# Patient Record
Sex: Female | Born: 1965 | Race: White | Hispanic: No | Smoking: Current every day smoker
Health system: Southern US, Community
[De-identification: ages and names within clinical notes are randomized; demographics above are authoritative.]

## PROBLEM LIST (undated history)

## (undated) DIAGNOSIS — F419 Anxiety disorder, unspecified: Secondary | ICD-10-CM

## (undated) DIAGNOSIS — M1990 Unspecified osteoarthritis, unspecified site: Secondary | ICD-10-CM

## (undated) DIAGNOSIS — R32 Unspecified urinary incontinence: Secondary | ICD-10-CM

## (undated) DIAGNOSIS — Z8679 Personal history of other diseases of the circulatory system: Secondary | ICD-10-CM

## (undated) DIAGNOSIS — E079 Disorder of thyroid, unspecified: Secondary | ICD-10-CM

## (undated) DIAGNOSIS — I10 Essential (primary) hypertension: Secondary | ICD-10-CM

## (undated) DIAGNOSIS — M797 Fibromyalgia: Secondary | ICD-10-CM

## (undated) DIAGNOSIS — E7800 Pure hypercholesterolemia, unspecified: Secondary | ICD-10-CM

## (undated) HISTORY — PX: ANTERIOR FUSION CERVICAL SPINE: SUR626

## (undated) HISTORY — PX: TUBAL LIGATION: SHX77

## (undated) HISTORY — PX: TONSILLECTOMY: SUR1361

## (undated) HISTORY — PX: BACK SURGERY: SHX140

## (undated) HISTORY — PX: CHOLECYSTECTOMY: SHX55

---

## 2012-12-18 MED ORDER — GUAIFENESIN-CODEINE 100-10 MG/5ML PO SYRP
100-10 MG/5ML | Freq: Four times a day (QID) | ORAL | Status: AC | PRN
Start: 2012-12-18 — End: 2012-12-25

## 2012-12-18 MED ORDER — BENZONATATE 100 MG PO CAPS
100 MG | ORAL_CAPSULE | Freq: Three times a day (TID) | ORAL | Status: AC | PRN
Start: 2012-12-18 — End: 2012-12-25

## 2012-12-18 NOTE — Progress Notes (Signed)
[facility] [place]  [address]  [place] [address]  Dept: [phone]  Dept Fax: [phone]    Tracy Ortega is a 47 y.o. female who presents to the urgent care today for her medical conditions/complaints as noted below.  [name] is c/o of Cough      HPI:     Cough  This is a new problem. The current episode started more than 1 month ago. The problem has been gradually worsening. The problem occurs constantly. The cough is productive of sputum. Associated symptoms include wheezing (tightness in chest). Associated symptoms comments: Fatigue, ear & sinus pressure, body aches. Risk factors for lung disease include smoking/tobacco exposure. She has tried OTC cough suppressant (albuterol breathing treatment) for the symptoms. The treatment provided mild relief. Her past medical history is significant for bronchitis, environmental allergies and pneumonia.       No past medical history on file.     Current Outpatient Prescriptions   Medication Sig Dispense Refill   ??? guaiFENesin-codeine (TUSSI-ORGANIDIN NR) 100-10 MG/5ML syrup Take 5 mLs by mouth 4 times daily as needed for Cough for up to 7 days.  280 mL  0   ??? benzonatate (TESSALON PERLES) 100 MG capsule Take 1 capsule by mouth 3 times daily as needed for Cough for up to 7 days.  30 capsule  0   ??? citalopram (CELEXA) 20 MG tablet Take 20 mg by mouth daily.       ??? Levothyroxine Sodium 75 MCG CAPS Take 1 capsule by mouth Daily.       ??? traMADol (ULTRAM) 50 MG tablet Take 50 mg by mouth every 8 hours as needed for Pain.       ??? fluticasone (FLONASE) 50 MCG/ACT nasal spray 1 spray by Nasal route daily.         No current facility-administered medications for this visit.     Allergies   Allergen Reactions   ??? Ancef [Cefazolin] Hives   ??? Demerol Hcl [Meperidine] Other (See Comments)     psycosis   ??? Seasonal Other (See Comments)     Sinus infection, sneezing   ??? Keflex [Cephalexin] Nausea And Vomiting              Subjective:      Review of Systems   Respiratory: Positive for cough and wheezing (tightness in chest).    Allergic/Immunologic: Positive for environmental allergies.   Cardiac: negative  HEENT: positive for congestion and runny nose.  Constitutional: Positive for myalgias      Objective:     Physical Exam   Constitutional: She is oriented to person, place, and time. She appears well-developed and well-nourished. No distress.   HENT:   Head: Normocephalic and atraumatic.   Nose: Mucosal edema and rhinorrhea present. No sinus tenderness or nasal deformity. No epistaxis. Right sinus exhibits no maxillary sinus tenderness and no frontal sinus tenderness. Left sinus exhibits no maxillary sinus tenderness and no frontal sinus tenderness.   Eyes: Conjunctivae and EOM are normal. Pupils are equal, round, and reactive to [place]. Right eye exhibits no discharge. Left eye exhibits no discharge. No scleral icterus.   Neck: Normal range of motion. Neck supple. No JVD present. No tracheal deviation present. No thyromegaly present.   Cardiovascular: Normal rate, regular rhythm and intact distal pulses.  Exam reveals no gallop and no friction rub.    No murmur heard.  Pulmonary/Chest: Effort normal and breath sounds normal. No respiratory  distress. She exhibits no tenderness.   Abdominal: Soft. Bowel sounds are normal. She exhibits no distension and no mass. There is no tenderness. There is no rebound.   Genitourinary: Vagina normal and uterus normal. No vaginal discharge found.   Musculoskeletal: Normal range of motion. She exhibits no edema or tenderness.   Lymphadenopathy:     She has no cervical adenopathy.   Neurological: She is alert and oriented to person, place, and time. She has normal reflexes. No cranial nerve deficit. She exhibits normal muscle tone.   Skin: Skin is warm and dry. No rash noted. No erythema.   Psychiatric: She has a normal mood and affect. Her behavior is normal. Thought content normal.   Nursing  note and vitals reviewed.    BP 100/62   Pulse 90   Temp(Src) 98.7 ??F (37.1 ??C) (Oral)   Ht 5' 2.5" (1.588 m)   Wt 140 lb (63.504 kg)   BMI 25.18 kg/m2   LMP 12/09/2012   Breastfeeding? No       Assessment:      1. Cough  XR Chest Standard TWO VW       Plan:      No Follow-up on file.    Orders Placed This Encounter   Medications   ??? guaiFENesin-codeine (TUSSI-ORGANIDIN NR) 100-10 MG/5ML syrup     Sig: Take 5 mLs by mouth 4 times daily as needed for Cough for up to 7 days.     Dispense:  280 mL     Refill:  0   ??? benzonatate (TESSALON PERLES) 100 MG capsule     Sig: Take 1 capsule by mouth 3 times daily as needed for Cough for up to 7 days.     Dispense:  30 capsule     Refill:  0       X ray negative, will send on to radiology for review.  Patient given educational materials - see patient instructions.  Discussed use, benefit, and side effects of prescribed medications.  All patient questions answered.  Pt voiced understanding.    Electronically signed by Hetty Ely, NP on 12/18/2012 at 8:08 PM

## 2013-11-17 MED ORDER — BENZONATATE 200 MG PO CAPS
200 MG | ORAL_CAPSULE | Freq: Three times a day (TID) | ORAL | Status: AC | PRN
Start: 2013-11-17 — End: 2013-11-24

## 2013-11-17 MED ORDER — AZITHROMYCIN 250 MG PO TABS
250 MG | PACK | ORAL | Status: AC
Start: 2013-11-17 — End: 2013-11-27

## 2013-11-17 MED ORDER — ALBUTEROL SULFATE (2.5 MG/3ML) 0.083% IN NEBU
Freq: Four times a day (QID) | RESPIRATORY_TRACT | Status: AC | PRN
Start: 2013-11-17 — End: ?

## 2013-11-17 MED ORDER — PREDNISONE 20 MG PO TABS
20 MG | ORAL_TABLET | ORAL | Status: AC
Start: 2013-11-17 — End: 2013-11-27

## 2013-11-17 NOTE — Progress Notes (Signed)
[facility] [place]  [address]  [place] [address]  Dept: [phone]  Dept Fax: [phone]    Tracy Ortega is a 48 y.o. female who presents to the urgent care today for her medical conditions/complaints as noted below.  Tracy Ortega is c/o of Sinusitis; and Cough      HPI:     Sinusitis  This is a new problem. The current episode started in the past 7 days. The problem has been gradually worsening since onset. The pain is mild. Associated symptoms include congestion, coughing, headaches, a hoarse voice, sinus pressure and a sore throat. Pertinent negatives include no chills, diaphoresis, ear pain, neck pain, shortness of breath, sneezing or swollen glands. Past treatments include oral decongestants. The treatment provided mild relief.   Cough  This is a new problem. The current episode started in the past 7 days. The problem has been gradually worsening. The problem occurs every few minutes. Associated symptoms include headaches, nasal congestion, postnasal drip and a sore throat. Pertinent negatives include no chest pain, chills, ear pain, fever, rash, rhinorrhea or shortness of breath. She has tried OTC cough suppressant for the symptoms. The treatment provided mild relief.       No past medical history on file.     Current Outpatient Prescriptions   Medication Sig Dispense Refill   ??? metoprolol (TOPROL-XL) 25 MG XL tablet Take 25 mg by mouth 2 times daily       ??? simvastatin (ZOCOR) 40 MG tablet Take 40 mg by mouth nightly       ??? aspirin 81 MG tablet Take 81 mg by mouth daily       ??? azithromycin (ZITHROMAX) 250 MG tablet 2 tablets now then 1 daily until gone.  1 packet  0   ??? benzonatate (TESSALON) 200 MG capsule Take 1 capsule by mouth 3 times daily as needed for Cough  20 capsule  0   ??? predniSONE (DELTASONE) 20 MG tablet 3 tabs x 3 days, then 2 tabs x 3 days, then 1 tab x 3 days  18 tablet  0   ??? albuterol (PROVENTIL) (2.5 MG/3ML) 0.083%  nebulizer solution Take 3 mLs by nebulization every 6 hours as needed for Wheezing or Shortness of Breath  120 vial  0   ??? citalopram (CELEXA) 20 MG tablet Take 20 mg by mouth daily.       ??? Levothyroxine Sodium 75 MCG CAPS Take 1 capsule by mouth Daily.       ??? traMADol (ULTRAM) 50 MG tablet Take 50 mg by mouth every 8 hours as needed for Pain.       ??? fluticasone (FLONASE) 50 MCG/ACT nasal spray 1 spray by Nasal route daily.         No current facility-administered medications for this visit.     Allergies   Allergen Reactions   ??? Ancef [Cefazolin] Hives   ??? Demerol Hcl [Meperidine] Other (See Comments)     psycosis   ??? Seasonal Other (See Comments)     Sinus infection, sneezing   ??? Keflex [Cephalexin] Nausea And Vomiting       Subjective:      Review of Systems   Constitutional: Negative for fever, chills, diaphoresis and fatigue.   HENT: Positive for congestion, hoarse voice, postnasal drip, sinus pressure and sore throat. Negative for ear discharge, ear pain, rhinorrhea and sneezing.    Eyes: Negative for discharge and itching.   Respiratory:  Positive for cough. Negative for chest tightness and shortness of breath.    Cardiovascular: Negative for chest pain, palpitations and leg swelling.   Gastrointestinal: Negative for nausea, vomiting, abdominal pain and diarrhea.   Genitourinary: Negative for dysuria and frequency.   Musculoskeletal: Negative for neck pain and neck stiffness.   Skin: Negative for rash.   Neurological: Positive for headaches. Negative for dizziness, weakness, Mehra-headedness and numbness.   All other systems reviewed and are negative.      Objective:     Physical Exam   Constitutional: She is oriented to person, place, and time. She appears well-developed and well-nourished. No distress.   BP 139/76 mmHg   Pulse 72   Temp(Src) 99 ??F (37.2 ??C) (Tympanic)   Resp 18   Ht  (1.575 m)   Wt 140 lb (63.504 kg)   BMI 25.60 kg/m2   LMP 12/09/2012 (Approximate)     HENT:   Head: Normocephalic and  atraumatic.   Right Ear: External ear normal.   Left Ear: External ear normal.   Nose: Nose normal.   Mouth/Throat: Oropharynx is clear and moist.   Eyes: Conjunctivae and EOM are normal. Pupils are equal, round, and reactive to [place]. Right eye exhibits no discharge. Left eye exhibits no discharge. No scleral icterus.   Neck: Normal range of motion. Neck supple. No tracheal deviation present. No thyromegaly present.   Cardiovascular: Normal rate, regular rhythm and normal heart sounds.  Exam reveals no gallop and no friction rub.    No murmur heard.  Pulmonary/Chest: Effort normal. No stridor. No respiratory distress. She has no wheezes. She has no rales. She exhibits no tenderness.   Decreased BS post   Abdominal: Soft. Bowel sounds are normal. She exhibits no distension. There is no tenderness. There is no rebound and no guarding.   Musculoskeletal: She exhibits no edema.   Neurological: She is alert and oriented to person, place, and time. Gait normal.   Skin: Skin is warm and dry. No rash noted. She is not diaphoretic.   Psychiatric: She has a normal mood and affect. Her affect is not inappropriate.   Nursing note and vitals reviewed.    BP 139/76 mmHg   Pulse 72   Temp(Src) 99 ??F (37.2 ??C) (Tympanic)   Resp 18   Ht  (1.575 m)   Wt 140 lb (63.504 kg)   BMI 25.60 kg/m2   LMP 12/09/2012 (Approximate)    Assessment:      1. COPD exacerbation (HCC)        Plan:      No Follow-up on file.    Orders Placed This Encounter   Medications   ??? azithromycin (ZITHROMAX) 250 MG tablet     Sig: 2 tablets now then 1 daily until gone.     Dispense:  1 packet     Refill:  0   ??? benzonatate (TESSALON) 200 MG capsule     Sig: Take 1 capsule by mouth 3 times daily as needed for Cough     Dispense:  20 capsule     Refill:  0   ??? predniSONE (DELTASONE) 20 MG tablet     Sig: 3 tabs x 3 days, then 2 tabs x 3 days, then 1 tab x 3 days     Dispense:  18 tablet     Refill:  0   ??? albuterol (PROVENTIL) (2.5 MG/3ML) 0.083% nebulizer  solution     Sig: Take 3 mLs by  nebulization every 6 hours as needed for Wheezing or Shortness of Breath     Dispense:  120 vial     Refill:  0          STOP SMOKING    Patient given educational materials - see patient instructions.  Discussed use, benefit, and side effects of prescribed medications.  All patient questions answered.  Pt voiced understanding.    Electronically signed by Lonny Prude, PA, PA-C on 11/17/2013 at 2:32 PM

## 2014-02-16 LAB — COMPREHENSIVE METABOLIC PANEL
ALT: 16 U/L (ref 5–33)
AST: 14 U/L (ref <32)
Albumin/Globulin Ratio: 1.6 (ref 1.0–2.5)
Albumin: 3.9 g/dL (ref 3.5–5.2)
Alkaline Phosphatase: 69 U/L (ref 35–104)
Anion Gap: 16 mmol/L (ref 9–17)
BUN: 8 mg/dL (ref 6–20)
CO2: 27 mmol/L (ref 20–31)
Calcium: 8.9 mg/dL (ref 8.6–10.4)
Chloride: 95 mmol/L — ABNORMAL LOW (ref 98–107)
Creatinine: 0.81 mg/dL (ref 0.50–0.90)
GFR African American: >60 mL/min (ref >60)
GFR Non-African American: >60 mL/min (ref >60)
Glucose: 81 mg/dL (ref 70–99)
Potassium: 4.5 mmol/L (ref 3.7–5.3)
Sodium: 138 mmol/L (ref 135–144)
Total Bilirubin: 0.35 mg/dL (ref 0.3–1.2)
Total Protein: 6.4 g/dL (ref 6.4–8.3)

## 2014-02-16 LAB — CBC
Hematocrit: 43.4 % (ref 36–46)
Hemoglobin: 14.4 g/dL (ref 12.0–16.0)
MCH: 34.7 pg — ABNORMAL HIGH (ref 26–34)
MCHC: 33.2 g/dL (ref 31–37)
MCV: 104.7 fL — ABNORMAL HIGH (ref 80–100)
MPV: 8.8 fL (ref 6.0–12.0)
Platelets: 321 10*3/uL (ref 140–450)
RBC: 4.15 m/uL (ref 4.0–5.2)
RDW: 14 % (ref 12.5–15.4)
WBC: 8.8 10*3/uL (ref 3.5–11.0)

## 2014-02-16 LAB — TSH WITH REFLEX: TSH: 2.06 mIU/L (ref 0.30–5.00)

## 2014-02-16 LAB — VITAMIN D 25 HYDROXY: Vit D, 25-Hydroxy: 12.5 ng/mL — ABNORMAL LOW (ref 30.0–100.0)

## 2014-03-12 LAB — LIPID PANEL
Chol/HDL Ratio: 3.2 (ref <5)
Cholesterol: 136 mg/dL (ref <200)
HDL: 42 mg/dL (ref >40)
LDL Cholesterol: 59 mg/dL (ref 0–130)
Triglycerides: 173 mg/dL — ABNORMAL HIGH (ref <150)

## 2014-03-12 LAB — CBC WITH AUTO DIFFERENTIAL
Absolute Eos #: 0.2 10*3/uL (ref 0.0–0.4)
Absolute Lymph #: 2.6 10*3/uL (ref 1.0–4.8)
Absolute Mono #: 0.5 10*3/uL (ref 0.1–1.2)
Basophils Absolute: 0 10*3/uL (ref 0.0–0.2)
Basophils: 0 % (ref 0–2)
Eosinophils %: 2 % (ref 1–4)
Hematocrit: 40.4 % (ref 36–46)
Hemoglobin: 13.4 g/dL (ref 12.0–16.0)
Lymphocytes: 27 % (ref 24–44)
MCH: 34 pg (ref 26–34)
MCHC: 33.3 g/dL (ref 31–37)
MCV: 101.9 fL — ABNORMAL HIGH (ref 80–100)
MPV: 9.2 fL (ref 6.0–12.0)
Monocytes: 5 % (ref 2–11)
Platelets: 332 10*3/uL (ref 140–450)
RBC: 3.96 m/uL — ABNORMAL LOW (ref 4.0–5.2)
RDW: 13.6 % (ref 12.5–15.4)
Seg Neutrophils: 66 % (ref 36–66)
Segs Absolute: 6.2 10*3/uL (ref 1.8–7.7)
WBC: 9.6 10*3/uL (ref 3.5–11.0)

## 2014-03-12 LAB — SURGICAL PATHOLOGY

## 2014-03-12 LAB — SEDIMENTATION RATE: Sed Rate: 17 mm (ref 0–20)

## 2014-03-12 LAB — RHEUMATOID FACTOR: Rheumatoid Factor: <10 IU/mL (ref <14)

## 2014-03-12 LAB — RETICULOCYTES
Absolute Retic #: 0.078 M/uL (ref 0.0245–0.098)
Retic %: 2 % (ref 0.5–2.0)

## 2014-03-12 LAB — ANA

## 2014-03-12 LAB — VITAMIN B12: Vitamin B-12: 282 pg/mL (ref 211–946)

## 2014-03-12 LAB — TRANSFERRIN: Transferrin: 284 mg/dL (ref 200–360)

## 2014-03-12 LAB — PERIPHERAL BLOOD SMEAR, PATH REVIEW

## 2014-03-12 LAB — FOLATE: Folate: 5.9 ng/mL (ref >4.7)

## 2014-04-09 LAB — ANA

## 2014-04-09 LAB — T4, FREE: Thyroxine, Free: 1.23 ng/dL (ref 0.93–1.70)

## 2014-04-09 LAB — TSH WITH REFLEX: TSH: 6.9 mIU/L — ABNORMAL HIGH (ref 0.30–5.00)

## 2014-07-16 DIAGNOSIS — R059 Cough, unspecified: Secondary | ICD-10-CM | POA: Insufficient documentation

## 2014-07-16 DIAGNOSIS — R5382 Chronic fatigue, unspecified: Secondary | ICD-10-CM | POA: Insufficient documentation

## 2014-07-16 DIAGNOSIS — R768 Other specified abnormal immunological findings in serum: Secondary | ICD-10-CM | POA: Insufficient documentation

## 2014-07-16 DIAGNOSIS — M2550 Pain in unspecified joint: Secondary | ICD-10-CM | POA: Insufficient documentation

## 2014-09-15 LAB — LYME AB: Lyme Ab: 0.44 (ref <0.91)

## 2016-01-19 ENCOUNTER — Emergency Department
Admission: EM | Admit: 2016-01-19 | Discharge: 2016-01-19 | Disposition: A | Discharge status: Other Acute Inpatient Hospital | Payer: 59 | Attending: Emergency Medicine | Admitting: Emergency Medicine

## 2016-01-19 ENCOUNTER — Inpatient Hospital Stay
Admission: EM | Admit: 2016-01-19 | Discharge: 2016-02-07 | DRG: 885 | Disposition: A | Discharge status: Home / Self Care | Payer: No Typology Code available for payment source | Source: Intra-hospital | Attending: Psychiatry | Admitting: Psychiatry

## 2016-01-19 ENCOUNTER — Encounter: Payer: Self-pay | Admitting: *Deleted

## 2016-01-19 DIAGNOSIS — Z5181 Encounter for therapeutic drug level monitoring: Secondary | ICD-10-CM | POA: Diagnosis not present

## 2016-01-19 DIAGNOSIS — I10 Essential (primary) hypertension: Secondary | ICD-10-CM | POA: Insufficient documentation

## 2016-01-19 DIAGNOSIS — E785 Hyperlipidemia, unspecified: Secondary | ICD-10-CM | POA: Diagnosis present

## 2016-01-19 DIAGNOSIS — F3163 Bipolar disorder, current episode mixed, severe, without psychotic features: Secondary | ICD-10-CM | POA: Diagnosis not present

## 2016-01-19 DIAGNOSIS — I2510 Atherosclerotic heart disease of native coronary artery without angina pectoris: Secondary | ICD-10-CM | POA: Diagnosis present

## 2016-01-19 DIAGNOSIS — F1020 Alcohol dependence, uncomplicated: Secondary | ICD-10-CM

## 2016-01-19 DIAGNOSIS — F17200 Nicotine dependence, unspecified, uncomplicated: Secondary | ICD-10-CM

## 2016-01-19 DIAGNOSIS — E538 Deficiency of other specified B group vitamins: Secondary | ICD-10-CM | POA: Diagnosis present

## 2016-01-19 DIAGNOSIS — E039 Hypothyroidism, unspecified: Secondary | ICD-10-CM | POA: Diagnosis present

## 2016-01-19 DIAGNOSIS — G4700 Insomnia, unspecified: Secondary | ICD-10-CM | POA: Diagnosis present

## 2016-01-19 DIAGNOSIS — F419 Anxiety disorder, unspecified: Secondary | ICD-10-CM | POA: Diagnosis present

## 2016-01-19 DIAGNOSIS — N390 Urinary tract infection, site not specified: Secondary | ICD-10-CM | POA: Diagnosis present

## 2016-01-19 DIAGNOSIS — M549 Dorsalgia, unspecified: Secondary | ICD-10-CM | POA: Diagnosis present

## 2016-01-19 DIAGNOSIS — F29 Unspecified psychosis not due to a substance or known physiological condition: Secondary | ICD-10-CM | POA: Diagnosis not present

## 2016-01-19 DIAGNOSIS — Z79899 Other long term (current) drug therapy: Secondary | ICD-10-CM

## 2016-01-19 DIAGNOSIS — F10239 Alcohol dependence with withdrawal, unspecified: Secondary | ICD-10-CM | POA: Diagnosis present

## 2016-01-19 DIAGNOSIS — F17210 Nicotine dependence, cigarettes, uncomplicated: Secondary | ICD-10-CM | POA: Diagnosis present

## 2016-01-19 DIAGNOSIS — F1120 Opioid dependence, uncomplicated: Secondary | ICD-10-CM | POA: Diagnosis present

## 2016-01-19 DIAGNOSIS — Z7982 Long term (current) use of aspirin: Secondary | ICD-10-CM

## 2016-01-19 DIAGNOSIS — F1210 Cannabis abuse, uncomplicated: Secondary | ICD-10-CM | POA: Diagnosis present

## 2016-01-19 DIAGNOSIS — F250 Schizoaffective disorder, bipolar type: Secondary | ICD-10-CM | POA: Diagnosis present

## 2016-01-19 DIAGNOSIS — G8929 Other chronic pain: Secondary | ICD-10-CM | POA: Diagnosis present

## 2016-01-19 DIAGNOSIS — F10939 Alcohol use, unspecified with withdrawal, unspecified: Secondary | ICD-10-CM

## 2016-01-19 DIAGNOSIS — E781 Pure hyperglyceridemia: Secondary | ICD-10-CM | POA: Diagnosis present

## 2016-01-19 DIAGNOSIS — R451 Restlessness and agitation: Secondary | ICD-10-CM | POA: Diagnosis present

## 2016-01-19 DIAGNOSIS — Z01818 Encounter for other preprocedural examination: Secondary | ICD-10-CM | POA: Diagnosis present

## 2016-01-19 HISTORY — DX: Anxiety disorder, unspecified: F41.9

## 2016-01-19 HISTORY — DX: Personal history of other diseases of the circulatory system: Z86.79

## 2016-01-19 HISTORY — DX: Disorder of thyroid, unspecified: E07.9

## 2016-01-19 HISTORY — DX: Unspecified urinary incontinence: R32

## 2016-01-19 HISTORY — DX: Essential (primary) hypertension: I10

## 2016-01-19 LAB — COMPREHENSIVE METABOLIC PANEL
ALT: 25 U/L (ref 14–54)
ANION GAP: 11 (ref 5–15)
AST: 37 U/L (ref 15–41)
Albumin: 4.2 g/dL (ref 3.5–5.0)
Alkaline Phosphatase: 77 U/L (ref 38–126)
BUN: <5 mg/dL — ABNORMAL LOW (ref 6–20)
CHLORIDE: 100 mmol/L — AB (ref 101–111)
CO2: 27 mmol/L (ref 22–32)
CREATININE: 1.12 mg/dL — AB (ref 0.44–1.00)
Calcium: 9.3 mg/dL (ref 8.9–10.3)
GFR, EST NON AFRICAN AMERICAN: 56 mL/min — AB (ref >60)
Glucose, Bld: 137 mg/dL — ABNORMAL HIGH (ref 65–99)
POTASSIUM: 4.7 mmol/L (ref 3.5–5.1)
SODIUM: 138 mmol/L (ref 135–145)
Total Bilirubin: 0.7 mg/dL (ref 0.3–1.2)
Total Protein: 7.4 g/dL (ref 6.5–8.1)

## 2016-01-19 LAB — URINALYSIS COMPLETE WITH MICROSCOPIC (ARMC ONLY)
Bilirubin Urine: NEGATIVE
Glucose, UA: NEGATIVE mg/dL
KETONES UR: NEGATIVE mg/dL
LEUKOCYTES UA: NEGATIVE
NITRITE: POSITIVE — AB
PROTEIN: NEGATIVE mg/dL
SPECIFIC GRAVITY, URINE: 1.009 (ref 1.005–1.030)
pH: 6 (ref 5.0–8.0)

## 2016-01-19 LAB — CBC
HCT: 45.5 % (ref 35.0–47.0)
Hemoglobin: 15.8 g/dL (ref 12.0–16.0)
MCH: 35.6 pg — AB (ref 26.0–34.0)
MCHC: 34.6 g/dL (ref 32.0–36.0)
MCV: 102.9 fL — AB (ref 80.0–100.0)
PLATELETS: 346 10*3/uL (ref 150–440)
RBC: 4.42 MIL/uL (ref 3.80–5.20)
RDW: 14.8 % — AB (ref 11.5–14.5)
WBC: 15.7 10*3/uL — ABNORMAL HIGH (ref 3.6–11.0)

## 2016-01-19 LAB — URINE DRUG SCREEN, QUALITATIVE (ARMC ONLY)
AMPHETAMINES, UR SCREEN: NOT DETECTED
Barbiturates, Ur Screen: NOT DETECTED
Benzodiazepine, Ur Scrn: NOT DETECTED
CANNABINOID 50 NG, UR ~~LOC~~: POSITIVE — AB
COCAINE METABOLITE, UR ~~LOC~~: NOT DETECTED
MDMA (ECSTASY) UR SCREEN: NOT DETECTED
METHADONE SCREEN, URINE: NOT DETECTED
Opiate, Ur Screen: NOT DETECTED
Phencyclidine (PCP) Ur S: NOT DETECTED
TRICYCLIC, UR SCREEN: NOT DETECTED

## 2016-01-19 LAB — ACETAMINOPHEN LEVEL: Acetaminophen (Tylenol), Serum: <10 ug/mL — ABNORMAL LOW (ref 10–30)

## 2016-01-19 LAB — SALICYLATE LEVEL: Salicylate Lvl: <7 mg/dL (ref 2.8–30.0)

## 2016-01-19 LAB — ETHANOL

## 2016-01-19 MED ORDER — HYDROXYZINE HCL 25 MG PO TABS
25.0000 mg | ORAL_TABLET | Freq: Three times a day (TID) | ORAL | Status: DC | PRN
  Administered 2016-01-19 – 2016-01-20 (×2): 25 mg via ORAL
  Filled 2016-01-19 (×2): qty 1

## 2016-01-19 MED ORDER — DIPHENHYDRAMINE HCL 25 MG PO CAPS: 25.0000 mg | ORAL_CAPSULE | Freq: Four times a day (QID) | ORAL | Status: DC | PRN

## 2016-01-19 MED ORDER — PALIPERIDONE ER 3 MG PO TB24
6.0000 mg | ORAL_TABLET | Freq: Every day | ORAL | Status: DC
  Administered 2016-01-19 – 2016-01-23 (×5): 6 mg via ORAL
  Filled 2016-01-19 (×5): qty 2

## 2016-01-19 MED ORDER — FOLIC ACID 1 MG PO TABS
1.0000 mg | ORAL_TABLET | Freq: Every day | ORAL | Status: DC
  Administered 2016-01-20: 1 mg via ORAL
  Filled 2016-01-19: qty 1

## 2016-01-19 MED ORDER — SIMVASTATIN 20 MG PO TABS
20.0000 mg | ORAL_TABLET | Freq: Every day | ORAL | Status: DC
  Administered 2016-01-20 – 2016-02-06 (×17): 20 mg via ORAL
  Filled 2016-01-19 (×17): qty 1

## 2016-01-19 MED ORDER — IBUPROFEN 400 MG PO TABS
400.0000 mg | ORAL_TABLET | Freq: Four times a day (QID) | ORAL | Status: DC | PRN
  Administered 2016-01-19 – 2016-02-02 (×22): 400 mg via ORAL
  Filled 2016-01-19 (×22): qty 1

## 2016-01-19 MED ORDER — ASPIRIN EC 81 MG PO TBEC
81.0000 mg | DELAYED_RELEASE_TABLET | Freq: Every day | ORAL | Status: DC
  Administered 2016-01-20 – 2016-02-07 (×20): 81 mg via ORAL
  Filled 2016-01-19 (×19): qty 1

## 2016-01-19 MED ORDER — CITALOPRAM HYDROBROMIDE 20 MG PO TABS
20.0000 mg | ORAL_TABLET | Freq: Every day | ORAL | Status: DC
  Administered 2016-01-20: 20 mg via ORAL
  Filled 2016-01-19: qty 1

## 2016-01-19 MED ORDER — ALUM & MAG HYDROXIDE-SIMETH 200-200-20 MG/5ML PO SUSP
30.0000 mL | ORAL | Status: DC | PRN
  Administered 2016-01-22 – 2016-02-05 (×6): 30 mL via ORAL
  Filled 2016-01-19 (×6): qty 30

## 2016-01-19 MED ORDER — ADULT MULTIVITAMIN W/MINERALS CH
1.0000 | ORAL_TABLET | Freq: Every day | ORAL | Status: DC
  Administered 2016-01-20: 1 via ORAL
  Filled 2016-01-19: qty 1

## 2016-01-19 MED ORDER — ACETAMINOPHEN 325 MG PO TABS
650.0000 mg | ORAL_TABLET | Freq: Four times a day (QID) | ORAL | Status: DC | PRN
  Administered 2016-02-01 – 2016-02-06 (×2): 650 mg via ORAL
  Filled 2016-01-19 (×2): qty 2

## 2016-01-19 MED ORDER — LORAZEPAM 1 MG PO TABS
1.0000 mg | ORAL_TABLET | Freq: Once | ORAL | Status: AC
  Administered 2016-01-19: 1 mg via ORAL
  Filled 2016-01-19: qty 1

## 2016-01-19 MED ORDER — LORAZEPAM 1 MG PO TABS
1.0000 mg | ORAL_TABLET | Freq: Four times a day (QID) | ORAL | Status: DC | PRN
  Administered 2016-01-20: 1 mg via ORAL
  Filled 2016-01-19: qty 1

## 2016-01-19 MED ORDER — ASPIRIN EC 81 MG PO TBEC
81.0000 mg | DELAYED_RELEASE_TABLET | Freq: Every day | ORAL | Status: DC
  Administered 2016-01-19: 81 mg via ORAL
  Filled 2016-01-19: qty 1

## 2016-01-19 MED ORDER — MAGNESIUM HYDROXIDE 400 MG/5ML PO SUSP: 30.0000 mL | Freq: Every day | ORAL | Status: DC | PRN

## 2016-01-19 MED ORDER — TRAZODONE HCL 100 MG PO TABS
100.0000 mg | ORAL_TABLET | Freq: Every evening | ORAL | Status: DC | PRN
  Administered 2016-01-20 – 2016-02-05 (×14): 100 mg via ORAL
  Filled 2016-01-19 (×16): qty 1

## 2016-01-19 MED ORDER — SULFAMETHOXAZOLE-TRIMETHOPRIM 800-160 MG PO TABS
1.0000 | ORAL_TABLET | Freq: Two times a day (BID) | ORAL | Status: DC
  Administered 2016-01-19 – 2016-01-20 (×2): 1 via ORAL
  Filled 2016-01-19 (×2): qty 1

## 2016-01-19 MED ORDER — LORAZEPAM 2 MG/ML IJ SOLN: 1.0000 mg | Freq: Four times a day (QID) | INTRAMUSCULAR | Status: DC | PRN

## 2016-01-19 MED ORDER — CITALOPRAM HYDROBROMIDE 20 MG PO TABS
20.0000 mg | ORAL_TABLET | Freq: Every day | ORAL | Status: DC
  Administered 2016-01-19: 20 mg via ORAL
  Filled 2016-01-19: qty 1

## 2016-01-19 MED ORDER — THIAMINE HCL 100 MG/ML IJ SOLN: 100.0000 mg | Freq: Every day | INTRAMUSCULAR | Status: DC

## 2016-01-19 MED ORDER — PALIPERIDONE ER 6 MG PO TB24
6.0000 mg | ORAL_TABLET | Freq: Every day | ORAL | Status: DC
  Filled 2016-01-19: qty 1

## 2016-01-19 MED ORDER — VITAMIN B-1 100 MG PO TABS
100.0000 mg | ORAL_TABLET | Freq: Every day | ORAL | Status: DC
  Administered 2016-01-20: 100 mg via ORAL
  Filled 2016-01-19: qty 1

## 2016-01-19 MED ORDER — SIMVASTATIN 20 MG PO TABS
20.0000 mg | ORAL_TABLET | Freq: Every day | ORAL | Status: DC
  Filled 2016-01-19: qty 1

## 2016-01-19 NOTE — ED Provider Notes (Signed)
[facility] Emergency Department Provider Note  ____________________________________________  Time seen: Approximately 2:40 PM  I have reviewed the triage vital signs and the nursing notes.   HISTORY  Chief Complaint Medical Clearance   HPI Catherine Lester is a 50 y.o. female h/o PTSD and depression who presents with police for auditory hallucinations and abnormal behavior. Patient with disorganized thought process, tells me that she is here to help her daughter who kicked her out of the house and she has been living in a motel. She does not know who long she has been there. She has been hearing voices and reports that this has happened to her in the past. She says the voices tell her "to follow my gut instinct". She also tells me "I have a great sixth sense and I can fell what is going on". She denies SI and reports prior h/o SI. Denies prior psych hospitalization. She is from [place]. She endorses alcohol use but is unable to tell me how much or how often. When I asked her what meds she was on, she started to list her meds and then said "someone over there said I am on ativan, but I am not" and pointed to her left side as if someone was there. She also endorses MJ use but denies other drugs.   Past Medical History:  Diagnosis Date  . Anxiety   . Hx of heart valve insufficiency   . Hypertension   . Thyroid disease   . Urinary incontinence     There are no active problems to display for this patient.   Past Surgical History:  Procedure Laterality Date  . ANTERIOR FUSION CERVICAL SPINE     C5, C6  . BACK SURGERY    . TONSILLECTOMY    . TUBAL LIGATION      Prior to Admission medications   Not on File    Allergies Ancef [cefazolin]; Ceclor [cefaclor]; Demerol [meperidine]; and Keflex [cephalexin]  No family history on file.  Social History Social History  Substance Use Topics  . Smoking status: Current Every Day Smoker  . Smokeless tobacco:  Never Used  . Alcohol use Yes     Comment: occasional    Review of Systems  Constitutional: Negative for fever. Eyes: Negative for visual changes. ENT: Negative for sore throat. Cardiovascular: Negative for chest pain. Respiratory: Negative for shortness of breath. Gastrointestinal: Negative for abdominal pain, vomiting or diarrhea. Genitourinary: Negative for dysuria. Musculoskeletal: Negative for back pain. Skin: Negative for rash. Neurological: Negative for headaches, weakness or numbness. Psych: + AH, denies SI  ____________________________________________   PHYSICAL EXAM:  VITAL SIGNS: ED Triage Vitals  Enc Vitals Group     BP 01/19/16 1415 (!) 168/85     Pulse Rate 01/19/16 1415 (!) 110     Resp 01/19/16 1415 18     Temp 01/19/16 1415 98.4 F (36.9 C)     Temp Source 01/19/16 1415 Oral     SpO2 01/19/16 1415 96 %     Weight 01/19/16 1413 183 lb (83 kg)     Height 01/19/16 1413 5\' 2"  (1.575 m)     Head Circumference --      Peak Flow --      Pain Score --      Pain Loc --      Pain Edu? --      Excl. in GC? --     Constitutional: Alert and oriented. Well appearing and in no apparent distress.  HEENT:      Head: Normocephalic and atraumatic.         Eyes: Conjunctivae are normal. Sclera is non-icteric. EOMI. PERRL      Mouth/Throat: Mucous membranes are moist.       Neck: Supple with no signs of meningismus. Cardiovascular: Regular rate and rhythm. No murmurs, gallops, or rubs. 2+ symmetrical distal pulses are present in all extremities. No JVD. Respiratory: Normal respiratory effort. Lungs are clear to auscultation bilaterally. No wheezes, crackles, or rhonchi.  Gastrointestinal: Soft, non tender, and non distended with positive bowel sounds. No rebound or guarding. Genitourinary: No CVA tenderness. Musculoskeletal: Nontender with normal range of motion in all extremities. No edema, cyanosis, or erythema of extremities. Neurologic: Normal speech and  language. Face is symmetric. Moving all extremities. No gross focal neurologic deficits are appreciated. Skin: Skin is warm, dry and intact. No rash noted. Psychiatric: Disorganized thought process, auditory hallucinations, no SI   ____________________________________________   LABS (all labs ordered are listed, but only abnormal results are displayed)  Labs Reviewed  CBC  COMPREHENSIVE METABOLIC PANEL  ETHANOL  URINE DRUG SCREEN, QUALITATIVE ([facility] ONLY)  URINALYSIS COMPLETEWITH MICROSCOPIC ([facility] ONLY)  SALICYLATE LEVEL  ACETAMINOPHEN LEVEL   ____________________________________________  EKG  none  ____________________________________________  RADIOLOGY  none  ____________________________________________   PROCEDURES  Procedure(s) performed: None Procedures Critical Care performed:  None ____________________________________________   INITIAL IMPRESSION / ASSESSMENT AND PLAN / ED COURSE  50 y.o. female h/o PTSD and depression who presents with police for auditory hallucinations and abnormal behavior. Patient with active psychosis at this time. Will initiate IVC paperwork. Will check labs, tox screen, and consult psych.   Clinical Course    Pertinent labs & imaging results that were available during my care of the patient were reviewed by me and considered in my medical decision making (see chart for details).    ____________________________________________   FINAL CLINICAL IMPRESSION(S) / ED DIAGNOSES  Final diagnoses:  Psychosis, unspecified psychosis type      NEW MEDICATIONS STARTED DURING THIS VISIT:  New Prescriptions   No medications on file     Note:  This document was prepared using Dragon voice recognition software and may include unintentional dictation errors.    Nita Sicklearolina Magdaline Zollars, MD 01/20/16 [phone]

## 2016-01-19 NOTE — BH Assessment (Signed)
Assessment Note  Catherine Lester is an 50 y.o. female who presents to the ER via law enforcement from local motel, because she was having odd and bizarre behaviors.  Patient admits to A/H. Patient also admits to using alcohol and cannabis. Per her report, she came to [place], from [place] to help her daughter [name]).  Patient denies history of Psych Admissions and outpatient treatment. Patient was a poor historian and was unable to provide much information. During the interview, she was having thought blocking and at times became confused. During the interview, the patient was calm, polite and pleasant.  Per the report of the patient's mother Consuella Lose Fuller-[phone]), she's had great deal of stressors in the last several months. She confirmed the patient came to [place] to help her daughter's current living arrangements and with her current boyfriend. She was suppose return this past Friday (01/14/2016) but decided to stay a few more days with her daughter. Mother reports of having limited information about what happened with the daughter and the patient.   Patient came to [facility], approximately a month ago. Mother has talked with her on a daily basis, except for today (01/19/2016). Patient is the primary care giver for both of her parents and her fraternal aunt who is handicap. Patient was a LPN for approximately 30 years. Mother confirmed the patient has had no psych inpatient admissions. She was unsure of her outpatient treatment.  Patient denies HI and V/H.  Diagnosis: Bipolar  Past Medical History:  Past Medical History:  Diagnosis Date  . Anxiety   . Hx of heart valve insufficiency   . Hypertension   . Thyroid disease   . Urinary incontinence     Past Surgical History:  Procedure Laterality Date  . ANTERIOR FUSION CERVICAL SPINE     C5, C6  . BACK SURGERY    . TONSILLECTOMY    . TUBAL LIGATION      Family History: No family history on file.  Social  History:  reports that she has been smoking.  She has never used smokeless tobacco. She reports that she drinks alcohol. She reports that she uses drugs, including Marijuana.  Additional Social History:  Alcohol / Drug Use Pain Medications: See PTA Prescriptions: See PTA Over the Counter: See PTA History of alcohol / drug use?: Yes Longest period of sobriety (when/how long): Unknown Negative Consequences of Use: Financial (None Reported) Withdrawal Symptoms:  (None Reported) Substance #1 Name of Substance 1: Cannabis Substance #2 Name of Substance 2: Alcohol  CIWA: CIWA-Ar BP: (!) 168/85 Pulse Rate: (!) 110 COWS:    Allergies:  Allergies  Allergen Reactions  . Ancef [Cefazolin] Hives  . Ceclor [Cefaclor] Hives  . Demerol [Meperidine] Other (See Comments)    insomnia  . Keflex [Cephalexin] Rash    Home Medications:  (Not in a hospital admission)  OB/GYN Status:  No LMP recorded. Patient is not currently having periods (Reason: Perimenopausal).  General Assessment Data Location of Assessment: [facility] ED TTS Assessment: In system Is this a Tele or Face-to-Face Assessment?: Face-to-Face Is this an Initial Assessment or a Re-assessment for this encounter?: Initial Assessment Marital status: Married [place] name: n/a Is patient pregnant?: No Pregnancy Status: No Living Arrangements: Spouse/significant other, Children Can pt return to current living arrangement?: Yes Admission Status: Involuntary Is patient capable of signing voluntary admission?: No Referral Source: Self/Family/Friend [place]) Insurance type: Unknown  Medical Screening Exam ([facility] Walk-in ONLY) Medical Exam completed: Yes  Crisis Care Plan Living Arrangements:  Spouse/significant other, Children Legal Guardian: Other: (None) Name of Psychiatrist: Unknown Name of Therapist: Unknown  Education Status Is patient currently in school?: No Current Grade: n/a Highest grade of school patient has  completed: Unknown Name of school: n/a Contact person: n/a  Risk to self with the past 6 months Suicidal Ideation: No Has patient been a risk to self within the past 6 months prior to admission? : No Suicidal Intent: No Has patient had any suicidal intent within the past 6 months prior to admission? : No Is patient at risk for suicide?: No Suicidal Plan?: No Has patient had any suicidal plan within the past 6 months prior to admission? : No Access to Means: No What has been your use of drugs/alcohol within the last 12 months?: Alcohol & Cannabis Previous Attempts/Gestures: No How many times?: 0 Other Self Harm Risks: Reports of none Triggers for Past Attempts: Unknown Intentional Self Injurious Behavior: None Family Suicide History: No Recent stressful life event(s): Other (Comment) Persecutory voices/beliefs?: No Depression: Yes Depression Symptoms: Fatigue, Isolating, Feeling worthless/self pity Substance abuse history and/or treatment for substance abuse?: Yes Suicide prevention information given to non-admitted patients: Not applicable  Risk to Others within the past 6 months Homicidal Ideation: No Does patient have any lifetime risk of violence toward others beyond the six months prior to admission? : No Thoughts of Harm to Others: No Current Homicidal Intent: No Current Homicidal Plan: No Access to Homicidal Means: No Identified Victim: Reports of none History of harm to others?: No Violent Behavior Description: Reports of none Does patient have access to weapons?: No Does patient have a court date: No Is patient on probation?: No  Psychosis Hallucinations: Auditory Delusions: None noted  Mental Status Report Appearance/Hygiene: Bizarre Eye Contact: Fair Motor Activity: Freedom of movement, Unremarkable Speech: Soft, Slurred, Slow Level of Consciousness: Alert Mood: Labile, Pleasant, Helpless Affect: Labile, Anxious Anxiety Level: Minimal Thought Processes:  Thought Blocking, Flight of Ideas, Relevant Judgement: Partial Orientation: Person, Situation, Appropriate for developmental age Obsessive Compulsive Thoughts/Behaviors: Minimal  Cognitive Functioning Concentration: Decreased Memory: Recent Impaired, Remote Intact IQ: Average Insight: Poor Impulse Control: Poor Appetite: Fair Weight Loss: 0 Weight Gain: 0 Sleep: Unable to Assess Total Hours of Sleep: 0 (Unknown) Vegetative Symptoms: None  ADLScreening [facility] Assessment Services) Patient's cognitive ability adequate to safely complete daily activities?: Yes Patient able to express need for assistance with ADLs?: Yes Independently performs ADLs?: Yes (appropriate for developmental age)  Prior Inpatient Therapy Prior Inpatient Therapy: No Prior Therapy Dates: Reports of none Prior Therapy Facilty/Provider(s): Reports of none Reason for Treatment: Reports of none  Prior Outpatient Therapy Prior Outpatient Therapy: No Prior Therapy Dates: Reports of none Prior Therapy Facilty/Provider(s): Reports of none Reason for Treatment: Reports of none Does patient have an ACCT team?: No Does patient have Intensive In-House Services?  : No Does patient have [facility] services? : No Does patient have P4CC services?: No  ADL Screening (condition at time of admission) Patient's cognitive ability adequate to safely complete daily activities?: Yes Is the patient deaf or have difficulty hearing?: No Does the patient have difficulty seeing, even when wearing glasses/contacts?: No Does the patient have difficulty concentrating, remembering, or making decisions?: No Patient able to express need for assistance with ADLs?: Yes Does the patient have difficulty dressing or bathing?: No Independently performs ADLs?: Yes (appropriate for developmental age) Does the patient have difficulty walking or climbing stairs?: No Weakness of Legs: None Weakness of Arms/Hands: None  Home Assistive  Devices/Equipment Home  Assistive Devices/Equipment: None  Therapy Consults (therapy consults require a physician order) PT Evaluation Needed: No OT Evalulation Needed: No SLP Evaluation Needed: No Abuse/Neglect Assessment (Assessment to be complete while patient is alone) Physical Abuse: Denies Verbal Abuse: Denies Sexual Abuse: Denies Exploitation of patient/patient's resources: Denies Self-Neglect: Denies Values / Beliefs Cultural Requests During Hospitalization: None Spiritual Requests During Hospitalization: None Consults Spiritual Care Consult Needed: No Social Work Consult Needed: No Merchant navy officerAdvance Directives (For Healthcare) Does patient have an advance directive?: No    Additional Information 1:1 In Past 12 Months?: No CIRT Risk: No Elopement Risk: No Does patient have medical clearance?: Yes  Child/Adolescent Assessment Running Away Risk: Denies (Patient is an adult)  Disposition:  Disposition Initial Assessment Completed for this Encounter: Yes Disposition of Patient: Other dispositions (ER MD ordered Psych Consult)  On Site Evaluation by:   Reviewed with Physician:    Lilyan Gilfordalvin J. Annaliese Saez MS, LCAS, LPC, NCC, CCSI Therapeutic Triage Specialist 01/19/2016 4:28 PM

## 2016-01-19 NOTE — ED Notes (Signed)
The money in the patient's wallet was counted in front of the patient and ODS Marcheta GrammesSargeant Brown. Sargeant Brown placed wallet and money and credit cards in an envelope, sealed it and took it to the hospital's safe.

## 2016-01-19 NOTE — Consult Note (Signed)
[facility] Consult   Reason for Consult:  Consult for 50 year old woman brought in by police after disruptive behavior in public Referring Physician:  Dorathy Daft Patient Identification: Catherine Lester MRN:  [id number] Principal Diagnosis: Bipolar 1 disorder, mixed, severe ([place]) Diagnosis:   Patient Active Problem List   Diagnosis Date Noted  . Bipolar 1 disorder, mixed, severe ([place]) [F31.63] 01/19/2016  . Alcohol abuse [F10.10] 01/19/2016  . Marijuana abuse [F12.10] 01/19/2016    Total Time spent with patient: 1 hour  Subjective:   Catherine Lester is a 50 y.o. female patient admitted with "things of been terrible".  HPI:  Patient interviewed. Chart reviewed. Labs reviewed. Case discussed with TTS and emergency room physician. 50 year old woman brought to the emergency room by police after they were called to a local motel regarding some kind of disruptive behavior. Patient was very limited in her ability to give history. She was able to tell us the name of her daughter but not her daughter's phone number and we don't have any other contact information. Patient says that she lives in [place] and 3 weeks ago drove down here to [place] to "help" her daughter. She says that since she's been here things have been "terrible". She can't describe exactly what she means by that. Evidently at some point she left her daughter and went to stay in a motel. Patient is very disorganized about any other details of the history. She says she was taking psychiatric medicine up in [place] although she can't remember what it was. She does not think she's been taking her medicine regularly since coming down here. She says that she has been drinking "a lot". I ask her to specify and she said it's been several of the canned mixed drinks a day. She says she also smokes marijuana but is vague about how much of that. Denies other drug use. Patient says she has been sleeping very poorly. Appetite has  been poor. Thoughts of been confused. She has been having auditory hallucinations. She was not able to describe them or give me any other details.  Medical history: Patient apparently has a chronic pain problem and dyslipidemia. Couldn't give me much other detail about medical history. Thyroid disease is listed in her list of problems but she didn't mention being on thyroid medicine. Denies any history of heart attack.  Substance abuse history: Says that she drinks and sometimes drinks a lot. This is another area she is vague about. Denies ever having had seizures or hallucinations from withdrawal. She uses marijuana regularly but no other drugs.  Social history: Patient said that she was living with her mother and some other relatives up in [place]. She says she was "supposed to" be receiving disability. She hasn't been able to work is at least several years. She mentions having a son but does not know where he is. Apparently came down here to be with her daughter but we don't have enough information to contact her.  Past Psychiatric History: Patient clearly has had mental health problems in the past but she can't remember a diagnosis. When I mentioned the words bipolar disorder however she seemed to connect with that. She denied to me ever being in a psychiatric hospital. Denied any history of suicide attempts. Said that she has been aggressive in the past but only when she needed to "defend herself".  Risk to Self: Is patient at risk for suicide?: No Risk to Others:   Prior Inpatient Therapy:   Prior Outpatient Therapy:  Past Medical History:  Past Medical History:  Diagnosis Date  . Anxiety   . Hx of heart valve insufficiency   . Hypertension   . Thyroid disease   . Urinary incontinence     Past Surgical History:  Procedure Laterality Date  . ANTERIOR FUSION CERVICAL SPINE     C5, C6  . BACK SURGERY    . TONSILLECTOMY    . TUBAL LIGATION     Family History: No family history on  file. Family Psychiatric  History: Patient says that she thinks there is a family history of mental illness but again she can't come up with any details. Social History:  History  Alcohol Use  . Yes    Comment: occasional     History  Drug Use  . Types: Marijuana    Social History   Social History  . Marital status: Single    Spouse name: N/A  . Number of children: N/A  . Years of education: N/A   Social History Main Topics  . Smoking status: Current Every Day Smoker  . Smokeless tobacco: Never Used  . Alcohol use Yes     Comment: occasional  . Drug use:     Types: Marijuana  . Sexual activity: No   Other Topics Concern  . None   Social History Narrative  . None   Additional Social History:    Allergies:   Allergies  Allergen Reactions  . Ancef [Cefazolin] Hives  . Ceclor [Cefaclor] Hives  . Demerol [Meperidine] Other (See Comments)    insomnia  . Keflex [Cephalexin] Rash    Labs:  Results for orders placed or performed during the hospital encounter of 01/19/16 (from the past 48 hour(s))  CBC     Status: Abnormal   Collection Time: 01/19/16  2:22 PM  Result Value Ref Range   WBC 15.7 (H) 3.6 - 11.0 K/uL   RBC 4.42 3.80 - 5.20 MIL/uL   Hemoglobin 15.8 12.0 - 16.0 g/dL   HCT 45.5 35.0 - 47.0 %   MCV 102.9 (H) 80.0 - 100.0 fL   MCH 35.6 (H) 26.0 - 34.0 pg   MCHC 34.6 32.0 - 36.0 g/dL   RDW 14.8 (H) 11.5 - 14.5 %   Platelets 346 150 - 440 K/uL  Comprehensive metabolic panel     Status: Abnormal   Collection Time: 01/19/16  2:22 PM  Result Value Ref Range   Sodium 138 135 - 145 mmol/L   Potassium 4.7 3.5 - 5.1 mmol/L   Chloride 100 (L) 101 - 111 mmol/L   CO2 27 22 - 32 mmol/L   Glucose, Bld 137 (H) 65 - 99 mg/dL   BUN <5 (L) 6 - 20 mg/dL   Creatinine, Ser 1.12 (H) 0.44 - 1.00 mg/dL   Calcium 9.3 8.9 - 10.3 mg/dL   Total Protein 7.4 6.5 - 8.1 g/dL   Albumin 4.2 3.5 - 5.0 g/dL   AST 37 15 - 41 U/L   ALT 25 14 - 54 U/L   Alkaline Phosphatase 77 38  - 126 U/L   Total Bilirubin 0.7 0.3 - 1.2 mg/dL   GFR calc non Af Amer 56 (L) >60 mL/min   GFR calc Af Amer >60 >60 mL/min    Comment: (NOTE) The eGFR has been calculated using the CKD EPI equation. This calculation has not been validated in all clinical situations. eGFR's persistently <60 mL/min signify possible Chronic Kidney Disease.    Anion gap 11 5 - 15  Ethanol     Status: None   Collection Time: 01/19/16  2:22 PM  Result Value Ref Range   Alcohol, Ethyl (B) <5 <5 mg/dL    Comment:        LOWEST DETECTABLE LIMIT FOR SERUM ALCOHOL IS 5 mg/dL FOR MEDICAL PURPOSES ONLY   Urine Drug Screen, Qualitative ([facility] only)     Status: Abnormal   Collection Time: 01/19/16  2:22 PM  Result Value Ref Range   Tricyclic, Ur Screen NONE DETECTED NONE DETECTED   Amphetamines, Ur Screen NONE DETECTED NONE DETECTED   MDMA (Ecstasy)Ur Screen NONE DETECTED NONE DETECTED   Cocaine Metabolite,Ur [place] NONE DETECTED NONE DETECTED   Opiate, Ur Screen NONE DETECTED NONE DETECTED   Phencyclidine (PCP) Ur S NONE DETECTED NONE DETECTED   Cannabinoid 50 Ng, Ur [place] POSITIVE (A) NONE DETECTED   Barbiturates, Ur Screen NONE DETECTED NONE DETECTED   Benzodiazepine, Ur Scrn NONE DETECTED NONE DETECTED   Methadone Scn, Ur NONE DETECTED NONE DETECTED    Comment: (NOTE) 242  Tricyclics, urine               Cutoff 1000 ng/mL 200  Amphetamines, urine             Cutoff 1000 ng/mL 300  MDMA (Ecstasy), urine           Cutoff 500 ng/mL 400  Cocaine Metabolite, urine       Cutoff 300 ng/mL 500  Opiate, urine                   Cutoff 300 ng/mL 600  Phencyclidine (PCP), urine      Cutoff 25 ng/mL 700  Cannabinoid, urine              Cutoff 50 ng/mL 800  Barbiturates, urine             Cutoff 200 ng/mL 900  Benzodiazepine, urine           Cutoff 200 ng/mL 1000 Methadone, urine                Cutoff 300 ng/mL 1100 1200 The urine drug screen provides only a preliminary, unconfirmed 1300 analytical test result and  should not be used for non-medical 1400 purposes. Clinical consideration and professional judgment should 1500 be applied to any positive drug screen result due to possible 1600 interfering substances. A more specific alternate chemical method 1700 must be used in order to obtain a confirmed analytical result.  1800 Gas chromato graphy / mass spectrometry (GC/MS) is the preferred 1900 confirmatory method.   Urinalysis complete, with microscopic ([facility] only)     Status: Abnormal   Collection Time: 01/19/16  2:22 PM  Result Value Ref Range   Color, Urine YELLOW (A) YELLOW   APPearance HAZY (A) CLEAR   Glucose, UA NEGATIVE NEGATIVE mg/dL   Bilirubin Urine NEGATIVE NEGATIVE   Ketones, ur NEGATIVE NEGATIVE mg/dL   Specific Gravity, Urine 1.009 1.005 - 1.030   Hgb urine dipstick 1+ (A) NEGATIVE   pH 6.0 5.0 - 8.0   Protein, ur NEGATIVE NEGATIVE mg/dL   Nitrite POSITIVE (A) NEGATIVE   Leukocytes, UA NEGATIVE NEGATIVE   RBC / HPF 0-5 0 - 5 RBC/hpf   WBC, UA 0-5 0 - 5 WBC/hpf   Bacteria, UA RARE (A) NONE SEEN   Squamous Epithelial / LPF 0-5 (A) NONE SEEN   Mucous PRESENT   Salicylate level     Status: None   Collection  Time: 01/19/16  2:22 PM  Result Value Ref Range   Salicylate Lvl <3.7 2.8 - 30.0 mg/dL  Acetaminophen level     Status: Abnormal   Collection Time: 01/19/16  2:22 PM  Result Value Ref Range   Acetaminophen (Tylenol), Serum <10 (L) 10 - 30 ug/mL    Comment:        THERAPEUTIC CONCENTRATIONS VARY SIGNIFICANTLY. A RANGE OF 10-30 ug/mL MAY BE AN EFFECTIVE CONCENTRATION FOR MANY PATIENTS. HOWEVER, SOME ARE BEST TREATED AT CONCENTRATIONS OUTSIDE THIS RANGE. ACETAMINOPHEN CONCENTRATIONS >150 ug/mL AT 4 HOURS AFTER INGESTION AND >50 ug/mL AT 12 HOURS AFTER INGESTION ARE OFTEN ASSOCIATED WITH TOXIC REACTIONS.     No current facility-administered medications for this encounter.    No current outpatient prescriptions on file.    Musculoskeletal: Strength & Muscle  Tone: within normal limits Gait & Station: normal Patient leans: N/A  Psychiatric Specialty Exam: Physical Exam  Nursing note and vitals reviewed. Constitutional: She appears well-developed and well-nourished.  HENT:  Head: Normocephalic and atraumatic.  Eyes: Conjunctivae are normal. Pupils are equal, round, and reactive to [place].  Neck: Normal range of motion.  Cardiovascular: Regular rhythm and normal heart sounds.   Respiratory: She is in respiratory distress.  GI: Soft.  Musculoskeletal: Normal range of motion.  Neurological: She is alert.  Skin: Skin is warm and dry.  Psychiatric: Her affect is blunt. Her speech is delayed and tangential. She is slowed, withdrawn and actively hallucinating. She expresses inappropriate judgment. She expresses no suicidal ideation. She is noncommunicative. She exhibits abnormal recent memory and abnormal remote memory.    Review of Systems  Constitutional: Negative.   HENT: Negative.   Eyes: Negative.   Respiratory: Negative.   Cardiovascular: Negative.   Gastrointestinal: Negative.   Musculoskeletal: Negative.   Skin: Negative.   Neurological: Negative.   Psychiatric/Behavioral: Positive for depression, hallucinations, memory loss and substance abuse. Negative for suicidal ideas. The patient is nervous/anxious and has insomnia.     Blood pressure (!) 168/85, pulse (!) 110, temperature 98.4 F (36.9 C), temperature source Oral, resp. rate 18, height 5' 2"  (1.575 m), weight 83 kg (183 lb), SpO2 96 %.Body mass index is 33.47 kg/m.  General Appearance: Casual  Eye Contact:  Fair  Speech:  Garbled and Slow  Volume:  Decreased  Mood:  Anxious and Dysphoric  Affect:  Blunt and Constricted  Thought Process:  Disorganized and Irrelevant  Orientation:  Other:  Partial. She knows she is in [place] and in a hospital but that's as far as it got.  Thought Content:  Tangential and Patient has really remarkable thought blocking and her speech  when she does talk is so tangential that it's hard to get much detail.  Suicidal Thoughts:  No  Homicidal Thoughts:  No  Memory:  Immediate;   Fair Recent;   Poor Remote;   Poor  Judgement:  Impaired  Insight:  Shallow  Psychomotor Activity:  Decreased  Concentration:  Concentration: Poor  Recall:  Poor  Fund of Knowledge:  Fair  Language:  Fair  Akathisia:  No  Handed:  Right  AIMS (if indicated):     Assets:  Desire for Improvement Physical Health Resilience  ADL's:  Intact  Cognition:  Impaired,  Mild  Sleep:        Treatment Plan Summary: Daily contact with patient to assess and evaluate symptoms and progress in treatment, Medication management and Plan 50 year old woman who presents to the hospital appearing to have some  kind of psychotic condition. We don't have the drug screen back yet but there is no alcohol in her system. She has a few lab abnormalities that could be consistent with chronic alcohol use but nothing profound. Patient is clearly very disorganized in her thinking and unable to take care of herself. Differential diagnosis includes bipolar disorder, schizophrenia, substance-induced psychosis, acute stress reaction. Patient sounds like she identifies with bipolar disorder. She will be admitted to the hospital. Continue IVC. Start treatment with antipsychotic medication. Full set of labs will be obtained. 15 minute checks. Patient informed of the plan and is agreeable.  Disposition: Recommend psychiatric Inpatient admission when medically cleared. Supportive therapy provided about ongoing stressors.  Alethia Berthold, MD 01/19/2016 3:44 PM

## 2016-01-19 NOTE — Progress Notes (Signed)
Pt [facility] in scrubs. Appears disheveled, body odor present. Pt appears to be responding to internal stimuli, but denies AVH. Pt appears fearful, somewhat guarded, confused. Slow to respond, but is able to answer questions regarding past if given time. Cooperative with admission. Skin and contraband search completed with another nurse present. No contraband found. No skin issues noted. Pt reports she is here from [place] and came here initially to live with her daughter, but "things were not the way they seemed once I got here." Reports she has been staying in hotel for the past few days. Says she lived with her mom/dad in [place], and they have a lot on them right now, so she came here to [place]. 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 Reports that she drinks alcohol 3-4 nights a week, 6 pack of beer or less. Says she smokes 1-2 packs of cigarettes a day "depending on partying." States history of "detoxing when I had my nervous breakdown" but was unable to tell me the date of this occurring. States that we can contact her parents Location manager(elmer and Consuella Loselaine fuller) at [phone]. Pt states "I just got approved for disability and I have gotten one check since I got here." Plans to go back to [place] on discharge where she will live with her parents.   Oriented to room/unit. Support and encouragement provided with use of therapeutic communication. Food and fluids ordered. Will continue to monitor.

## 2016-01-19 NOTE — BH Assessment (Signed)
[facility] by Dr. Toni Amendlapacs.  Attending Physician will be Dr. Ardyth HarpsHernandez.   Patient has [facility] to room 309, [facility] Nurse Edwena BundeJanet J.   Intake Paper Work has been signed and placed on patient chart.  ER staff is aware of the admission Elder Negus(Emile, ER Sect.; Dr. Derrill KayGoodman, ER MD; Dora SimsSonjia, Patient's Nurse & Byrd HesselbachMaria, Patient Access).

## 2016-01-19 NOTE — BH Assessment (Signed)
Writer made several attempts to contact patient's love ones.   Writer went through patient's belongings, with her ER Nurse Dora Sims(Sonjia W.) present, to obtain contact information. Was unable to locate her ID and her cell phone was locked and was unable to obtain numbers from it. Patient insurance card was in her wallet and Clinical research associatewriter provided it to Patient Access (Cassime) to update her insurance information. Patient Access returned the card and It was placed back with her belongings.  Writer was able to use the information from one of her prescription bottles, to contact a love one.   Writer called patient's pharmacy, [facility] [phone]) and they provided Clinical research associatewriter with a phone number for patient's mother Consuella Lose(Elaine [phone]) an a number for one of her prescribing physicians (Dr. Viviann SpareSteven Hardwood-[phone]). They stated they were unable to forwarded a copy of her current medication list, without patient's consent.   Writer called and left a HIPPA Compliant message with patient's mother Consuella Lose(Elaine Fuller-[phone]), requesting a return phone call.   Writer contacted the office of one the prescribing physician, [facility] (Brenda-[phone]) and informed them she was in the ER and was trying to contact a love one. The office stated they would have to verify whom writer was and was going to call back.    Writer received phone call from the prescribing physician's office ([phone]) and they provided the writer with the numbers for her mother Consuella Lose(Elaine Fuller-[phone]), her daughter (26Nicole-[phone]) and Son Jeannett Senior(Stephen Alia-[phone]). They stated the number for the patient and mother was the same and was unsure if it was cell phone number or the home's land line.   The number for patient's daughter was out of service. The number to the patient's son would ring and wasn't given the option to leave a voice message. Writer called the  mother's number and was still unable to reach anyone.   Writer received return phone call from the patient's mother and was able to gather collateral information for the TTS assessment. Information is in the TTS Assessment Note.   After talking with the mother, Clinical research associatewriter talked with the patient and received verbal consent for the mother to know she [facility] and provide her with the "code." [facility] Nurse Angelica Chessman(Mandy) was present and witnessed the patient give consent.
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
## 2016-01-19 NOTE — Tx Team (Signed)
Initial Treatment Plan 01/19/2016 7:05 PM Catherine Sergehristine Mago BJY:[id number]    PATIENT STRESSORS: Financial difficulties Marital or family conflict Medication change or noncompliance Substance abuse   PATIENT STRENGTHS: Communication skills Supportive family/friends   PATIENT IDENTIFIED PROBLEMS:   "i'm here from [place] to live with my daughter, but things with her fell through and I've been staying alone in a hotel."    Pt responding to internal stimuli-Denies AVH.    Confusion    Alcohol/Marijauna use "depending on partying"       DISCHARGE CRITERIA:  Ability to meet basic life and health needs Adequate post-discharge living arrangements Improved stabilization in mood, thinking, and/or behavior Verbal commitment to aftercare and medication compliance Withdrawal symptoms are absent or subacute and managed without 24-hour nursing intervention  PRELIMINARY DISCHARGE PLAN: Attend aftercare/continuing care group Outpatient therapy Placement in alternative living arrangements  PATIENT/FAMILY INVOLVEMENT: This treatment plan has been presented to and reviewed with the patient, Catherine Lester, and/or family member, .  The patient and family have been given the opportunity to ask questions and make suggestions.  Tonye PearsonAmanda N Azelia Reiger, RN 01/19/2016, 7:05 PM

## 2016-01-20 ENCOUNTER — Encounter: Payer: Self-pay | Admitting: Psychiatry

## 2016-01-20 ENCOUNTER — Inpatient Hospital Stay: Payer: No Typology Code available for payment source

## 2016-01-20 DIAGNOSIS — I10 Essential (primary) hypertension: Secondary | ICD-10-CM

## 2016-01-20 DIAGNOSIS — F10939 Alcohol use, unspecified with withdrawal, unspecified: Secondary | ICD-10-CM

## 2016-01-20 DIAGNOSIS — F1210 Cannabis abuse, uncomplicated: Secondary | ICD-10-CM

## 2016-01-20 DIAGNOSIS — F17200 Nicotine dependence, unspecified, uncomplicated: Secondary | ICD-10-CM

## 2016-01-20 DIAGNOSIS — F1120 Opioid dependence, uncomplicated: Secondary | ICD-10-CM

## 2016-01-20 DIAGNOSIS — F10239 Alcohol dependence with withdrawal, unspecified: Secondary | ICD-10-CM

## 2016-01-20 DIAGNOSIS — E039 Hypothyroidism, unspecified: Secondary | ICD-10-CM

## 2016-01-20 DIAGNOSIS — F1020 Alcohol dependence, uncomplicated: Secondary | ICD-10-CM

## 2016-01-20 LAB — TSH: TSH: 5.361 u[IU]/mL — AB (ref 0.350–4.500)

## 2016-01-20 LAB — LIPID PANEL
CHOL/HDL RATIO: 4.3 ratio
CHOLESTEROL: 139 mg/dL (ref 0–200)
HDL: 32 mg/dL — ABNORMAL LOW (ref >40)
LDL Cholesterol: 39 mg/dL (ref 0–99)
Triglycerides: 341 mg/dL — ABNORMAL HIGH (ref <150)
VLDL: 68 mg/dL — AB (ref 0–40)

## 2016-01-20 LAB — AMMONIA: Ammonia: 17 umol/L (ref 9–35)

## 2016-01-20 LAB — VITAMIN B12: Vitamin B-12: 157 pg/mL — ABNORMAL LOW (ref 180–914)

## 2016-01-20 LAB — RAPID HIV SCREEN (HIV 1/2 AB+AG)
HIV 1/2 Antibodies: NONREACTIVE
HIV-1 P24 Antigen - HIV24: NONREACTIVE

## 2016-01-20 MED ORDER — LISINOPRIL 10 MG PO TABS
20.0000 mg | ORAL_TABLET | Freq: Every day | ORAL | Status: DC
  Administered 2016-01-20 – 2016-02-07 (×18): 20 mg via ORAL
  Filled 2016-01-20 (×19): qty 2

## 2016-01-20 MED ORDER — LEVOTHYROXINE SODIUM 75 MCG PO TABS
125.0000 ug | ORAL_TABLET | Freq: Every day | ORAL | Status: DC
  Administered 2016-01-21 – 2016-02-04 (×16): 125 ug via ORAL
  Administered 2016-02-05: 25 ug via ORAL
  Administered 2016-02-05: 75 ug via ORAL
  Administered 2016-02-06 – 2016-02-07 (×2): 125 ug via ORAL
  Filled 2016-01-20 (×21): qty 2

## 2016-01-20 MED ORDER — NICOTINE 21 MG/24HR TD PT24
21.0000 mg | MEDICATED_PATCH | Freq: Every day | TRANSDERMAL | Status: DC
  Administered 2016-01-21 – 2016-02-07 (×17): 21 mg via TRANSDERMAL
  Filled 2016-01-20 (×19): qty 1

## 2016-01-20 MED ORDER — CHLORDIAZEPOXIDE HCL 25 MG PO CAPS
50.0000 mg | ORAL_CAPSULE | Freq: Three times a day (TID) | ORAL | Status: DC
  Administered 2016-01-20 – 2016-01-21 (×5): 50 mg via ORAL
  Filled 2016-01-20 (×5): qty 2

## 2016-01-20 NOTE — BHH Group Notes (Signed)
BHH Group Notes:  (Nursing/MHT/Case Management/Adjunct)  Date:  01/20/2016  Time:  12:42 AM  Type of Therapy:  Psychoeducational Skills  Participation Level:  Did Not Attend  Participation QualitySummary of Progress/Problems:  Catherine NeerJackie L Sieara Lester 01/20/2016, 12:42 AM

## 2016-01-20 NOTE — Progress Notes (Signed)
Recreation Therapy Notes  Date: 11.02.17 Time: 9:30 am Location: Craft Room  Group Topic: Leisure Education  Goal Area(s) Addresses:  Patient will write at least one item on the bucket list. Patient will verbalize benefit of using leisure as a coping skill.  Behavioral Response: Did not attend  Intervention: Leisure Bucket List  Activity: Patients were given a Leisure Bucket List worksheet and instructed to write activities they would like to try. Patients were also instructed to write a leisure goal for themselves.  Education: LRT educated patients on ways they can put leisure into their schedules.  Education Outcome: Patient did not attend group.  Clinical Observations/Feedback: Patient did not attend group.  Haily Caley M, LRT/CTRS 01/20/2016 10:18 AM 

## 2016-01-20 NOTE — Plan of Care (Signed)
Problem: Education: Goal: Mental status will improve Outcome: Not Progressing Pt appears confused and requires frequent reassurance from staff. Pt affect is anxious.  Problem: Health Behavior/Discharge Planning: Goal: Compliance with prescribed medication regimen will improve Outcome: Progressing Pt takes medications as prescribed and asks questions appropriately.

## 2016-01-20 NOTE — BHH Counselor (Signed)
Pt is not agreeable to participate in psychosocial assessment at this time. Pt is irritable and demanding discharge. Pt stated that she does not want to speak to anyone but her doctor regarding discharge.   Hampton AbbotKadijah Demecia Northway, MSW, LCSW-A 01/20/2016, 11:28AM
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
## 2016-01-20 NOTE — BHH Group Notes (Signed)
BHH LCSW Group Therapy Note  Type of Therapy and Topic:  Group Therapy:  Goals Group: SMART Goals  Participation Level:  Patient did not attend group. CSW invited patient to group.   Description of Group:   The purpose of a daily goals group is to assist and guide patients in setting recovery/wellness-related goals.  The objective is to set goals as they relate to the crisis in which they were admitted. Patients will be using SMART goal modalities to set measurable goals.  Characteristics of realistic goals will be discussed and patients will be assisted in setting and processing how one will reach their goal. Facilitator will also assist patients in applying interventions and coping skills learned in psycho-education groups to the SMART goal and process how one will achieve defined goal.  Therapeutic Goals: -Patients will develop and document one goal related to or their crisis in which brought them into treatment. -Patients will be guided by LCSW using SMART goal setting modality in how to set a measurable, attainable, realistic and time sensitive goal.  -Patients will process barriers in reaching goal. -Patients will process interventions in how to overcome and successful in reaching goal.   Summary of Patient Progress:  Patient Goal: Patient did not attend group.    Therapeutic Modalities:   Motivational Interviewing Engineer, manufacturing systemsCognitive Behavioral Therapy Crisis Intervention Model SMART goals setting  Taleen Prosser G. Garnette CzechSampson MSW, [facility] 01/20/2016 10:37 AM

## 2016-01-20 NOTE — BHH Suicide Risk Assessment (Signed)
[facility]   Nursing information obtained from:    Demographic factors:    Current Mental Status:    Loss Factors:    Historical Factors:    Risk Reduction Factors:     Total Time spent with patient: 1 hour Principal Problem: Bipolar 1 disorder, mixed, severe (HCC) Diagnosis:   Patient Active Problem List   Diagnosis Date Noted  . Alcohol use disorder, severe, dependence (HCC) [F10.20] 01/20/2016  . Alcohol withdrawal (HCC) [F10.239] 01/20/2016  . Cannabis use disorder, mild, abuse [F12.10] 01/20/2016  . Tobacco use disorder [F17.200] 01/20/2016  . HTN (hypertension) [I10] 01/20/2016  . Hypothyroidism [E03.9] 01/20/2016  . Opioid use disorder, moderate, dependence (HCC) [F11.20] 01/20/2016  . Bipolar 1 disorder, mixed, severe (HCC) [F31.63] 01/19/2016   Subjective Data:   Continued Clinical Symptoms:  Alcohol Use Disorder Identification Test Final Score (AUDIT): 16 The "Alcohol Use Disorders Identification Test", Guidelines for Use in Primary Care, Second Edition.  World Science writerHealth Organization [facility](WHO). Score between 0-7:  no or low risk or alcohol related problems. Score between 8-15:  moderate risk of alcohol related problems. Score between 16-19:  high risk of alcohol related problems. Score 20 or above:  warrants further diagnostic evaluation for alcohol dependence and treatment.   CLINICAL FACTORS:   Severe Anxiety and/or Agitation Alcohol/Substance Abuse/Dependencies Currently Psychotic    Psychiatric Specialty Exam: Physical Exam  ROS  Blood pressure (!) 154/86, pulse (!) 106, temperature 98.4 F (36.9 C), resp. rate 18, height 5\' 2"  (1.575 m), weight 83 kg (182 lb 15.7 oz), SpO2 98 %.Body mass index is 33.47 kg/m.                                                    Sleep:  Number of Hours: 6      COGNITIVE FEATURES THAT CONTRIBUTE TO RISK:  Loss of executive function    SUICIDE RISK:   Moderate:  Frequent  suicidal ideation with limited intensity, and duration, some specificity in terms of plans, no associated intent, good self-control, limited dysphoria/symptomatology, some risk factors present, and identifiable protective factors, including available and accessible social support.   [facility]  I certify that inpatient services furnished can reasonably be expected to improve the patient's condition.  Jimmy FootmanHernandez-Gonzalez,  Donatella Walski, MD 01/20/2016, 1:41 PM

## 2016-01-20 NOTE — BHH Group Notes (Signed)
BHH Group Notes:  (Nursing/MHT/Case Management/Adjunct)  Date:  01/20/2016  Time:  10:18 PM  Type of Therapy:  Psychoeducational Skills  Participation Level:  Did Not Attend  Participation Quality:Summary of Progress/Problems:  Mayra NeerJackie L Teala Daffron 01/20/2016, 10:18 PM

## 2016-01-20 NOTE — BHH Group Notes (Signed)
BHH LCSW Group Therapy  01/20/2016 2:45 PM  Type of Therapy:  Group Therapy  Participation Level:  Patient did not attend group. CSW invited patient to group.   Summary of Progress/Problems:Emotional Regulation: Patients will identify both negative and positive emotions. They will discuss emotions they have difficulty regulating and how they impact their lives. Patients will be asked to identify healthy coping skills to combat unhealthy reactions to negative emotions.    Catherine Lester MSW, LCSWA 01/20/2016, 2:52 PM

## 2016-01-20 NOTE — Progress Notes (Signed)
D: Pt affect is very anxious this evening. She appears fearful and confused and requires frequent redirection and reassurance from staff. Pt reports chronic pain and requests PRN medication with minimal improvement. Pt states anxiety is "really high" and rates depression 0/10. Denies SI/HI/AVH at this time, although she is heard talking to herself. Pt WBC elevated. MD on call notified and antibiotic ordered and administered. Pt is disorganized and hard to follow at times. A: Emotional support and encouragement provided. Pt reoriented to unit. Medications administered with education. q15 minute safety checks maintained. R: Pt remains free from harm. Will continue to monitor.
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
## 2016-01-20 NOTE — Progress Notes (Signed)
Pt irritable this evening. On approach, she asks "what did you say about my mom?" When questioned further, pt becomes angry, "I'm not psychotic but you guys are going to make me snap! I need to leave. Put me on a cardiac floor or something but I'm not staying here." Pt thought process continues to be disorganized. CIWA scored an 8. PRN ativan administered as prescribed. Pt remains free from harm. Will continue to monitor.

## 2016-01-21 LAB — HEMOGLOBIN A1C
Hgb A1c MFr Bld: 5.4 % (ref 4.8–5.6)
Mean Plasma Glucose: 108 mg/dL

## 2016-01-21 LAB — SYPHILIS: RPR W/REFLEX TO RPR TITER AND TREPONEMAL ANTIBODIES, TRADITIONAL SCREENING AND DIAGNOSIS ALGORITHM: RPR Ser Ql: NONREACTIVE

## 2016-01-21 LAB — URINE CULTURE

## 2016-01-21 LAB — PROLACTIN: Prolactin: 54.9 ng/mL — ABNORMAL HIGH (ref 4.8–23.3)

## 2016-01-21 MED ORDER — CHLORDIAZEPOXIDE HCL 25 MG PO CAPS
25.0000 mg | ORAL_CAPSULE | Freq: Three times a day (TID) | ORAL | Status: AC
  Administered 2016-01-22: 25 mg via ORAL
  Filled 2016-01-21: qty 1

## 2016-01-21 MED ORDER — CYANOCOBALAMIN 1000 MCG/ML IJ SOLN
1000.0000 ug | Freq: Every day | INTRAMUSCULAR | Status: AC
  Administered 2016-01-21 – 2016-01-23 (×2): 1000 ug via INTRAMUSCULAR
  Filled 2016-01-21 (×2): qty 1

## 2016-01-21 MED ORDER — FOSFOMYCIN TROMETHAMINE 3 G PO PACK
3.0000 g | PACK | Freq: Once | ORAL | Status: AC
  Administered 2016-01-21: 3 g via ORAL
  Filled 2016-01-21: qty 3

## 2016-01-21 NOTE — Plan of Care (Signed)
Problem: Coping: Goal: Ability to verbalize frustrations and anger appropriately will improve Outcome: Not Progressing Pt verbally aggressive and threatening towards staff. Pt heard several times throughout the evening yelling at staff.  Problem: Role Relationship: Goal: Ability to interact with others will improve Outcome: Not Progressing Pt does not interact with peers.

## 2016-01-21 NOTE — [facility] Group Notes (Signed)
[facility] LCSW Group Therapy  01/21/2016 1:55 PM  Type of Therapy:  Group Therapy  Participation Level:  Active  Participation Quality:  Appropriate  Affect:  Appropriate and Flat  Cognitive:  Appropriate  Insight:  Limited  Engagement in Therapy:  Improving  Modes of Intervention:  Activity, Discussion, Education and Support  Summary of Progress/Problems:Feelings around Relapse. Group members discussed the meaning of relapse and shared personal stories of relapse, how it affected them and others, and how they perceived themselves during this time. Group members were encouraged to identify triggers, warning signs and coping skills used when facing the possibility of relapse. Social supports were discussed and explored in detail. Patients also discussed facing disappointment and how that can trigger someone to relapse.   Artis Beggs G. Garnette CzechSampson MSW, LCSWA 01/21/2016, 1:55 PM

## 2016-01-21 NOTE — Progress Notes (Signed)
Recreation Therapy Notes  Date: 11.03.17 Time: 9:30 am Location: Craft Room  Group Topic: Self-expression/Coping Skills  Goal Area(s) Addresses:  Patient will effectively use art as a means of self-expression. Patient will recognize positive benefit of self-expression. Patient will be able to identify one emption experienced during group session. Patient will identify use of art as a coping skill.  Behavioral Response: Attentive, Left early  Intervention: Two Faces of Me  Activity: Patients were given a blank face worksheet and were instructed to draw a line down the middle. On one side of the worksheet they were instructed to draw or write how they felt when they were admitted to the hospital. On the other side, they were instructed to draw or write how they want to feel when they are d/c.  Education: LRT educated patients on healthy coping skills.  Education Outcome: Patient left before LRT educated group.  Clinical Observations/Feedback: Patient completed activity by writing and drawing how she felt when she was admitted and how she wants to feel when she is d/c. Patient left group at approximately 9:56 am and did not return to group.  Jacquelynn CreeGreene,Charisma Charlot M, LRT/CTRS 01/21/2016 10:18 AM

## 2016-01-21 NOTE — Plan of Care (Signed)
Problem: Activity: Goal: Interest or engagement in activities will improve Outcome: Progressing Pt awake, alert, up on unit today interacting appropriately with peers. No aggressive behaviors today. Calm and cooperative. Attends groups.

## 2016-01-21 NOTE — Progress Notes (Signed)
Pt awake, alert, up on unit today. Cooperative. Appropriately interacts with staff/peers. Denies SI/HI/AVH. Pt tearful this morning stating "I feel like a roller coaster, up and down." Much less depressed this afternoon, smiling/brightening on approach. No reports of aggressive behaviors today. Attends groups and participates. Earlier today, pt approached me stating "my [place]  [place] and take me back to [place]." Pt reoriented to the fact that she is involuntarily committed and that discharge is up to the doctor. No discharge orders today. Pt easily redirected. Medication compliant.   Support and encouragement provided with use of therapeutic communication. Medication administered as ordered with education. Safety maintained with every 15 minute checks. Will continue to monitor.
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
## 2016-01-21 NOTE — [facility] Suicide Risk Assessment (Signed)
[facility] INPATIENT:  Family/Significant Other Suicide Prevention Education  Suicide Prevention Education:  Education Completed; daughter, Catherine Lester ph#: [phone] has been identified by the patient as the family member/significant other with whom the patient will be residing, and identified as the person(s) who will aid the patient in the event of a mental health crisis (suicidal ideations/suicide attempt).  With written consent from the patient, the family member/significant other has been provided the following suicide prevention education, prior to the and/or following the discharge of the patient. Pt's daughter stated that pt has been hallucinating which is abnormal for patient. She stated that pt thinks that daughter has been setting her up and have had people "out to get her." Pt's daughter stated she will pick pt up at discharge when psychiatrically clear.  The suicide prevention education provided includes the following:  Suicide risk factors  Suicide prevention and interventions  National Suicide Hotline telephone number  [facility] assessment telephone number  [facility] Emergency Assistance 911  [facility] and/or Residential Mobile Crisis Unit telephone number  Request made of family/significant other to:  Remove weapons (e.g., guns, rifles, knives), all items previously/currently identified as safety concern.    Remove drugs/medications (over-the-counter, prescriptions, illicit drugs), all items previously/currently identified as a safety concern.  The family member/significant other verbalizes understanding of the suicide prevention education information provided.  The family member/significant other agrees to remove the items of safety concern listed above.  Catherine Lester, MSW, LCSW-A 01/21/2016, 3:20 PM
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
## 2016-01-21 NOTE — Progress Notes (Signed)
D: Pt affect is angry this evening. She is verbally aggressive and threatening towards staff, stating "tell them I'm not pleased with how you all conduct yourself. You're mistreating me. You can't make me stay here." Pt is focused on smoking a cigarette and becomes irritable when request is denied. Pt continues to be disorganized and accuses staff of "working with" her daughter. She c/o generalized pain and requests PRN medication. Denies SI/HI/AVH at this time.  A: Emotional support and encouragement provided. Medications administered with education. q15 minute safety checks maintained. R: Pt remains free from harm. Will continue to monitor.

## 2016-01-21 NOTE — Tx Team (Signed)
Interdisciplinary Treatment and Diagnostic Plan Update  01/21/2016 Time of Session: 10:30AM Catherine Lester MRN: [id number]  Principal Diagnosis: Bipolar 1 disorder, mixed, severe (HCC)  Secondary Diagnoses: Principal Problem:   Bipolar 1 disorder, mixed, severe (HCC) Active Problems:   Alcohol use disorder, severe, dependence (HCC)   Alcohol withdrawal (HCC)   Cannabis use disorder, mild, abuse   Tobacco use disorder   HTN (hypertension)   Hypothyroidism   Opioid use disorder, moderate, dependence (HCC)   Current Medications:  Current Facility-Administered Medications  Medication Dose Route Frequency Provider Last Rate Last Dose  . acetaminophen (TYLENOL) tablet 650 mg  650 mg Oral Q6H PRN Audery Amel, MD      . alum & mag hydroxide-simeth (MAALOX/MYLANTA) 200-200-20 MG/5ML suspension 30 mL  30 mL Oral Q4H PRN Audery Amel, MD      . aspirin EC tablet 81 mg  81 mg Oral Daily Audery Amel, MD   81 mg at 01/21/16 0832  . chlordiazePOXIDE (LIBRIUM) capsule 50 mg  50 mg Oral TID Jimmy Footman, MD   50 mg at 01/21/16 1884  . ibuprofen (ADVIL,MOTRIN) tablet 400 mg  400 mg Oral Q6H PRN Audery Amel, MD   400 mg at 01/21/16 0938  . levothyroxine (SYNTHROID, LEVOTHROID) tablet 125 mcg  125 mcg Oral QAC breakfast Jimmy Footman, MD   125 mcg at 01/21/16 0656  . lisinopril (PRINIVIL,ZESTRIL) tablet 20 mg  20 mg Oral Daily Jimmy Footman, MD   20 mg at 01/21/16 0831  . magnesium hydroxide (MILK OF MAGNESIA) suspension 30 mL  30 mL Oral Daily PRN Audery Amel, MD      . nicotine (NICODERM CQ - dosed in mg/24 hours) patch 21 mg  21 mg Transdermal Daily Jimmy Footman, MD   21 mg at 01/21/16 0831  . paliperidone (INVEGA) 24 hr tablet 6 mg  6 mg Oral QHS Audery Amel, MD   6 mg at 01/20/16 2148  . simvastatin (ZOCOR) tablet 20 mg  20 mg Oral q1800 Audery Amel, MD   20 mg at 01/20/16 1619  . traZODone (DESYREL) tablet 100 mg  100 mg Oral  QHS PRN Audery Amel, MD   100 mg at 01/20/16 2148   PTA Medications: Prescriptions Prior to Admission  Medication Sig Dispense Refill Last Dose  . amoxicillin (AMOXIL) 875 MG tablet Take 875 mg by mouth 2 (two) times daily.     Marland Kitchen aspirin 81 MG chewable tablet Chew by mouth daily.     . fluticasone (FLONASE) 50 MCG/ACT nasal spray Place into both nostrils daily.     Marland Kitchen gabapentin (NEURONTIN) 300 MG capsule Take 300 mg by mouth daily.     Marland Kitchen ibuprofen (ADVIL,MOTRIN) 600 MG tablet Take 600 mg by mouth.     . levothyroxine (SYNTHROID, LEVOTHROID) 125 MCG tablet Take 125 mcg by mouth daily before breakfast.     . lisinopril (PRINIVIL,ZESTRIL) 20 MG tablet Take 20 mg by mouth daily.     . Multiple Vitamins-Minerals (MULTIVITAMIN WITH MINERALS) tablet Take 1 tablet by mouth daily.     Marland Kitchen NIFEdipine (PROCARDIA-XL/ADALAT CC) 30 MG 24 hr tablet Take 30 mg by mouth daily.     Marland Kitchen NIFEdipine (PROCARDIA-XL/ADALAT-CC/NIFEDICAL-XL) 30 MG 24 hr tablet Take 30 mg by mouth daily.     . Probiotic Product (PROBIOTIC ACIDOPHILUS BEADS PO) Take by mouth.     . Pumpkin Seed-Soy Germ (AZO BLADDER CONTROL/GO-LESS PO) Take by mouth.     Marland Kitchen  traMADol (ULTRAM) 50 MG tablet Take by mouth every 6 (six) hours as needed.       Patient Stressors: Financial difficulties Marital or family conflict Medication change or noncompliance Substance abuse  Patient Strengths: Manufacturing systems engineerCommunication skills Supportive family/friends  Treatment Modalities: Medication Management, Group therapy, Case management,  1 to 1 session with clinician, Psychoeducation, Recreational therapy.   Physician Treatment Plan for Primary Diagnosis: Bipolar 1 disorder, mixed, severe (HCC) Long Term Goal(s): Improvement in symptoms so as ready for discharge Improvement in symptoms so as ready for discharge   Short Term Goals: Ability to identify changes in lifestyle to reduce recurrence of condition will improve Ability to verbalize feelings will  improve Ability to demonstrate self-control will improve Ability to identify and develop effective coping behaviors will improve Compliance with prescribed medications will improve Ability to identify triggers associated with substance abuse/mental health issues will improve Ability to identify changes in lifestyle to reduce recurrence of condition will improve Ability to verbalize feelings will improve Ability to demonstrate self-control will improve Ability to identify and develop effective coping behaviors will improve Ability to identify triggers associated with substance abuse/mental health issues will improve  Medication Management: Evaluate patient's response, side effects, and tolerance of medication regimen.  Therapeutic Interventions: 1 to 1 sessions, Unit Group sessions and Medication administration.  Evaluation of Outcomes: Progressing  Physician Treatment Plan for Secondary Diagnosis: Principal Problem:   Bipolar 1 disorder, mixed, severe (HCC) Active Problems:   Alcohol use disorder, severe, dependence (HCC)   Alcohol withdrawal (HCC)   Cannabis use disorder, mild, abuse   Tobacco use disorder   HTN (hypertension)   Hypothyroidism   Opioid use disorder, moderate, dependence (HCC)  Long Term Goal(s): Improvement in symptoms so as ready for discharge Improvement in symptoms so as ready for discharge   Short Term Goals: Ability to identify changes in lifestyle to reduce recurrence of condition will improve Ability to verbalize feelings will improve Ability to demonstrate self-control will improve Ability to identify and develop effective coping behaviors will improve Compliance with prescribed medications will improve Ability to identify triggers associated with substance abuse/mental health issues will improve Ability to identify changes in lifestyle to reduce recurrence of condition will improve Ability to verbalize feelings will improve Ability to demonstrate  self-control will improve Ability to identify and develop effective coping behaviors will improve Ability to identify triggers associated with substance abuse/mental health issues will improve     Medication Management: Evaluate patient's response, side effects, and tolerance of medication regimen.  Therapeutic Interventions: 1 to 1 sessions, Unit Group sessions and Medication administration.  Evaluation of Outcomes: Progressing   RN Treatment Plan for Primary Diagnosis: Bipolar 1 disorder, mixed, severe (HCC) Long Term Goal(s): Knowledge of disease and therapeutic regimen to maintain health will improve  Short Term Goals: Ability to remain free from injury will improve, Ability to verbalize frustration and anger appropriately will improve, Ability to demonstrate self-control and Ability to identify and develop effective coping behaviors will improve  Medication Management: RN will administer medications as ordered by provider, will assess and evaluate patient's response and provide education to patient for prescribed medication. RN will report any adverse and/or side effects to prescribing provider.  Therapeutic Interventions: 1 on 1 counseling sessions, Psychoeducation, Medication administration, Evaluate responses to treatment, Monitor vital signs and CBGs as ordered, Perform/monitor CIWA, COWS, AIMS and Fall Risk screenings as ordered, Perform wound care treatments as ordered.  Evaluation of Outcomes: Progressing   LCSW Treatment Plan for Primary Diagnosis:  Bipolar 1 disorder, mixed, severe (HCC) Long Term Goal(s): Safe transition to appropriate next level of care at discharge, Engage patient in therapeutic group addressing interpersonal concerns.  Short Term Goals: Engage patient in aftercare planning with referrals and resources, Increase social support, Increase emotional regulation, Identify triggers associated with mental health/substance abuse issues and Increase skills for  wellness and recovery  Therapeutic Interventions: Assess for all discharge needs, 1 to 1 time with Social worker, Explore available resources and support systems, Assess for adequacy in community support network, Educate family and significant other(s) on suicide prevention, Complete Psychosocial Assessment, Interpersonal group therapy.  Evaluation of Outcomes: Progressing   Progress in Treatment: Attending groups: Yes. Participating in groups: Yes. Taking medication as prescribed: Yes. Toleration medication: Yes. Family/Significant other contact made: No, will contact:  daughter and mother Patient understands diagnosis: Yes. Discussing patient identified problems/goals with staff: Yes. Medical problems stabilized or resolved: Yes. Denies suicidal/homicidal ideation: Yes. Issues/concerns per patient self-inventory: No.   New problem(s) identified: No, Describe:  None identified   New Short Term/Long Term Goal(s):  Discharge Plan or Barriers:   Reason for Continuation of Hospitalization: Depression Hallucinations  Estimated Length of Stay: 3-5 days   Attendees: Patient: Catherine Lester  01/21/2016 11:12 AM  Physician: Dr. Kristine LineaJolanta Pucilowska, MD  01/21/2016 11:12 AM  Nursing: Elenore PaddyJennifer Morrow, RN 01/21/2016 11:12 AM  RN Care Manager: 01/21/2016 11:12 AM  Social Worker: Hampton AbbotKadijah Syrus Nakama, MSW, LCSW-A 01/21/2016 11:12 AM  Recreational Therapist: Hershal CoriaBeth Greene, LRT, CTRS 01/21/2016 11:12 AM   Scribe for Treatment Team: Lynden OxfordKadijah R Kinaya Hilliker, LCSWA 01/21/2016 11:12 AM

## 2016-01-22 MED ORDER — LORAZEPAM 2 MG PO TABS
ORAL_TABLET | ORAL | Status: AC
  Filled 2016-01-22: qty 1

## 2016-01-22 MED ORDER — LORAZEPAM 2 MG PO TABS
2.0000 mg | ORAL_TABLET | Freq: Four times a day (QID) | ORAL | Status: DC | PRN
  Administered 2016-01-22 – 2016-01-24 (×5): 2 mg via ORAL
  Filled 2016-01-22 (×4): qty 1

## 2016-01-22 MED ORDER — QUETIAPINE FUMARATE 25 MG PO TABS
25.0000 mg | ORAL_TABLET | Freq: Four times a day (QID) | ORAL | Status: DC | PRN
  Filled 2016-01-22 (×2): qty 1

## 2016-01-22 NOTE — Progress Notes (Signed)
Pt denies SI/HI/AVH. Affect is irritable. Forwards little. Pt came to med room to request medication for back pain. She appeared to become agitated but stated " I will be back" Pt went to her room for several minutes and then returned.  Motrin given for pain. Pt didn't attend evening group. Minimal interaction. Voices no concerns at this time. Safety maintained. Will continue to monitor.

## 2016-01-22 NOTE — Progress Notes (Signed)
Patient ID: Catherine Lester, female   DOB: 1965-11-12, 50 y.o.   MRN: [id number]    D: Pt has been very labile on the unit today. At times patient was very flat and depressed and isolative to her room. Other times patient was very explosive and would lash out on staff at every interaction. Pt felt that staff were holding her against her will and that her family was trying to keep her in the hospital. Pt reported that she wanted to sign out AMA and that nothing was wrong with her. Pt got very upset and started threatening staff and reported that her brother was on his way to pick her up and that staff needed to get her shit together. Pt reported that she was going out of the hospital one way or another. Staff attempted to counsel with patient, patient was not receptive to staff. Dr. Blair Haileylapcs was notified regarding patients behavior and the fact that patient refused to take prn dose of Seroquel. New orders were noted to give patient Ativan 2mg  po, patient took medication without any problems. Pt continued to display the same behavior. Pt reported that her depression was a 2, her hopelessness was a 10 plus, and her anxiety was a 10 plus. Pt reported that she was positive SI, but could contract for safety. Pt reported that her goal for today was to work on her communication skills. Pt reported being negative HI, no AH/VH noted. A: 15 min checks continued for patient safety. R: Pt safety maintained.
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
## 2016-01-22 NOTE — [facility] Group Notes (Signed)
[facility] LCSW Group Therapy  01/22/2016 2:35 PM  Type of Therapy:  Group Therapy  Participation Level:  Patient did not attend group. CSW invited patient to group.   Summary of Progress/Problems: Coping Skills: Patients defined and discussed healthy coping skills. Patients identified healthy coping skills they would like to try during hospitalization and after discharge. CSW offered insight to varying coping skills that may have been new to patients such as practicing mindfulness.  Kirrah Mustin G. Garnette CzechSampson MSW, LCSWA 01/22/2016, 2:36 PM

## 2016-01-23 MED ORDER — LIDOCAINE 5 % EX PTCH
1.0000 | MEDICATED_PATCH | CUTANEOUS | Status: DC
  Administered 2016-01-23 – 2016-01-28 (×5): 1 via TRANSDERMAL
  Filled 2016-01-23 (×16): qty 1

## 2016-01-23 NOTE — [facility] Group Notes (Signed)
[facility] Group Notes:  (Nursing/MHT/Case Management/Adjunct)  Date:  01/23/2016  Time:  4:07 AM  Type of Therapy:  Group Therapy  Participation Level:  Minimal  Participation Quality:  Sharing  Affect:  Depressed and Tearful  Cognitive:  Alert  Insight:  Appropriate  Engagement in Group:  Limited  Modes of Intervention:  Support  Summary of Progress/Problems: Pt stated that her goal was to reach out to her daughter. Pt stated that it did not go well. Pt started to cry and left group early.   [name] 01/23/2016, 4:07 AM

## 2016-01-23 NOTE — [facility] Group Notes (Signed)
[facility] LCSW Group Therapy  01/23/2016 2:42 PM  Type of Therapy:  Group Therapy  Participation Level:  Minimal  Participation Quality:  Appropriate  Affect:  Appropriate  Cognitive:  Alert  Insight:  Engaged  Engagement in Therapy:  Engaged  Modes of Intervention:  Activity, Discussion, Education, Socialization and Support  Summary of Progress/Problems:Communications: Patients identify how individuals communicate with one another appropriately and inappropriately. Patients will be guided to discuss their thoughts, feelings, and behaviors related to barriers when communicating. The group will process together ways to execute positive and appropriate communications. Patient was able to appropriately communicate with her teammates during the group game of pictionary.   Karma Hiney G. Garnette CzechSampson MSW, LCSWA 01/23/2016, 2:43 PM

## 2016-01-23 NOTE — Plan of Care (Signed)
Problem: Health Behavior/Discharge Planning: Goal: Ability to manage health-related needs will improve Outcome: Progressing Patient medication complaint with encouragement.
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
## 2016-01-23 NOTE — [facility] Group Notes (Signed)
[facility] Group Notes:  (Nursing/MHT/Case Management/Adjunct)  Date:  01/23/2016  Time:  10:42 PM  Type of Therapy:  Psychoeducational Skills  Participation Level:  Active  Participation Quality:  Sharing  Affect:  Appropriate  Cognitive:  Alert  Insight:  Improving  Engagement in Group:  Engaged  Modes of Intervention:  Activity  Summary of Progress/Problems:  Mayra NeerJackie L Jasmeet Manton 01/23/2016, 10:42 PM

## 2016-01-23 NOTE — Progress Notes (Signed)
Lisinopil 20mg  PO held this morning at 0836 due to low BP. MD, Dr. Toni Amendlapacs notified.
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
## 2016-01-24 MED ORDER — PALIPERIDONE ER 3 MG PO TB24: 9.0000 mg | ORAL_TABLET | Freq: Every day | ORAL | Status: DC

## 2016-01-24 MED ORDER — PALIPERIDONE ER 3 MG PO TB24
12.0000 mg | ORAL_TABLET | Freq: Every day | ORAL | Status: DC
  Administered 2016-01-24 – 2016-01-25 (×2): 12 mg via ORAL
  Filled 2016-01-24 (×2): qty 4

## 2016-01-24 MED ORDER — CYANOCOBALAMIN 1000 MCG/ML IJ SOLN
1000.0000 ug | Freq: Every day | INTRAMUSCULAR | Status: DC
  Administered 2016-01-25 – 2016-01-26 (×2): 1000 ug via SUBCUTANEOUS
  Filled 2016-01-24 (×2): qty 1

## 2016-01-24 MED ORDER — LORAZEPAM 0.5 MG PO TABS
0.5000 mg | ORAL_TABLET | Freq: Three times a day (TID) | ORAL | Status: DC | PRN
  Administered 2016-01-24 – 2016-02-06 (×25): 0.5 mg via ORAL
  Filled 2016-01-24 (×24): qty 1

## 2016-01-24 MED ORDER — LORAZEPAM 2 MG PO TABS
2.0000 mg | ORAL_TABLET | ORAL | Status: DC | PRN
  Administered 2016-01-26 – 2016-01-31 (×12): 2 mg via ORAL
  Filled 2016-01-24 (×12): qty 1

## 2016-01-24 NOTE — Progress Notes (Signed)
Recreation Therapy Notes  Date: 11.06.17 Time: 9:30 am Location: Craft Room  Group Topic: Self-expression  Goal Area(s) Addresses:  Patient will identify one color per emotion listed on wheel.  Patient will verbalize benefit of using art as a means of self-expression. Patient will verbalize one emotion experienced during session. Patient will be educated on other forms of self-expression.  Behavioral Response: Attentive, Interactive  Intervention: Emotion Wheel  Activity: Patients were given a worksheet with 7 emotions listed and were instructed to pick a color for each emotion.  Education: LRT educated patients on other forms of self-expression.  Education Outcome: Acknowledges education/In group clarification offered  Clinical Observations/Feedback: Patient picked a color for each emotion. Patient contributed to group discussion by stating some of the colors she picked for certain emotions.  Jacquelynn CreeGreene,Katherleen Folkes M, LRT/CTRS 01/24/2016 10:22 AM
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
## 2016-01-24 NOTE — Plan of Care (Signed)
Problem: Coping: Goal: Ability to demonstrate self-control will improve Outcome: Not Progressing Pt very labile, becomes agitated, verbally aggressive easily. Left group this morning and requested PRN ativan as ordered.
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
## 2016-01-24 NOTE — Plan of Care (Signed)
Problem: Education: Goal: Will be free of psychotic symptoms Outcome: Not Progressing Pt remains delusional about daughter. Stated "I know my daughter is in here with me. Y'all are keeping her from me." Goal: Knowledge of the prescribed therapeutic regimen will improve Outcome: Not Progressing Pt is confused and stated "what is Invega? I've never taken that before." Pt has been on Invega for almost a week.  Problem: Coping: Goal: Ability to verbalize feelings will improve Outcome: Progressing Is able to verbalize feelings.  Problem: Health Behavior/Discharge Planning: Goal: Compliance with prescribed medication regimen will improve Outcome: Progressing Will remain medication compliant.  Problem: Safety: Goal: Ability to remain free from injury will improve Outcome: Progressing Pt has remained free from injury.

## 2016-01-24 NOTE — Progress Notes (Signed)
[facility]  01/24/2016 3:03 PM Catherine SergeChristine Lester  MRN:  [id number]  Subjective:  Patient is a 50 year old female who reports to the ER by way of police after becoming agitated and acting bizarre at a local motel. Patient was a very poor historian due to thought blocking and delusion/confusion. Patient states that [facility] [place] to help her daughter. There was a dispute between the patient and her daughter and the daughter kicked her out of the house. Patient states that she went to a local motel where the employees started bullying her and not letting her sleep. She states that her daughter and her boyfriend told the employees to bully her. Patient states that she knows her family is here in the hospital admitted. She can hear them talking outside her door so she knows they are here. She also knows that there is an investigation going on. Patient is very agitated and states that she does not need to be here. She wants to go out and smoke. Patient has other delusions including that her phone and motel room was bugged by the police.  Today the patient was extremely hostile and irritable during interaction. Continues to state that she is in the hospital because she was set up by her daughter and her daughter's boyfriend. Patient states there is absolutely nothing wrong with her. She believes that we have our own agenda for which we are ordering  a lot of psychiatric medications. Staff report the patient show less hostility and irritability yesterday. However once again today she does not appear much improved from her original admission day last week.  Last night she was telling the nurses that she was able to hear her daughter who she thought was here in the unit.  Per nursing: Pt continues to be labile today and pt verbalizes awareness. Became agitated in group, came to this Clinical research associatewriter for PRN ativan. When asked what happened, pt refused to speak about what happened "because you already  know." Stated she felt the MHT in charge of group was being "condescending." Pt continues to be disorganized, confused at times, delusional stating "i'm tired to putting my mom on a roller coaster ride and telling her I'm discharged and then I'm not. Am I being discharged today?" Pt has not been told that she would be discharged any days, but states she believes she has been told she is/was discharged. Reports good sleep, good appetite, normal energy and good concentration. Rates depression 0/10, hopelessness 0/10, anxiety 6/10 (low 0-10 high). Denies SI/HI/AVH. Goal for today is "discharge planning" by "participate in group, meals, activity." Medication complaint.   Support and encouragement provided with use of therapeutic communication. Medications administered as ordered with education to include PRN medications ordered. Safety maintained with every 15 minute checks. Will continue to monitor.   Principal Problem: Bipolar 1 disorder, mixed, severe (HCC) Diagnosis:   Patient Active Problem List   Diagnosis Date Noted  . Alcohol use disorder, severe, dependence (HCC) [F10.20] 01/20/2016  . Alcohol withdrawal (HCC) [F10.239] 01/20/2016  . Cannabis use disorder, mild, abuse [F12.10] 01/20/2016  . Tobacco use disorder [F17.200] 01/20/2016  . HTN (hypertension) [I10] 01/20/2016  . Hypothyroidism [E03.9] 01/20/2016  . Opioid use disorder, moderate, dependence (HCC) [F11.20] 01/20/2016  . Bipolar 1 disorder, mixed, severe (HCC) [F31.63] 01/19/2016   Total Time spent with patient: 30 minutes  Past Psychiatric History: Patient denies past psychiatric history. Patient's mother states that patient has been diagnosed with anxiety. She  denies any previous hospitalizations due to mental health reasons.  Past Medical History:  Past Medical History:  Diagnosis Date  . Anxiety   . Hx of heart valve insufficiency   . Hypertension   . Thyroid disease   . Urinary incontinence     Past Surgical History:   Procedure Laterality Date  . ANTERIOR FUSION CERVICAL SPINE     C5, C6  . BACK SURGERY    . TONSILLECTOMY    . TUBAL LIGATION     Family History: History reviewed. No pertinent family history.   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 Patient's mother does not know why she was fired. Patient has a daughter in [place] and a son. She is currently living with her parents and aunt.  Denies legal trouble.     History  Alcohol Use  . Yes    Comment: 3-4 times weekly      History  Drug Use  . Types: Marijuana    Social History   Social History  . Marital status: Single    Spouse name: N/A  . Number of children: N/A  . Years of education: N/A   Social History Main Topics  . Smoking status: Current Every Day Smoker    Packs/day: 1.50    Types: Cigarettes  . Smokeless tobacco: Never Used  . Alcohol use Yes     Comment: 3-4 times weekly   . Drug use:     Types: Marijuana  . Sexual activity: No   Other Topics Concern  . None   Social History Narrative  . None   Additional Social History:    History of alcohol / drug use?: Yes Negative Consequences of Use: Financial, Personal relationships, Work / Programmer, multimedia     Current Medications: Current Facility-Administered Medications  Medication Dose Route Frequency Provider Last Rate Last Dose  . acetaminophen (TYLENOL) tablet 650 mg  650 mg Oral Q6H PRN Audery Amel, MD      . alum & mag hydroxide-simeth (MAALOX/MYLANTA) 200-200-20 MG/5ML suspension 30 mL  30 mL Oral Q4H PRN Audery Amel, MD   30 mL at 01/22/16 1813  . aspirin EC tablet 81 mg  81 mg Oral Daily Audery Amel, MD   81 mg at 01/24/16 0801  . ibuprofen (ADVIL,MOTRIN) tablet 400 mg  400 mg Oral Q6H PRN Audery Amel, MD   400 mg at 01/23/16 0844  . levothyroxine (SYNTHROID, LEVOTHROID) tablet 125 mcg  125 mcg Oral QAC  breakfast Jimmy Footman, MD   125 mcg at 01/24/16 0636  . lidocaine (LIDODERM) 5 % 1 patch  1 patch Transdermal Q24H Audery Amel, MD   1 patch at 01/24/16 1137  . lisinopril (PRINIVIL,ZESTRIL) tablet 20 mg  20 mg Oral Daily Jimmy Footman, MD   20 mg at 01/24/16 0801  . LORazepam (ATIVAN) tablet 0.5 mg  0.5 mg Oral TID PRN Jimmy Footman, MD      . magnesium hydroxide (MILK OF MAGNESIA) suspension 30 mL  30 mL Oral Daily PRN Audery Amel, MD      . nicotine (NICODERM CQ - dosed in mg/24 hours) patch 21 mg  21 mg Transdermal Daily Jimmy Footman, MD   21 mg at 01/24/16 0803  . paliperidone (INVEGA) 24 hr tablet 12 mg  12 mg Oral QHS Jimmy Footman, MD      .  simvastatin (ZOCOR) tablet 20 mg  20 mg Oral q1800 Audery AmelJohn T Clapacs, MD   20 mg at 01/23/16 1654  . traZODone (DESYREL) tablet 100 mg  100 mg Oral QHS PRN Audery AmelJohn T Clapacs, MD   100 mg at 01/21/16 2117    Lab Results:  No results found for this or any previous visit (from the past 48 hour(s)).  Blood Alcohol level:  Lab Results  Component Value Date   ETH <5 01/19/2016    Metabolic Disorder Labs: Lab Results  Component Value Date   HGBA1C 5.4 01/20/2016   MPG 108 01/20/2016   Lab Results  Component Value Date   PROLACTIN 54.9 (H) 01/20/2016   Lab Results  Component Value Date   CHOL 139 01/20/2016   TRIG 341 (H) 01/20/2016   HDL 32 (L) 01/20/2016   CHOLHDL 4.3 01/20/2016   VLDL 68 (H) 01/20/2016   LDLCALC 39 01/20/2016    Physical Findings: AIMS: Facial and Oral Movements Muscles of Facial Expression: None, normal Lips and Perioral Area: None, normal Jaw: None, normal Tongue: None, normal,Extremity Movements Upper (arms, wrists, hands, fingers): None, normal Lower (legs, knees, ankles, toes): None, normal, Trunk Movements Neck, shoulders, hips: None, normal, Overall Severity Severity of abnormal movements (highest score from questions above): None,  normal Incapacitation due to abnormal movements: None, normal Patient's awareness of abnormal movements (rate only patient's report): No Awareness, Dental Status Current problems with teeth and/or dentures?: No Does patient usually wear dentures?: No  CIWA:  CIWA-Ar Total: 1 COWS:     Musculoskeletal: Strength & Muscle Tone: within normal limits Gait & Station: normal Patient leans: N/A  Psychiatric Specialty Exam: Physical Exam  Nursing note and vitals reviewed. Constitutional: She appears well-developed and well-nourished.  HENT:  Head: Normocephalic and atraumatic.  Eyes: Conjunctivae are normal. Pupils are equal, round, and reactive to Klier.  Neck: Normal range of motion.  Cardiovascular: Normal heart sounds.   Respiratory: Effort normal. No respiratory distress.  GI: Soft.  Musculoskeletal: Normal range of motion.  Neurological: She is alert.  Skin: Skin is warm and dry.  Psychiatric: She has a normal mood and affect. Her behavior is normal. Her affect is not angry and not labile. Her speech is tangential. She is not agitated. Thought content is not paranoid and not delusional. She expresses impulsivity. She exhibits abnormal recent memory. She is attentive.    Review of Systems  Constitutional: Negative.  Negative for malaise/fatigue.  HENT: Negative.   Eyes: Negative.   Respiratory: Negative.   Cardiovascular: Negative.   Gastrointestinal: Negative.   Genitourinary: Negative.   Musculoskeletal: Positive for back pain.  Skin: Negative.   Neurological: Negative.   Psychiatric/Behavioral: Positive for substance abuse. Negative for depression, hallucinations, memory loss and suicidal ideas. The patient has insomnia. The patient is not nervous/anxious.   All other systems reviewed and are negative.   Blood pressure 118/65, pulse 79, temperature 98.4 F (36.9 C), temperature source Oral, resp. rate 18, height 5\' 2"  (1.575 m), weight 83 kg (182 lb 15.7 oz), SpO2 98 %.Body  mass index is 33.47 kg/m.  General Appearance: Casual  Eye Contact:  Fair  Speech:  Clear and Coherent  Volume:  Normal  Mood:  Depressed  Affect:  Congruent  Thought Process:  Goal Directed  Orientation:  Full (Time, Place, and Person)  Thought Content:  Logical  Suicidal Thoughts:  No  Homicidal Thoughts:  No  Memory:  Immediate;   Good Recent;   Fair Remote;  Fair  Judgement:  Fair  Insight:  Lacking  Psychomotor Activity:  Normal  Concentration:  Concentration: Poor and Attention Span: Poor  Recall:  Poor  Fund of Knowledge:  Fair  Language:  Fair  Akathisia:  No  Handed:  Right  AIMS (if indicated):     Assets:  Communication Skills Desire for Improvement Physical Health Resilience  ADL's:  Intact  Cognition:  WNL  Sleep:  Number of Hours: 7.3     Treatment Plan Summary: Daily contact with patient to assess and evaluate symptoms and progress in treatment and Medication management   Per [employer] report, patient had come down to "help" her daughter who lives here and to give the daughter her car. The daughter kicked the patient out who went to stay in a motel. Police were called by the motel staff due to issues between the patient and their staff, the patient going into areas where she didn't belong and for "hearing voices."  R/o Bipolar disorder: Unclear diagnosis at this time. Patient does not have a prior psychiatric history has never received inpatient or outpatient treatment. Prior to admission with thoughts patient was taking Paxil. We will need to rule out organic conditions before given a final diagnosis of bipolar disorder. We will order an MRI of the brain and also HIV, RPR, B12 and ammonia level. For now continue Invega 6mg  QHS   . Per collateral information obtained from the daughter  the symptoms have been present for at least 30 days (since she came [facility])  -Bipolar d/o: Patient continues to display significant hostility, and severe irritability.  Continues to believe she was set up by her daughter and her daughter's boyfriend. Very hostile during assessment today. Hinda Glatter will be increased to 12 mg a day  -Irritability and agitation: Continue with Ativan 0.5 mg 3 times a day as needed.  -Alcohol use disorder: Patient's daughter report patient drinks heavily.  In need of substance abuse treatment upon discharge  - Alcohol withdrawal: resolved  -Hypertension:  continued with lisinopril 20 mg by mouth daily  -Dyslipidemia: continued with zocor daily  -Cardiovascular disease: Continue aspirin daily  -hypothyroid- Patient continued on levothyroxine  -UTI: treated with fosfomycin  -B12 deficiency: continue B12 Subq for 4 more days  -Tobacco use disorder: nicotine patch 21 mg  Precautions every 15 minute checks  Vital signs daily  Diet low sodium  Hospitalization status involuntary commitment--IVC hearing scheduled for tomorrow  Labs: HIV -, RPR -, B12 low and ammonia level wnl.  HbA1c and lipid panel have been completed  Collateral information has been obtained from the patient's daughter and the patient's mother. See notes above.  Imaging:  MRI of the brain no acute process  Possible d/c in about 7 days.   Jimmy Footman, MD 01/24/2016, 3:03 PM

## 2016-01-24 NOTE — Progress Notes (Signed)
Pt continues to be labile today and pt verbalizes awareness. Became agitated in group, came to this Clinical research associatewriter for PRN ativan. When asked what happened, pt refused to speak about what happened "because you already know." Stated she felt the MHT in charge of group was being "condescending." Pt continues to be disorganized, confused at times, delusional stating "i'm tired to putting my mom on a roller coaster ride and telling her I'm discharged and then I'm not. Am I being discharged today?" Pt has not been told that she would be discharged any days, but states she believes she has been told she is/was discharged. Reports good sleep, good appetite, normal energy and good concentration. Rates depression 0/10, hopelessness 0/10, anxiety 6/10 (low 0-10 high). Denies SI/HI/AVH. Goal for today is "discharge planning" by "participate in group, meals, activity." Medication complaint.   Support and encouragement provided with use of therapeutic communication. Medications administered as ordered with education to include PRN medications ordered. Safety maintained with every 15 minute checks. Will continue to monitor.

## 2016-01-24 NOTE — [facility] Group Notes (Signed)
Date/Time:  01/24/2016  Type of Therapy and Topic:  Group Therapy:  Overcoming Obstacles  Participation Level:  Active  Description of Group:    In this group patients will be encouraged to explore what they see as obstacles to their own wellness and recovery. They will be guided to discuss their thoughts, feelings, and behaviors related to these obstacles. The group will process together ways to cope with barriers, with attention given to specific choices patients can make. Each patient will be challenged to identify changes they are motivated to make in order to overcome their obstacles. This group will be process-oriented, with patients participating in exploration of their own experiences as well as giving and receiving support and challenge from other group members.  Therapeutic Goals: 1. Patient will identify personal and current obstacles as they relate to admission. 2. Patient will identify barriers that currently interfere with their wellness or overcoming obstacles.  3. Patient will identify feelings, thought process and behaviors related to these barriers. 4. Patient will identify two changes they are willing to make to overcome these obstacles:    Summary of Patient Progress Pt able to meet therapeutic goals.  Reports that anxiety and depression have been barriers but also says that she has used alcohol to cope and this has made her life even more difficult.  Therapeutic Modalities:   Cognitive Behavioral Therapy Solution Focused Therapy Motivational Interviewing Relapse Prevention Therapy    Jake SharkSara Vennela Jutte, MSW, LCSW

## 2016-01-25 MED ORDER — DIPHENHYDRAMINE HCL 25 MG PO CAPS
25.0000 mg | ORAL_CAPSULE | Freq: Three times a day (TID) | ORAL | Status: DC | PRN
  Administered 2016-01-28 – 2016-02-06 (×12): 25 mg via ORAL
  Filled 2016-01-25 (×12): qty 1

## 2016-01-25 NOTE — Plan of Care (Signed)
Problem: Safety: Goal: Ability to remain free from injury will improve Outcome: Progressing 15 minute checks maintained for safety, clinical and moral support provided, patient encouraged to continue to express feelings and demonstrate safe care. Patient remains free from harm, will continue to monitor.      

## 2016-01-25 NOTE — Progress Notes (Signed)
The milieu was chaotic at the start of the shift, Ms. Keira called 911 during that period, MHT Ms. Liborio NixonJanice reported. When the writer asked her, patient said, "Yes, I called the operator and she asked me to call 911, so I did because I want my daughter to call me .Marland Kitchen..." Disorganized, poor insight, initially refused 12 mg of Invega tablet, "I normally take 2, when was the dosage increased, no one told me, so I am only taking 2 ... Also I need to shave my mustache since everyone is looking and making fun of me, I need the razor blade in my belonging." Patient later came back for the rest of the medications and returned to bed. Room closer to the nurses' station for close and frequent observation.

## 2016-01-25 NOTE — Progress Notes (Signed)
Patient left fojr court at 1045 and returned from court  At 1240  Patient wanded and searched with no contraband found

## 2016-01-25 NOTE — Progress Notes (Signed)
D: Patient stated slept fair last night .Stated appetite is fair and energy level low. Stated concentration is good . Stated on Depression scale 0 , hopeless 8 and anxiety 10 .( low 0-10 high) Denies suicidal  homicidal ideations  .  No auditory hallucinations  No pain concerns . Appropriate ADL'S. Interacting with peers and staff. Patient voice of withdrawing from drugs and Alcohol  Voice of tremors  agitation and irritability. Voice of the importance  for her  Is better communication  A: Encourage patient participation with unit programming . Instruction  Given on  Medication , verbalize understanding. R: Voice no other concerns. Staff continue to monitor

## 2016-01-25 NOTE — Progress Notes (Signed)
[facility] MD Progress Note  01/25/2016 8:37 AM Catherine Lester  MRN:  [id number]  Subjective:  Patient is a 50 year old female who reports to the ER by way of police after becoming agitated and acting bizarre at a local motel. Patient was a very poor historian due to thought blocking and delusion/confusion. Patient states that she came to [facility] from [place] to help her daughter. There was a dispute between the patient and her daughter and the daughter kicked her out of the house. Patient states that she went to a local motel where the employees started bullying her and not letting her sleep. She states that her daughter and her boyfriend told the employees to bully her. Patient states that she knows her family is here in the hospital admitted. She can hear them talking outside her door so she knows they are here. She also knows that there is an investigation going on. Patient is very agitated and states that she does not need to be here. She wants to go out and smoke. Patient has other delusions including that her phone and motel room was bugged by the police.  11/6 the patient was extremely hostile and irritable during interaction. Continues to state that she is in the hospital because she was set up by her daughter and her daughter's boyfriend. Patient states there is absolutely nothing wrong with her. She believes that we have our own agenda for which we are ordering  a lot of psychiatric medications. Staff report the patient show less hostility and irritability yesterday. However once again today she does not appear much improved from her original admission day last week.  On Sunday night she was telling the nurses that she was able to hear her daughter who she thought was here in the unit. Last night called 911. Staff describes her as very disorganized. This morning patient was very tearful. She was complaining about having something wrong with her body and not with her mind. She feels that being in the  psychiatric unit and is only making things worse for her. She does not feel there is no need for her to be her needed for her to see a psychiatrist upon discharge. Continues to deny suicidality, homicidality or auditory or visual hallucinations. Patient is still very paranoid and suspicious about being set up by family members.  Initially refused Invega last night after the dose was increased to 12 mg. However eventually she took the medication.  Per nursing: The milieu was chaotic at the start of the shift, [name] called 911 during that period, MHT Ms. Liborio Nixon reported. When the writer asked her, patient said, "Yes, I called the operator and she asked me to call 911, so I did because I want my daughter to call me .Marland Kitchen.." Disorganized, poor insight, initially refused 12 mg of Invega tablet, "I normally take 2, when was the dosage increased, no one told me, so I am only taking 2 ... Also I need to shave my mustache since everyone is looking and making fun of me, I need the razor blade in my belonging." Patient later came back for the rest of the medications and returned to bed. Room closer to the nurses' station for close and frequent observation.  Principal Problem: Bipolar 1 disorder, mixed, severe (HCC) Diagnosis:   Patient Active Problem List   Diagnosis Date Noted  . Alcohol use disorder, severe, dependence (HCC) [F10.20] 01/20/2016  . Alcohol withdrawal (HCC) [F10.239] 01/20/2016  . Cannabis use disorder, mild, abuse [  F12.10] 01/20/2016  . Tobacco use disorder [F17.200] 01/20/2016  . HTN (hypertension) [I10] 01/20/2016  . Hypothyroidism [E03.9] 01/20/2016  . Opioid use disorder, moderate, dependence (HCC) [F11.20] 01/20/2016  . Bipolar 1 disorder, mixed, severe (HCC) [F31.63] 01/19/2016   Total Time spent with patient: 30 minutes  Past Psychiatric History: Patient denies past psychiatric history. Patient's mother states that patient has been diagnosed with anxiety. She denies any  previous hospitalizations due to mental health reasons.  Past Medical History:  Past Medical History:  Diagnosis Date  . Anxiety   . Hx of heart valve insufficiency   . Hypertension   . Thyroid disease   . Urinary incontinence     Past Surgical History:  Procedure Laterality Date  . ANTERIOR FUSION CERVICAL SPINE     C5, C6  . BACK SURGERY    . TONSILLECTOMY    . TUBAL LIGATION     Family History: History reviewed. No pertinent family history.   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 Patient's mother does not know why she was fired. Patient has a daughter in [place] and a son. She is currently living with her parents and aunt.  Denies legal trouble.     History  Alcohol Use  . Yes    Comment: 3-4 times weekly      History  Drug Use  . Types: Marijuana    Social History   Social History  . Marital status: Single    Spouse name: N/A  . Number of children: N/A  . Years of education: N/A   Social History Main Topics  . Smoking status: Current Every Day Smoker    Packs/day: 1.50    Types: Cigarettes  . Smokeless tobacco: Never Used  . Alcohol use Yes     Comment: 3-4 times weekly   . Drug use:     Types: Marijuana  . Sexual activity: No   Other Topics Concern  . None   Social History Narrative  . None   Additional Social History:    History of alcohol / drug use?: Yes Negative Consequences of Use: Financial, Personal relationships, Work / Programmer, multimedia     Current Medications: Current Facility-Administered Medications  Medication Dose Route Frequency Provider Last Rate Last Dose  . acetaminophen (TYLENOL) tablet 650 mg  650 mg Oral Q6H PRN Audery Amel, MD      . alum & mag hydroxide-simeth (MAALOX/MYLANTA) 200-200-20 MG/5ML suspension 30 mL  30 mL Oral Q4H PRN Audery Amel, MD   30 mL at 01/22/16 1813  . aspirin EC  tablet 81 mg  81 mg Oral Daily Audery Amel, MD   81 mg at 01/25/16 0807  . cyanocobalamin ((VITAMIN B-12)) injection 1,000 mcg  1,000 mcg Subcutaneous Daily Jimmy Footman, MD   1,000 mcg at 01/25/16 0805  . ibuprofen (ADVIL,MOTRIN) tablet 400 mg  400 mg Oral Q6H PRN Audery Amel, MD   400 mg at 01/25/16 0807  . levothyroxine (SYNTHROID, LEVOTHROID) tablet 125 mcg  125 mcg Oral QAC breakfast Jimmy Footman, MD   125 mcg at 01/25/16 0640  . lidocaine (LIDODERM) 5 % 1 patch  1 patch Transdermal Q24H Audery Amel, MD   1 patch at 01/24/16 1137  . lisinopril (PRINIVIL,ZESTRIL) tablet 20 mg  20 mg Oral Daily Jimmy Footman, MD   20 mg at  01/25/16 0807  . LORazepam (ATIVAN) tablet 0.5 mg  0.5 mg Oral TID PRN Jimmy FootmanAndrea Hernandez-Gonzalez, MD   0.5 mg at 01/24/16 2133  . LORazepam (ATIVAN) tablet 2 mg  2 mg Oral Q4H PRN Audery AmelJohn T Clapacs, MD      . magnesium hydroxide (MILK OF MAGNESIA) suspension 30 mL  30 mL Oral Daily PRN Audery AmelJohn T Clapacs, MD      . nicotine (NICODERM CQ - dosed in mg/24 hours) patch 21 mg  21 mg Transdermal Daily Jimmy FootmanAndrea Hernandez-Gonzalez, MD   21 mg at 01/25/16 0809  . paliperidone (INVEGA) 24 hr tablet 12 mg  12 mg Oral QHS Jimmy FootmanAndrea Hernandez-Gonzalez, MD   12 mg at 01/24/16 2129  . simvastatin (ZOCOR) tablet 20 mg  20 mg Oral q1800 Audery AmelJohn T Clapacs, MD   20 mg at 01/24/16 1833  . traZODone (DESYREL) tablet 100 mg  100 mg Oral QHS PRN Audery AmelJohn T Clapacs, MD   100 mg at 01/24/16 2129    Lab Results:  No results found for this or any previous visit (from the past 48 hour(s)).  Blood Alcohol level:  Lab Results  Component Value Date   ETH <5 01/19/2016    Metabolic Disorder Labs: Lab Results  Component Value Date   HGBA1C 5.4 01/20/2016   MPG 108 01/20/2016   Lab Results  Component Value Date   PROLACTIN 54.9 (H) 01/20/2016   Lab Results  Component Value Date   CHOL 139 01/20/2016   TRIG 341 (H) 01/20/2016   HDL 32 (L) 01/20/2016   CHOLHDL  4.3 01/20/2016   VLDL 68 (H) 01/20/2016   LDLCALC 39 01/20/2016    Physical Findings: AIMS: Facial and Oral Movements Muscles of Facial Expression: None, normal Lips and Perioral Area: None, normal Jaw: None, normal Tongue: None, normal,Extremity Movements Upper (arms, wrists, hands, fingers): None, normal Lower (legs, knees, ankles, toes): None, normal, Trunk Movements Neck, shoulders, hips: None, normal, Overall Severity Severity of abnormal movements (highest score from questions above): None, normal Incapacitation due to abnormal movements: None, normal Patient's awareness of abnormal movements (rate only patient's report): No Awareness, Dental Status Current problems with teeth and/or dentures?: No Does patient usually wear dentures?: No  CIWA:  CIWA-Ar Total: 1 COWS:     Musculoskeletal: Strength & Muscle Tone: within normal limits Gait & Station: normal Patient leans: N/A  Psychiatric Specialty Exam: Physical Exam  Nursing note and vitals reviewed. Constitutional: She appears well-developed and well-nourished.  HENT:  Head: Normocephalic and atraumatic.  Eyes: Conjunctivae are normal. Pupils are equal, round, and reactive to [place].  Neck: Normal range of motion.  Cardiovascular: Normal heart sounds.   Respiratory: Effort normal. No respiratory distress.  GI: Soft.  Musculoskeletal: Normal range of motion.  Neurological: She is alert.  Skin: Skin is warm and dry.  Psychiatric: She has a normal mood and affect. Her behavior is normal. Her affect is not angry and not labile. Her speech is tangential. She is not agitated. Thought content is not paranoid and not delusional. She expresses impulsivity. She exhibits abnormal recent memory. She is attentive.    Review of Systems  Constitutional: Negative.  Negative for malaise/fatigue.  HENT: Negative.   Eyes: Negative.   Respiratory: Negative.   Cardiovascular: Negative.   Gastrointestinal: Negative.   Genitourinary:  Negative.   Musculoskeletal: Positive for back pain.  Skin: Negative.   Neurological: Negative.   Psychiatric/Behavioral: Positive for substance abuse. Negative for depression, hallucinations, memory loss and suicidal ideas. The patient  has insomnia. The patient is not nervous/anxious.   All other systems reviewed and are negative.   Blood pressure 121/70, pulse 76, temperature 98.3 F (36.8 C), temperature source Oral, resp. rate 18, height 5\' 2"  (1.575 m), weight 83 kg (182 lb 15.7 oz), SpO2 100 %.Body mass index is 33.47 kg/m.  General Appearance: Casual  Eye Contact:  Fair  Speech:  Clear and Coherent  Volume:  Normal  Mood:  Depressed  Affect:  Congruent  Thought Process:  Goal Directed  Orientation:  Full (Time, Place, and Person)  Thought Content:  Logical  Suicidal Thoughts:  No  Homicidal Thoughts:  No  Memory:  Immediate;   Good Recent;   Fair Remote;   Fair  Judgement:  Fair  Insight:  Lacking  Psychomotor Activity:  Normal  Concentration:  Concentration: Poor and Attention Span: Poor  Recall:  Poor  Fund of Knowledge:  Fair  Language:  Fair  Akathisia:  No  Handed:  Right  AIMS (if indicated):     Assets:  Communication Skills Desire for Improvement Physical Health Resilience  ADL's:  Intact  Cognition:  WNL  Sleep:  Number of Hours: 6.45     Treatment Plan Summary: Daily contact with patient to assess and evaluate symptoms and progress in treatment and Medication management   [place], patient had come down to "help" her daughter who lives here and to give the daughter her car. The daughter kicked the patient out who went to stay in a motel. Police were called by the motel staff due to issues between the patient and their staff, the patient going into areas where she didn't belong and for "hearing voices."  R/o Bipolar disorder: Unclear diagnosis at this time. Patient does not have a prior psychiatric history has never received  inpatient or outpatient treatment. Prior to admission with thoughts patient was taking Paxil. We will need to rule out organic conditions before given a final diagnosis of bipolar disorder. We will order an MRI of the brain and also HIV, RPR, B12 and ammonia level. For now continue Invega 6mg  QHS   . Per collateral information obtained from the daughter  the symptoms have been present for at least 30 days ([facility]  -Bipolar d/o: Patient continues to display significant hostility, and severe irritability. Continues to believe she was set up by her daughter and her daughter's boyfriend. Very hostile during assessment today. Invega  Increased to 12 mg qhs on 11/6.   No improvement noted in patient's condition.  -Irritability and agitation: Continue with Ativan 0.5 mg 3 times a day as needed.  -Alcohol use disorder: Patient's daughter report patient drinks heavily.  In need of substance abuse treatment upon discharge  - Alcohol withdrawal: resolved  -Hypertension:  continued with lisinopril 20 mg by mouth daily  -Dyslipidemia: continued with zocor daily  -Cardiovascular disease: Continue aspirin daily  -hypothyroid- Patient continued on levothyroxine 125mcg  -UTI: treated with fosfomycin  -B12 deficiency: continue B12 Subq for 3 more days  -Tobacco use disorder: nicotine patch 21 mg  Precautions every 15 minute checks  Vital signs daily  Diet low sodium  Hospitalization status involuntary commitment--IVC hearing today  Labs: HIV -, RPR -, B12 low and ammonia level wnl.  HbA1c and lipid panel have been completed  Collateral information has been obtained from the patient's daughter and the patient's mother. See notes above.  Imaging:  MRI of the brain no acute process  Possible d/c  in about 7 days.   Jimmy Footman, MD 01/25/2016, 8:37 AM

## 2016-01-25 NOTE — Progress Notes (Signed)
Recreation Therapy Notes  Date: 11.07.17 Time: 9:30 am Location: Craft Room  Group Topic: Goal Setting  Goal Area(s) Addresses:  Patient will write one goal. Patient will verbalize benefit of setting goals.  Behavioral Response: Did not attend  Intervention: Step By Step  Activity: Patients were given a worksheet with a foot on it and were instructed to write their goal inside the foot. On the outside of the foot, patients were instructed to write positive statements to help them to keep working on their goals.  Education: LRT educated patients on ways to celebrate once they have reached their goals.  Education Outcome: Patient did not attend group.   Clinical Observations/Feedback: Patient did not attend group.  Jacquelynn CreeGreene,Olena Willy M, LRT/CTRS 01/25/2016 10:12 AM

## 2016-01-25 NOTE — [facility] Group Notes (Signed)
[facility] Group Notes:  (Nursing/MHT/Case Management/Adjunct)  Date:  01/25/2016  Time:  5:24 PM  Type of Therapy:  Psychoeducational Skills  Participation Level:  Active  Participation Quality:  Appropriate, Attentive and Sharing  Affect:  Appropriate  Cognitive:  Alert and Appropriate  Insight:  Appropriate  Engagement in Group:  Engaged  Modes of Intervention:  Discussion, Education and Support  Summary of Progress/Problems:  Catherine Lester [facility] 01/25/2016, 5:24 PM

## 2016-01-26 MED ORDER — VITAMIN B-12 1000 MCG PO TABS
1000.0000 ug | ORAL_TABLET | Freq: Every day | ORAL | Status: DC
  Administered 2016-01-27 – 2016-02-07 (×12): 1000 ug via ORAL
  Filled 2016-01-26 (×12): qty 1

## 2016-01-26 MED ORDER — PALIPERIDONE ER 3 MG PO TB24
12.0000 mg | ORAL_TABLET | Freq: Every day | ORAL | Status: DC
  Administered 2016-01-26 – 2016-02-01 (×7): 12 mg via ORAL
  Filled 2016-01-26 (×7): qty 4

## 2016-01-26 NOTE — Plan of Care (Signed)
Problem: Coping: Goal: Ability to verbalize frustrations and anger appropriately will improve Outcome: Progressing Working on coping skills  Handout given   

## 2016-01-26 NOTE — Tx Team (Signed)
Interdisciplinary Treatment and Diagnostic Plan Update  01/26/2016 Time of Session: 10:30AM Catherine SergeChristine Lester MRN: [id number]  Principal Diagnosis: Bipolar 1 disorder, mixed, severe (HCC)  Secondary Diagnoses: Principal Problem:   Bipolar 1 disorder, mixed, severe (HCC) Active Problems:   Alcohol use disorder, severe, dependence (HCC)   Alcohol withdrawal (HCC)   Cannabis use disorder, mild, abuse   Tobacco use disorder   HTN (hypertension)   Hypothyroidism   Opioid use disorder, moderate, dependence (HCC)   Current Medications:  Current Facility-Administered Medications  Medication Dose Route Frequency Provider Last Rate Last Dose  . acetaminophen (TYLENOL) tablet 650 mg  650 mg Oral Q6H PRN Audery AmelJohn T Clapacs, MD      . alum & mag hydroxide-simeth (MAALOX/MYLANTA) 200-200-20 MG/5ML suspension 30 mL  30 mL Oral Q4H PRN Audery AmelJohn T Clapacs, MD   30 mL at 01/22/16 1813  . aspirin EC tablet 81 mg  81 mg Oral Daily Audery AmelJohn T Clapacs, MD   81 mg at 01/26/16 0839  . cyanocobalamin ((VITAMIN B-12)) injection 1,000 mcg  1,000 mcg Subcutaneous Daily Jimmy FootmanAndrea Hernandez-Gonzalez, MD   1,000 mcg at 01/26/16 0836  . diphenhydrAMINE (BENADRYL) capsule 25 mg  25 mg Oral Q8H PRN Jimmy FootmanAndrea Hernandez-Gonzalez, MD      . ibuprofen (ADVIL,MOTRIN) tablet 400 mg  400 mg Oral Q6H PRN Audery AmelJohn T Clapacs, MD   400 mg at 01/26/16 0552  . levothyroxine (SYNTHROID, LEVOTHROID) tablet 125 mcg  125 mcg Oral QAC breakfast Jimmy FootmanAndrea Hernandez-Gonzalez, MD   125 mcg at 01/26/16 0552  . lidocaine (LIDODERM) 5 % 1 patch  1 patch Transdermal Q24H Audery AmelJohn T Clapacs, MD   1 patch at 01/25/16 1258  . lisinopril (PRINIVIL,ZESTRIL) tablet 20 mg  20 mg Oral Daily Jimmy FootmanAndrea Hernandez-Gonzalez, MD   20 mg at 01/26/16 [id number]  . LORazepam (ATIVAN) tablet 0.5 mg  0.5 mg Oral TID PRN Jimmy FootmanAndrea Hernandez-Gonzalez, MD   0.5 mg at 01/25/16 2113  . LORazepam (ATIVAN) tablet 2 mg  2 mg Oral Q4H PRN Audery AmelJohn T Clapacs, MD      . magnesium hydroxide (MILK OF MAGNESIA)  suspension 30 mL  30 mL Oral Daily PRN Audery AmelJohn T Clapacs, MD      . nicotine (NICODERM CQ - dosed in mg/24 hours) patch 21 mg  21 mg Transdermal Daily Jimmy FootmanAndrea Hernandez-Gonzalez, MD   21 mg at 01/26/16 0840  . paliperidone (INVEGA) 24 hr tablet 12 mg  12 mg Oral QHS Jimmy FootmanAndrea Hernandez-Gonzalez, MD   12 mg at 01/25/16 2113  . simvastatin (ZOCOR) tablet 20 mg  20 mg Oral q1800 Audery AmelJohn T Clapacs, MD   20 mg at 01/25/16 1839  . traZODone (DESYREL) tablet 100 mg  100 mg Oral QHS PRN Audery AmelJohn T Clapacs, MD   100 mg at 01/25/16 2112   PTA Medications: Prescriptions Prior to Admission  Medication Sig Dispense Refill Last Dose  . amoxicillin (AMOXIL) 875 MG tablet Take 875 mg by mouth 2 (two) times daily.     Marland Kitchen. aspirin 81 MG chewable tablet Chew by mouth daily.     . fluticasone (FLONASE) 50 MCG/ACT nasal spray Place into both nostrils daily.     Marland Kitchen. gabapentin (NEURONTIN) 300 MG capsule Take 300 mg by mouth daily.     Marland Kitchen. ibuprofen (ADVIL,MOTRIN) 600 MG tablet Take 600 mg by mouth.     . levothyroxine (SYNTHROID, LEVOTHROID) 125 MCG tablet Take 125 mcg by mouth daily before breakfast.     . lisinopril (PRINIVIL,ZESTRIL) 20 MG tablet Take 20 mg  by mouth daily.     . Multiple Vitamins-Minerals (MULTIVITAMIN WITH MINERALS) tablet Take 1 tablet by mouth daily.     Marland Kitchen NIFEdipine (PROCARDIA-XL/ADALAT CC) 30 MG 24 hr tablet Take 30 mg by mouth daily.     Marland Kitchen NIFEdipine (PROCARDIA-XL/ADALAT-CC/NIFEDICAL-XL) 30 MG 24 hr tablet Take 30 mg by mouth daily.     . Probiotic Product (PROBIOTIC ACIDOPHILUS BEADS PO) Take by mouth.     . Pumpkin Seed-Soy Germ (AZO BLADDER CONTROL/GO-LESS PO) Take by mouth.     . traMADol (ULTRAM) 50 MG tablet Take by mouth every 6 (six) hours as needed.       Patient Stressors: Financial difficulties Marital or family conflict Medication change or noncompliance Substance abuse  Patient Strengths: Manufacturing systems engineer Supportive family/friends  Treatment Modalities: Medication Management, Group  therapy, Case management,  1 to 1 session with clinician, Psychoeducation, Recreational therapy.   Physician Treatment Plan for Primary Diagnosis: Bipolar 1 disorder, mixed, severe (HCC) Long Term Goal(s): Improvement in symptoms so as ready for discharge Improvement in symptoms so as ready for discharge   Short Term Goals: Ability to identify changes in lifestyle to reduce recurrence of condition will improve Ability to verbalize feelings will improve Ability to demonstrate self-control will improve Ability to identify and develop effective coping behaviors will improve Compliance with prescribed medications will improve Ability to identify triggers associated with substance abuse/mental health issues will improve Ability to identify changes in lifestyle to reduce recurrence of condition will improve Ability to verbalize feelings will improve Ability to demonstrate self-control will improve Ability to identify and develop effective coping behaviors will improve Ability to identify triggers associated with substance abuse/mental health issues will improve  Medication Management: Evaluate patient's response, side effects, and tolerance of medication regimen.  Therapeutic Interventions: 1 to 1 sessions, Unit Group sessions and Medication administration.  Evaluation of Outcomes: Progressing  Physician Treatment Plan for Secondary Diagnosis: Principal Problem:   Bipolar 1 disorder, mixed, severe (HCC) Active Problems:   Alcohol use disorder, severe, dependence (HCC)   Alcohol withdrawal (HCC)   Cannabis use disorder, mild, abuse   Tobacco use disorder   HTN (hypertension)   Hypothyroidism   Opioid use disorder, moderate, dependence (HCC)  Long Term Goal(s): Improvement in symptoms so as ready for discharge Improvement in symptoms so as ready for discharge   Short Term Goals: Ability to identify changes in lifestyle to reduce recurrence of condition will improve Ability to  verbalize feelings will improve Ability to demonstrate self-control will improve Ability to identify and develop effective coping behaviors will improve Compliance with prescribed medications will improve Ability to identify triggers associated with substance abuse/mental health issues will improve Ability to identify changes in lifestyle to reduce recurrence of condition will improve Ability to verbalize feelings will improve Ability to demonstrate self-control will improve Ability to identify and develop effective coping behaviors will improve Ability to identify triggers associated with substance abuse/mental health issues will improve     Medication Management: Evaluate patient's response, side effects, and tolerance of medication regimen.  Therapeutic Interventions: 1 to 1 sessions, Unit Group sessions and Medication administration.  Evaluation of Outcomes: Progressing   RN Treatment Plan for Primary Diagnosis: Bipolar 1 disorder, mixed, severe (HCC) Long Term Goal(s): Knowledge of disease and therapeutic regimen to maintain health will improve  Short Term Goals: Ability to remain free from injury will improve, Ability to verbalize frustration and anger appropriately will improve, Ability to demonstrate self-control and Ability to identify and develop effective  coping behaviors will improve  Medication Management: RN will administer medications as ordered by provider, will assess and evaluate patient's response and provide education to patient for prescribed medication. RN will report any adverse and/or side effects to prescribing provider.  Therapeutic Interventions: 1 on 1 counseling sessions, Psychoeducation, Medication administration, Evaluate responses to treatment, Monitor vital signs and CBGs as ordered, Perform/monitor CIWA, COWS, AIMS and Fall Risk screenings as ordered, Perform wound care treatments as ordered.  Evaluation of Outcomes: Progressing   LCSW Treatment Plan  for Primary Diagnosis: Bipolar 1 disorder, mixed, severe (HCC) Long Term Goal(s): Safe transition to appropriate next level of care at discharge, Engage patient in therapeutic group addressing interpersonal concerns.  Short Term Goals: Engage patient in aftercare planning with referrals and resources, Increase social support, Increase emotional regulation, Identify triggers associated with mental health/substance abuse issues and Increase skills for wellness and recovery  Therapeutic Interventions: Assess for all discharge needs, 1 to 1 time with Social worker, Explore available resources and support systems, Assess for adequacy in community support network, Educate family and significant other(s) on suicide prevention, Complete Psychosocial Assessment, Interpersonal group therapy.  Evaluation of Outcomes: Progressing   Progress in Treatment: Attending groups: Yes. Participating in groups: Yes. Taking medication as prescribed: Yes. Toleration medication: Yes. Family/Significant other contact made: No, will contact:  daughter and mother Patient understands diagnosis: Yes. Discussing patient identified problems/goals with staff: Yes. Medical problems stabilized or resolved: Yes. Denies suicidal/homicidal ideation: Yes. Issues/concerns per patient self-inventory: No.   New problem(s) identified: No, Describe:  None identified   New Short Term/Long Term Goal(s):  Discharge Plan or Barriers:   Reason for Continuation of Hospitalization: Depression Hallucinations  Estimated Length of Stay: 3-5 days   Attendees: Patient: Catherine SergeChristine Lester  01/26/2016 11:32 AM  Physician: Dr. Radene JourneyAndrea Hernandez, MD  01/26/2016 11:32 AM  Nursing: Hulan AmatoGwen Farrish, RN 01/26/2016 11:32 AM  RN Care Manager: 01/26/2016 11:32 AM  Social Worker: Hampton AbbotKadijah Makaylee Spielberg, MSW, LCSW-A 01/26/2016 11:32 AM  Recreational Therapist: Hershal CoriaBeth Greene, LRT, CTRS 01/26/2016 11:32 AM   Scribe for Treatment Team: Lynden OxfordKadijah R Triston Skare,  LCSWA 01/26/2016 11:32 AM

## 2016-01-26 NOTE — Progress Notes (Signed)
Recreation Therapy Notes  Date: 11.08.17 Time: 9:30 am Location: Craft Room  Group Topic: Self-esteem  Goal Area(s) Addresses:  Patient will write at least one positive trait about self. Patient will verbalize benefit of having a healthy self-esteem.  Behavioral Response: Attentive, Left early  Intervention: I Am  Activity: Patients were given a worksheet with the letter I on it and were instructed to write as many positive traits about themselves inside the letter.  Education: LRT educated patients on ways they can increase their self-esteem.  Education Outcome: Patient left before LRT educated group.  Clinical Observations/Feedback: At the beginning of group, patietn stated she wanted to take a shower. LRT informed patient she could do that if she wanted to. Patient stated, "I was told I did not have to go to group." LRT confirmed that it was the patient's choice to go to group or not. Patient decided to come to group. Patient wrote positive traits about herself. Patient left group at approximately 10:02 am to take a shower and left her worksheet with LRT. She told LRT she wanted her to read it and give it back. Patient did not return to group.  Jacquelynn CreeGreene,Anginette Espejo M, LRT/CTRS 01/26/2016 11:51 AM

## 2016-01-26 NOTE — [facility] Group Notes (Signed)
[facility] Group Notes:  (Nursing/MHT/Case Management/Adjunct)  Date:  01/26/2016  Time:  4:52 AM  Type of Therapy:  Psychoeducational Skills  Participation Level:  Did Not Attend    Summary of Progress/Problems:  Catherine MilroyLaquanda Y Lester Dec 01/26/2016, 4:52 AM

## 2016-01-26 NOTE — Plan of Care (Signed)
Problem: Safety: Goal: Ability to remain free from injury will improve Outcome: Progressing .Medications non-compliant, patient picks and chooses invega dosage; PRNs given as requested, 15 minute checks maintained for safety, clinical and moral support provided, patient encouraged to continue to express feelings and demonstrate safe care. Patient remains free from harm, will continue to monitor.

## 2016-01-26 NOTE — Progress Notes (Signed)
Hostile, livid, suspicious, paranoid, blaming others, needy, demanding, unpleasant, vocabulary rich in vulgarity and profanity. Minimal interaction with peers, refused and only took 2 tab or 6 mg of Invega, "I told the f**king Dr or whoever the freaking lady that I came back to get the other 2 last night because of commotion going on .Marland Kitchen...so I am not taking it..." Requested for  Ativan 0.5 mg and Trazodone 100 mg with bedtime medications.

## 2016-01-26 NOTE — Progress Notes (Signed)
[facility] MD Progress Note  01/26/2016 9:47 AM Catherine Lester  MRN:  [id number]  Subjective:  Patient is a 50 year old female who reports to the ER by way of police after becoming agitated and acting bizarre at a local motel. Patient was a very poor historian due to thought blocking and delusion/confusion. Patient states that she came to [facility] from [place] to help her daughter. There was a dispute between the patient and her daughter and the daughter kicked her out of the house. Patient states that she went to a local motel where the employees started bullying her and not letting her sleep. She states that her daughter and her boyfriend told the employees to bully her. Patient states that she knows her family is here in the hospital admitted. She can hear them talking outside her door so she knows they are here. She also knows that there is an investigation going on. Patient is very agitated and states that she does not need to be here. She wants to go out and smoke. Patient has other delusions including that her phone and motel room was bugged by the police.  11/6 the patient was extremely hostile and irritable during interaction. Continues to state that she is in the hospital because she was set up by her daughter and her daughter's boyfriend. Patient states there is absolutely nothing wrong with her. She believes that we have our own agenda for which we are ordering  a lot of psychiatric medications. Staff report the patient show less hostility and irritability yesterday. However once again today she does not appear much improved from her original admission day last week.  On Sunday night she was telling the nurses that she was able to hear her daughter who she thought was here in the unit. On Monday night called 911. Continues to deny suicidality, homicidality or auditory or visual hallucinations. Patient is still very paranoid and suspicious about being set up by family members.  Monday night Initially  refused Invega last night after the dose was increased to 12 mg. However eventually she took the medication. Tuesday night refused to take the full dose of Invega (12 mg, only took 6 mg. Nurses stated she was very agitated last night cussing and yelling.  Today patient was seen during treatment team meeting. She was very hostile, very tense, argumentative. Continues to stay there is nothing wrong with her mind it is her body. She says her body is hurting all over nobody is trying to figure out what's going on. The patient has been refusing full dose of Invega because she says she does not need it. Says we all are trying to insult her. It was discussed with the patient that if she continues to refuse full dose of Invega we will restart forcing medications. The patient was initially taking 6 mg of Invega at admission she's been on the same dose since Wednesday of last week with no improvement.  Per nursing: Hostile, livid, suspicious, paranoid, blaming others, needy, demanding, unpleasant, vocabulary rich in vulgarity and profanity. Minimal interaction with peers, refused and only took 2 tab or 6 mg of Invega, "I told the f**king Dr or whoever the freaking lady that I came back to get the other 2 last night because of commotion going on .Marland Kitchen..so I am not taking it..." Requested for  Ativan 0.5 mg and Trazodone 100 mg with bedtime medications.  Principal Problem: Bipolar 1 disorder, mixed, severe (HCC) Diagnosis:   Patient Active Problem List  Diagnosis Date Noted  . Alcohol use disorder, severe, dependence (HCC) [F10.20] 01/20/2016  . Alcohol withdrawal (HCC) [F10.239] 01/20/2016  . Cannabis use disorder, mild, abuse [F12.10] 01/20/2016  . Tobacco use disorder [F17.200] 01/20/2016  . HTN (hypertension) [I10] 01/20/2016  . Hypothyroidism [E03.9] 01/20/2016  . Opioid use disorder, moderate, dependence (HCC) [F11.20] 01/20/2016  . Bipolar 1 disorder, mixed, severe (HCC) [F31.63] 01/19/2016   Total  Time spent with patient: 30 minutes  Past Psychiatric History: Patient denies past psychiatric history. Patient's mother states that patient has been diagnosed with anxiety. She denies any previous hospitalizations due to mental health reasons.  Past Medical History:  Past Medical History:  Diagnosis Date  . Anxiety   . Hx of heart valve insufficiency   . Hypertension   . Thyroid disease   . Urinary incontinence     Past Surgical History:  Procedure Laterality Date  . ANTERIOR FUSION CERVICAL SPINE     C5, C6  . BACK SURGERY    . TONSILLECTOMY    . TUBAL LIGATION     Family History: History reviewed. No pertinent family history.   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 Patient's mother does not know why she was fired. Patient has a daughter in [place] and a son. She is currently living with her parents and aunt.  Denies legal trouble.     History  Alcohol Use  . Yes    Comment: 3-4 times weekly      History  Drug Use  . Types: Marijuana    Social History   Social History  . Marital status: Single    Spouse name: N/A  . Number of children: N/A  . Years of education: N/A   Social History Main Topics  . Smoking status: Current Every Day Smoker    Packs/day: 1.50    Types: Cigarettes  . Smokeless tobacco: Never Used  . Alcohol use Yes     Comment: 3-4 times weekly   . Drug use:     Types: Marijuana  . Sexual activity: No   Other Topics Concern  . None   Social History Narrative  . None   Additional Social History:    History of alcohol / drug use?: Yes Negative Consequences of Use: Financial, Personal relationships, Work / Programmer, multimedia     Current Medications: Current Facility-Administered Medications  Medication Dose Route Frequency Provider Last Rate Last Dose  . acetaminophen (TYLENOL) tablet 650 mg  650 mg Oral Q6H  PRN Audery Amel, MD      . alum & mag hydroxide-simeth (MAALOX/MYLANTA) 200-200-20 MG/5ML suspension 30 mL  30 mL Oral Q4H PRN Audery Amel, MD   30 mL at 01/22/16 1813  . aspirin EC tablet 81 mg  81 mg Oral Daily Audery Amel, MD   81 mg at 01/26/16 0839  . cyanocobalamin ((VITAMIN B-12)) injection 1,000 mcg  1,000 mcg Subcutaneous Daily Jimmy Footman, MD   1,000 mcg at 01/26/16 0836  . diphenhydrAMINE (BENADRYL) capsule 25 mg  25 mg Oral Q8H PRN Jimmy Footman, MD      . ibuprofen (ADVIL,MOTRIN) tablet 400 mg  400 mg Oral Q6H PRN Audery Amel, MD   400 mg at 01/26/16 0552  . levothyroxine (SYNTHROID, LEVOTHROID) tablet 125 mcg  125 mcg Oral QAC breakfast Jimmy Footman, MD   125 mcg  at 01/26/16 0552  . lidocaine (LIDODERM) 5 % 1 patch  1 patch Transdermal Q24H Audery Amel, MD   1 patch at 01/25/16 1258  . lisinopril (PRINIVIL,ZESTRIL) tablet 20 mg  20 mg Oral Daily Jimmy Footman, MD   20 mg at 01/26/16 1610  . LORazepam (ATIVAN) tablet 0.5 mg  0.5 mg Oral TID PRN Jimmy Footman, MD   0.5 mg at 01/25/16 2113  . LORazepam (ATIVAN) tablet 2 mg  2 mg Oral Q4H PRN Audery Amel, MD      . magnesium hydroxide (MILK OF MAGNESIA) suspension 30 mL  30 mL Oral Daily PRN Audery Amel, MD      . nicotine (NICODERM CQ - dosed in mg/24 hours) patch 21 mg  21 mg Transdermal Daily Jimmy Footman, MD   21 mg at 01/26/16 0840  . paliperidone (INVEGA) 24 hr tablet 12 mg  12 mg Oral QHS Jimmy Footman, MD   12 mg at 01/25/16 2113  . simvastatin (ZOCOR) tablet 20 mg  20 mg Oral q1800 Audery Amel, MD   20 mg at 01/25/16 1839  . traZODone (DESYREL) tablet 100 mg  100 mg Oral QHS PRN Audery Amel, MD   100 mg at 01/25/16 2112    Lab Results:  No results found for this or any previous visit (from the past 48 hour(s)).  Blood Alcohol level:  Lab Results  Component Value Date   ETH <5 01/19/2016    Metabolic Disorder  Labs: Lab Results  Component Value Date   HGBA1C 5.4 01/20/2016   MPG 108 01/20/2016   Lab Results  Component Value Date   PROLACTIN 54.9 (H) 01/20/2016   Lab Results  Component Value Date   CHOL 139 01/20/2016   TRIG 341 (H) 01/20/2016   HDL 32 (L) 01/20/2016   CHOLHDL 4.3 01/20/2016   VLDL 68 (H) 01/20/2016   LDLCALC 39 01/20/2016    Physical Findings: AIMS: Facial and Oral Movements Muscles of Facial Expression: None, normal Lips and Perioral Area: None, normal Jaw: None, normal Tongue: None, normal,Extremity Movements Upper (arms, wrists, hands, fingers): None, normal Lower (legs, knees, ankles, toes): None, normal, Trunk Movements Neck, shoulders, hips: None, normal, Overall Severity Severity of abnormal movements (highest score from questions above): None, normal Incapacitation due to abnormal movements: None, normal Patient's awareness of abnormal movements (rate only patient's report): No Awareness, Dental Status Current problems with teeth and/or dentures?: No Does patient usually wear dentures?: No  CIWA:  CIWA-Ar Total: 1 COWS:     Musculoskeletal: Strength & Muscle Tone: within normal limits Gait & Station: normal Patient leans: N/A  Psychiatric Specialty Exam: Physical Exam  Nursing note and vitals reviewed. Constitutional: She appears well-developed and well-nourished.  HENT:  Head: Normocephalic and atraumatic.  Eyes: Conjunctivae are normal. Pupils are equal, round, and reactive to [place]st.  Neck: Normal range of motion.  Cardiovascular: Normal heart sounds.   Respiratory: Effort normal. No respiratory distress.  GI: Soft.  Musculoskeletal: Normal range of motion.  Neurological: She is alert.  Skin: Skin is warm and dry.    Review of Systems  Constitutional: Negative.  Negative for malaise/fatigue.  HENT: Negative.   Eyes: Negative.   Respiratory: Negative.   Cardiovascular: Negative.   Gastrointestinal: Negative.   Genitourinary:  Negative.   Musculoskeletal: Positive for back pain.  Skin: Negative.   Neurological: Negative.   Psychiatric/Behavioral: Positive for substance abuse. Negative for depression, hallucinations, memory loss and suicidal ideas. The patient has  insomnia. The patient is not nervous/anxious.   All other systems reviewed and are negative.   Blood pressure 117/62, pulse 72, temperature 98.4 F (36.9 C), temperature source Oral, resp. rate 18, height 5\' 2"  (1.575 m), weight 83 kg (182 lb 15.7 oz), SpO2 100 %.Body mass index is 33.47 kg/m.  General Appearance: Casual  Eye Contact:  Fair  Speech:  Clear and Coherent  Volume:  Normal  Mood:  Depressed  Affect:  Congruent  Thought Process:  Goal Directed  Orientation:  Full (Time, Place, and Person)  Thought Content:  Logical  Suicidal Thoughts:  No  Homicidal Thoughts:  No  Memory:  Immediate;   Good Recent;   Fair Remote;   Fair  Judgement:  Fair  Insight:  Lacking  Psychomotor Activity:  Normal  Concentration:  Concentration: Poor and Attention Span: Poor  Recall:  Poor  Fund of Knowledge:  Fair  Language:  Fair  Akathisia:  No  Handed:  Right  AIMS (if indicated):     Assets:  Communication Skills Desire for Improvement Physical Health Resilience  ADL's:  Intact  Cognition:  WNL  Sleep:  Number of Hours: 7.3     Treatment Plan Summary: Daily contact with patient to assess and evaluate symptoms and progress in treatment and Medication management   [place], patient had come down to "help" her daughter who lives here and to give the daughter her car. The daughter kicked the patient out who went to stay in a motel. Police were called by the motel staff due to issues between the patient and their staff, the patient going into areas where she didn't belong and for "hearing voices."  R/o Bipolar disorder: Unclear diagnosis at this time. Patient does not have a prior psychiatric history has never received  inpatient or outpatient treatment. Prior to admission with thoughts patient was taking Paxil. We will need to rule out organic conditions before given a final diagnosis of bipolar disorder. We will order an MRI of the brain and also HIV, RPR, B12 and ammonia level. For now continue Invega 6mg  QHS   . Per collateral information obtained from the daughter  the symptoms have been present for at least 30 days ([facility]  -Bipolar d/o: Patient continues to display significant hostility, and severe irritability. Continues to believe she was set up by her daughter and her daughter's boyfriend. Very hostile during assessment today. Invega  Increased to 12 mg qhs on 11/6.   No improvement noted in patient's condition. Patient took 12 mg of Invega on Monday, yesterday she refused the full dose and only took 6 mg. We will offer Invega 12 mg again today and she refuses full dose we will start  non emergency forced medications  -Irritability and agitation: Continue with Ativan 0.5 mg 3 times a day as needed.  -Alcohol use disorder: Patient's daughter report patient drinks heavily.  In need of substance abuse treatment upon discharge  - Alcohol withdrawal: resolved  -Hypertension:  continued with lisinopril 20 mg by mouth daily  -Dyslipidemia: continued with zocor daily  -Cardiovascular disease: Continue aspirin daily  -hypothyroid- Patient continued on levothyroxine 125mcg  -History of chronic pain: Patient is currently receiving a Lidoderm patch apply to lower back, she has also order is for ibuprofen when necessary. She has past history of abusing narcotics.  -UTI: treated with fosfomycin  -B12 deficiency: continue B12 oral  -Tobacco use disorder: nicotine patch 21 mg  Precautions every 15  minute checks  Vital signs daily  Diet low sodium  Hospitalization status involuntary commitment--IVC hearing today  Labs: HIV -, RPR -, B12 low and ammonia level wnl.  HbA1c and  lipid panel have been completed  Collateral information has been obtained from the patient's daughter and the patient's mother. See notes above.  Imaging:  MRI of the brain no acute process  Possible d/c in about 7 days.   Jimmy FootmanHernandez-Gonzalez,  Akoni Parton, MD 01/26/2016, 9:47 AM

## 2016-01-26 NOTE — [facility] Group Notes (Signed)
[facility] Group Notes:  (Nursing/MHT/Case Management/Adjunct)  Date:  01/26/2016  Time:  3:56 PM  Type of Therapy:  Psychoeducational Skills  Participation Level:  Active  Participation Quality:  Appropriate, Sharing and Supportive  Affect:  Appropriate  Cognitive:  Appropriate  Insight:  Appropriate  Engagement in Group:  Engaged and Supportive  Modes of Intervention:  Discussion, Education and Support  Summary of Progress/Problems:  Catherine Lester 01/26/2016, 3:56 PM

## 2016-01-26 NOTE — [facility] Group Notes (Signed)
[facility] LCSW Group Therapy Note  Date/Time:01/25/2016 1:00pm  Type of Therapy/Topic:  Group Therapy:  Feelings about Diagnosis  Participation Level:  Minimal  Mood: Good   Description of Group:    This group will allow patients to explore their thoughts and feelings about diagnoses they have received. Patients will be guided to explore their level of understanding and acceptance of these diagnoses. Facilitator will encourage patients to process their thoughts and feelings about the reactions of others to their diagnosis, and will guide patients in identifying ways to discuss their diagnosis with significant others in their lives. This group will be process-oriented, with patients participating in exploration of their own experiences as well as giving and receiving support and challenge from other group members.   Therapeutic Goals: 1. Patient will demonstrate understanding of diagnosis as evidence by identifying two or more symptoms of the disorder:  2. Patient will be able to express two feelings regarding the diagnosis 3. Patient will demonstrate ability to communicate their needs through discussion and/or role plays  Summary of Patient Progress: Pt unable to achieve therapeutic goals as she did not participate, left early  Therapeutic Modalities:   Cognitive Behavioral Therapy Brief Therapy Feelings Identification   Jake SharkSara Gloriann Riede, MSW, LCSW

## 2016-01-26 NOTE — [facility] Group Notes (Signed)
[facility] LCSW Group Therapy Note  Date/Time:01/26/2016 9:30am  Type of Therapy/Topic:  Group Therapy:  Emotion Regulation  Participation Level:  Minimal  Mood: reports and displays irritability and frustration  Description of Group:    The purpose of this group is to assist patients in learning to regulate negative emotions and experience positive emotions. Patients will be guided to discuss ways in which they have been vulnerable to their negative emotions. These vulnerabilities will be juxtaposed with experiences of positive emotions or situations, and patients challenged to use positive emotions to combat negative ones. Special emphasis will be placed on coping with negative emotions in conflict situations, and patients will process healthy conflict resolution skills.  Therapeutic Goals: 1. Patient will identify two positive emotions or experiences to reflect on in order to balance out negative emotions:  2. Patient will label two or more emotions that they find the most difficult to experience:  3. Patient will be able to demonstrate positive conflict resolution skills through discussion or role plays:   Summary of Patient Progress: Pt left early and expressed her frustration with still being in the hospital and how angry she was that she had not been discharged.  Reports she does not need to be here.    Therapeutic Modalities:   Cognitive Behavioral Therapy Feelings Identification Dialectical Behavioral Therapy  Jake SharkSara Sulay Brymer, MSW, LCSW

## 2016-01-27 NOTE — Plan of Care (Signed)
Problem: Education: Goal: Emotional status will improve Outcome: Not Progressing Pt still tearful and angry. Reports medication is making her more anxious

## 2016-01-27 NOTE — Progress Notes (Signed)
D: Patient currently in group with peers. Calm and cooperative. Appears to be attentive, motivated. No sign of discomfort. A: Staff continue to monitor for safety and other needs.  R: Remains calm and cooperative and safety maintained.

## 2016-01-27 NOTE — [facility] Group Notes (Signed)
[facility] Group Notes:  (Nursing/MHT/Case Management/Adjunct)  Date:  01/27/2016  Time:  4:24 AM  Type of Therapy:  Psychoeducational Skills  Participation Level:  Did Not Attend  Summary of Progress/Problems:  Catherine MilroyLaquanda Y [name] 01/27/2016, 4:24 AM

## 2016-01-27 NOTE — Progress Notes (Signed)
Recreation Therapy Notes  Date: 11.09.17 Time: 9:30 am Location: Craft Room  Group Topic: Leisure Education  Goal Area(s) Addresses:  Patient will identify activities for each letter of the alphabet. Patient will verbalize ability to integrate positive leisure into life post d/c. Patient will verbalize ability to use leisure as a coping skill.  Behavioral Response: Did not attend  Intervention: Leisure Alphabet  Activity: Patients were given a Leisure Alphabet worksheet and were instructed to write healthy leisure activities for each letter of the alphabet.  Education: LRT educated patients on what they need to participate in leisure.  Education Outcome: Patient did not attend group.   Clinical Observations/Feedback: Patient did not attend group.  Hajime Asfaw M, LRT/CTRS 01/27/2016 10:25 AM 

## 2016-01-27 NOTE — [facility] Group Notes (Signed)
[facility] LCSW Group Therapy Note  Type of Therapy and Topic:  Group Therapy:  Goals Group: SMART Goals  Participation Level:  Patient attended group but did not participate during group.   Description of Group:   The purpose of a daily goals group is to assist and guide patients in setting recovery/wellness-related goals.  The objective is to set goals as they relate to the crisis in which they were admitted. Patients will be using SMART goal modalities to set measurable goals.  Characteristics of realistic goals will be discussed and patients will be assisted in setting and processing how one will reach their goal. Facilitator will also assist patients in applying interventions and coping skills learned in psycho-education groups to the SMART goal and process how one will achieve defined goal.  Therapeutic Goals: -Patients will develop and document one goal related to or their crisis in which brought them into treatment. -Patients will be guided by LCSW using SMART goal setting modality in how to set a measurable, attainable, realistic and time sensitive goal.  -Patients will process barriers in reaching goal. -Patients will process interventions in how to overcome and successful in reaching goal.   Summary of Patient Progress:  Patient Goal: Patient did not participate in the group discussion and left group after about 5 minutes and did not return to group.    Therapeutic Modalities:   Motivational Interviewing Engineer, manufacturing systemsCognitive Behavioral Therapy Crisis Intervention Model SMART goals setting  Leighton Brickley G. Garnette CzechSampson MSW, [facility]WA 01/27/2016 10:43 AM

## 2016-01-27 NOTE — Progress Notes (Signed)
Patient tearful and anxious during assessment. Continues to be angry. Reports taking all the medication is making her angry. Pt request to only take partial dose of invega. Pt reports being anxious and angry, prn given with partial relief. Pt isolates to self and room. Pt has minimal interaction with staff and peers. Pt did attend some groups, and is med compliant. Denies SI, HI, AVH. Encouragement and support offered. Pt receptive and remains safe with q 15 min checks.

## 2016-01-27 NOTE — [facility] Group Notes (Signed)
[facility] LCSW Group Therapy  01/27/2016 2:27 PM  Type of Therapy:  Group Therapy  Participation Level:  Patient did not attend group. CSW invited patient to group.   Summary of Progress/Problems: Balance in life: Patients will discuss the concept of balance and how it looks and feels to be unbalanced. Pt will identify areas in their life that is unbalanced and ways to become more balanced.    Makynna Manocchio G. Garnette CzechSampson MSW, LCSWA 01/27/2016, 2:27 PM

## 2016-01-27 NOTE — Progress Notes (Signed)
Patient continues to be angry and wants to be discharged "right now!, there is nothing wrong with my head, I know what's going on here, liar, liars". Accusing staffs of plotting against her to keep her here; still paranoid, limited interactions with others. PRNs (Trazodone 100 mg (sleep), Ativan 0.5 mg (Anxiety) and Ibuprofen 400 mg (5/10 moderate, generalized pain). Will continue to maintain safety.

## 2016-01-27 NOTE — Plan of Care (Signed)
Problem: Safety: Goal: Ability to remain free from injury will improve Outcome: Progressing 15 minute checks maintained for safety, clinical and moral support provided, patient encouraged to continue to express feelings and demonstrate safe care. Patient remains free from harm, will continue to monitor.      

## 2016-01-27 NOTE — Progress Notes (Signed)
[facility] MD Progress Note  01/27/2016 7:22 AM Catherine Lester  MRN:  [id number]  Subjective:  Patient is a 50 year old female who reports to the ER by way of police after becoming agitated and acting bizarre at a local motel. Patient was a very poor historian due to thought blocking and delusion/confusion. Patient states that she came to [facility] from [place] to help her daughter. There was a dispute between the patient and her daughter and the daughter kicked her out of the house. Patient states that she went to a local motel where the employees started bullying her and not letting her sleep. She states that her daughter and her boyfriend told the employees to bully her. Patient states that she knows her family is here in the hospital admitted. She can hear them talking outside her door so she knows they are here. She also knows that there is an investigation going on. Patient is very agitated and states that she does not need to be here. She wants to go out and smoke. Patient has other delusions including that her phone and motel room was bugged by the police.  11/6 the patient was extremely hostile and irritable during interaction. Continues to state that she is in the hospital because she was set up by her daughter and her daughter's boyfriend. Patient states there is absolutely nothing wrong with her. She believes that we have our own agenda for which we are ordering  a lot of psychiatric medications. Staff report the patient show less hostility and irritability yesterday. However once again today she does not appear much improved from her original admission day last week.  Continues to deny suicidality, homicidality or auditory or visual hallucinations. Patient is still very paranoid and suspicious about being set up by family members.  Monday night Initially refused Invega last night after the dose was increased to 12 mg. However eventually she took the medication. Tuesday night refused to take the full dose  of Invega (12 mg, only took 6 mg. Nurses stated she was very agitated last night cussing and yelling.  11/8 patient was seen during treatment team meeting. She was very hostile, very tense, argumentative. Continues to stay there is nothing wrong with her mind it is her body. She says her body is hurting all over nobody is trying to figure out what's going on. The patient has been refusing full dose of Invega because she says she does not need it. Says we all are trying to insult her. It was discussed with the patient that if she continues to refuse full dose of Invega we will restart forcing medications. The patient was initially taking 6 mg of Invega at admission she's been on the same dose since Wednesday of last week with no improvement.  11/9 patient was again hostile, argumentative, cussing, constantly using profanity during assessment today. I do not feel any improvement from admission. Dose of Hinda Glatter has been increased to 12 mg but patient continues to state that she does not want to take the dose she complains of having side effects such as floaters in her eyes. She was reminded that he she is noncompliant with the full dose I will recommend forced medications.  There no evidence of major side effects. Patient is "about taking medications, she feels she doesn't need them, she feels that absolutely no reason for her to be in the hospital. She lives him keeping her here just because I'm listening to her daughter and not to her. She does  not understand but they have concerns with psychosis and significant mood instability. Patient believes her daughter has checked into the unit and is now a patient.  Pt participated in 2 groups yesterday  Per nursing:  11/5 she was telling the nurses that she was able to hear her daughter who she thought was here in the unit.  11/06 Pt called 911.   11/07 Hostile, livid, suspicious, paranoid, blaming others, needy, demanding, unpleasant, vocabulary rich in  vulgarity and profanity. Minimal interaction with peers, refused and only took 2 tab or 6 mg of Invega, "I told the f**king Dr or whoever the freaking lady that I came back to get the other 2 last night because of commotion going on .Marland Kitchen...so I am not taking it..." Requested for  Ativan 0.5 mg and Trazodone 100 mg with bedtime medications.  11/08 Patient continues to be angry and wants to be discharged "right now!, there is nothing wrong with my head, I know what's going on here, liar, liars". Accusing staffs of plotting against her to keep her here; still paranoid, limited interactions with others. PRNs (Trazodone 100 mg (sleep), Ativan 0.5 mg (Anxiety) and Ibuprofen 400 mg (5/10 moderate, generalized pain). Will continue to maintain safety.   Principal Problem: Bipolar 1 disorder, mixed, severe (HCC) Diagnosis:   Patient Active Problem List   Diagnosis Date Noted  . Alcohol use disorder, severe, dependence (HCC) [F10.20] 01/20/2016  . Alcohol withdrawal (HCC) [F10.239] 01/20/2016  . Cannabis use disorder, mild, abuse [F12.10] 01/20/2016  . Tobacco use disorder [F17.200] 01/20/2016  . HTN (hypertension) [I10] 01/20/2016  . Hypothyroidism [E03.9] 01/20/2016  . Opioid use disorder, moderate, dependence (HCC) [F11.20] 01/20/2016  . Bipolar 1 disorder, mixed, severe (HCC) [F31.63] 01/19/2016   Total Time spent with patient: 30 minutes  Past Psychiatric History: Patient denies past psychiatric history. Patient's mother states that patient has been diagnosed with anxiety. She denies any previous hospitalizations due to mental health reasons.  Past Medical History:  Past Medical History:  Diagnosis Date  . Anxiety   . Hx of heart valve insufficiency   . Hypertension   . Thyroid disease   . Urinary incontinence     Past Surgical History:  Procedure Laterality Date  . ANTERIOR FUSION CERVICAL SPINE     C5, C6  . BACK SURGERY    . TONSILLECTOMY    . TUBAL LIGATION     Family History:  History reviewed. No pertinent family history.   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 Patient's mother does not know why she was fired. Patient has a daughter in [place] and a son. She is currently living with her parents and aunt.  Denies legal trouble.     History  Alcohol Use  . Yes    Comment: 3-4 times weekly      History  Drug Use  . Types: Marijuana    Social History   Social History  . Marital status: Single    Spouse name: N/A  . Number of children: N/A  . Years of education: N/A   Social History Main Topics  . Smoking status: Current Every Day Smoker    Packs/day: 1.50    Types: Cigarettes  . Smokeless tobacco: Never Used  . Alcohol use Yes     Comment: 3-4 times weekly   . Drug use:  Types: Marijuana  . Sexual activity: No   Other Topics Concern  . None   Social History Narrative  . None   Additional Social History:    History of alcohol / drug use?: Yes Negative Consequences of Use: Financial, Personal relationships, Work / Programmer, multimedia     Current Medications: Current Facility-Administered Medications  Medication Dose Route Frequency Provider Last Rate Last Dose  . acetaminophen (TYLENOL) tablet 650 mg  650 mg Oral Q6H PRN Audery Amel, MD      . alum & mag hydroxide-simeth (MAALOX/MYLANTA) 200-200-20 MG/5ML suspension 30 mL  30 mL Oral Q4H PRN Audery Amel, MD   30 mL at 01/22/16 1813  . aspirin EC tablet 81 mg  81 mg Oral Daily Audery Amel, MD   81 mg at 01/26/16 0839  . diphenhydrAMINE (BENADRYL) capsule 25 mg  25 mg Oral Q8H PRN Jimmy Footman, MD      . ibuprofen (ADVIL,MOTRIN) tablet 400 mg  400 mg Oral Q6H PRN Audery Amel, MD   400 mg at 01/26/16 2022  . levothyroxine (SYNTHROID, LEVOTHROID) tablet 125 mcg  125 mcg Oral QAC breakfast Jimmy Footman, MD   125 mcg at 01/27/16  0630  . lidocaine (LIDODERM) 5 % 1 patch  1 patch Transdermal Q24H Audery Amel, MD   1 patch at 01/26/16 1237  . lisinopril (PRINIVIL,ZESTRIL) tablet 20 mg  20 mg Oral Daily Jimmy Footman, MD   20 mg at 01/26/16 1610  . LORazepam (ATIVAN) tablet 0.5 mg  0.5 mg Oral TID PRN Jimmy Footman, MD   0.5 mg at 01/26/16 2021  . LORazepam (ATIVAN) tablet 2 mg  2 mg Oral Q4H PRN Audery Amel, MD   2 mg at 01/26/16 1616  . magnesium hydroxide (MILK OF MAGNESIA) suspension 30 mL  30 mL Oral Daily PRN Audery Amel, MD      . nicotine (NICODERM CQ - dosed in mg/24 hours) patch 21 mg  21 mg Transdermal Daily Jimmy Footman, MD   21 mg at 01/26/16 0840  . paliperidone (INVEGA) 24 hr tablet 12 mg  12 mg Oral Daily Jimmy Footman, MD   12 mg at 01/26/16 1236  . simvastatin (ZOCOR) tablet 20 mg  20 mg Oral q1800 Audery Amel, MD   20 mg at 01/26/16 1933  . traZODone (DESYREL) tablet 100 mg  100 mg Oral QHS PRN Audery Amel, MD   100 mg at 01/26/16 2022  . vitamin B-12 (CYANOCOBALAMIN) tablet 1,000 mcg  1,000 mcg Oral Daily Jimmy Footman, MD        Lab Results:  No results found for this or any previous visit (from the past 48 hour(s)).  Blood Alcohol level:  Lab Results  Component Value Date   ETH <5 01/19/2016    Metabolic Disorder Labs: Lab Results  Component Value Date   HGBA1C 5.4 01/20/2016   MPG 108 01/20/2016   Lab Results  Component Value Date   PROLACTIN 54.9 (H) 01/20/2016   Lab Results  Component Value Date   CHOL 139 01/20/2016   TRIG 341 (H) 01/20/2016   HDL 32 (L) 01/20/2016   CHOLHDL 4.3 01/20/2016   VLDL 68 (H) 01/20/2016   LDLCALC 39 01/20/2016    Physical Findings: AIMS: Facial and Oral Movements Muscles of Facial Expression: None, normal Lips and Perioral Area: None, normal Jaw: None, normal Tongue: None, normal,Extremity Movements Upper (arms, wrists, hands, fingers): None, normal Lower (legs, knees,  ankles, toes):  None, normal, Trunk Movements Neck, shoulders, hips: None, normal, Overall Severity Severity of abnormal movements (highest score from questions above): None, normal Incapacitation due to abnormal movements: None, normal Patient's awareness of abnormal movements (rate only patient's report): No Awareness, Dental Status Current problems with teeth and/or dentures?: No Does patient usually wear dentures?: No  CIWA:  CIWA-Ar Total: 1 COWS:     Musculoskeletal: Strength & Muscle Tone: within normal limits Gait & Station: normal Patient leans: N/A  Psychiatric Specialty Exam: Physical Exam  Nursing note and vitals reviewed. Constitutional: She appears well-developed and well-nourished.  HENT:  Head: Normocephalic and atraumatic.  Eyes: Conjunctivae are normal. Pupils are equal, round, and reactive to Grabinski.  Neck: Normal range of motion.  Cardiovascular: Normal heart sounds.   Respiratory: Effort normal. No respiratory distress.  GI: Soft.  Musculoskeletal: Normal range of motion.  Neurological: She is alert.  Skin: Skin is warm and dry.    Review of Systems  Constitutional: Negative.  Negative for malaise/fatigue.  HENT: Negative.   Eyes: Negative.   Respiratory: Negative.   Cardiovascular: Negative.   Gastrointestinal: Negative.   Genitourinary: Negative.   Musculoskeletal: Positive for back pain.  Skin: Negative.   Neurological: Negative.   Psychiatric/Behavioral: Positive for substance abuse. Negative for depression, hallucinations, memory loss and suicidal ideas. The patient has insomnia. The patient is not nervous/anxious.   All other systems reviewed and are negative.   Blood pressure 117/62, pulse 72, temperature 98.4 F (36.9 C), temperature source Oral, resp. rate 18, height 5\' 2"  (1.575 m), weight 83 kg (182 lb 15.7 oz), SpO2 100 %.Body mass index is 33.47 kg/m.  General Appearance: Casual  Eye Contact:  Fair  Speech:  Clear and Coherent  Volume:   Normal  Mood:  Depressed  Affect:  Congruent  Thought Process:  Goal Directed  Orientation:  Full (Time, Place, and Person)  Thought Content:  Logical  Suicidal Thoughts:  No  Homicidal Thoughts:  No  Memory:  Immediate;   Good Recent;   Fair Remote;   Fair  Judgement:  Fair  Insight:  Lacking  Psychomotor Activity:  Normal  Concentration:  Concentration: Poor and Attention Span: Poor  Recall:  Poor  Fund of Knowledge:  Fair  Language:  Fair  Akathisia:  No  Handed:  Right  AIMS (if indicated):     Assets:  Communication Skills Desire for Improvement Physical Health Resilience  ADL's:  Intact  Cognition:  WNL  Sleep:  Number of Hours: 8     Treatment Plan Summary: Daily contact with patient to assess and evaluate symptoms and progress in treatment and Medication management   No improvement since admission  Per [employer] report, patient had come down to "help" her daughter who lives here and to give the daughter her car. The daughter kicked the patient out who went to stay in a motel. Police were called by the motel staff due to issues between the patient and their staff, the patient going into areas where she didn't belong and for "hearing voices."  R/o Bipolar disorder: Unclear diagnosis at this time. Patient does not have a prior psychiatric history has never received inpatient or outpatient treatment. Prior to admission with thoughts patient was taking Paxil. Per collateral information obtained from the daughter  the symptoms have been present for at least 30 days (since she came to [facility])  -Bipolar d/o: Patient continues to display significant hostility, and severe irritability. Continues to believe she was set up by  her daughter and her daughter's boyfriend. Very hostile during assessment today. Invega  Increased to 12 mg qhs on 11/6.    No improvement noted in patient's condition. Patient took 12 mg of Invega on Monday, Tuesday she refused the full dose and only  took 6 mg.  Wednesday pt was told that if she didn't complied with full dose of invega we were going to order non emergency forced medications.  Pt finally took full dose  -Irritability and agitation: Continue with Ativan 0.5 mg 3 times a day as needed.  -Alcohol use disorder: Patient's daughter report patient drinks heavily.  In need of substance abuse treatment upon discharge  - Alcohol withdrawal: resolved  -Hypertension:  continued with lisinopril 20 mg by mouth daily  -Dyslipidemia: continued with zocor daily  -Cardiovascular disease: Continue aspirin daily  -hypothyroid- Patient continued on levothyroxine 125mcg  -History of chronic pain: Patient is currently receiving a Lidoderm patch apply to lower back, she has also order is for ibuprofen when necessary. She has past history of abusing narcotics.  -UTI: treated with fosfomycin  -B12 deficiency: continue B12 oral  -Tobacco use disorder: nicotine patch 21 mg  Precautions every 15 minute checks  Vital signs daily  Diet low sodium  Hospitalization status involuntary commitment--IVC hearing was held on 11/7  Labs: HIV -, RPR -, B12 low and ammonia level wnl.  HbA1c and lipid panel have been completed  Collateral information has been obtained from the patient's daughter and the patient's mother. See notes above. Spoke with pt's mother again on 11/8. Pt's mother doesn't understand current condition.  She is requesting for the pt to be discharge as pt's brother in law dies and pt's uncle is in hospice.  Imaging:  MRI of the brain no acute process  Possible d/c in about 7 days.   Jimmy FootmanHernandez-Gonzalez,  Jasenia Weilbacher, MD 01/27/2016, 7:22 AM

## 2016-01-28 NOTE — Plan of Care (Signed)
Problem: Coping: Goal: Ability to cope will improve Outcome: Not Progressing Displaying agitations, irritability.

## 2016-01-28 NOTE — Progress Notes (Signed)
D: Patient presented to the medication room, upset, saying "I do not want to take any more of those medications, I just want my Ativan and ...give me the 2 mg please.Marland Kitchen.and my Trazodone, that's it...".  Guarded and refusing to discuss what triggered her anger. Alert and oriented but irritable.  A:Patient was encouraged to discuss her main concern but did not want to reveal why she was getting irritated.  Received Ativan 2mg  as prescribed and was less agitated once Ativan given. Remained in the dayroom with peers then went to bed. Safety precautions reinforced.  R: Patient calmed down after medication administration. Behavior observed and no tendency to aggression toward staff and peers.

## 2016-01-28 NOTE — Progress Notes (Signed)
Patient stated that she is very anxious because of his medicines.took only half dose of Invega.PRN given for anxiety.Patient angry,hostile states "I don't need to be here."Stayed in room.No interaction with peers.Patient got irritated with overhearing the telephone conversations outside her room.Patient states "I am not comfortable in the day room because people are making fun of me."Support & encouragement given.Appeite & energy level good.

## 2016-01-28 NOTE — Plan of Care (Signed)
Problem: Safety: Goal: Ability to remain free from injury will improve Outcome: Progressing Patient remains safe on the unit, denying suicidal/homicidal thoughts

## 2016-01-28 NOTE — Progress Notes (Cosign Needed)
D: Patient stayed in bed sleeping quietly. Slept throughout the night and had no complaint. A: Patient monitored for safety per unit protocol. Staff continue to provide emotional support and encouragements. R: Patient's safety and security maintained.

## 2016-01-28 NOTE — [facility] Group Notes (Signed)
[facility] LCSW Group Therapy  01/28/2016 2:10 PM  Type of Therapy:  Group Therapy  Participation Level:  Patient did not attend group. CSW invited patient to group.   Summary of Progress/Problems: Feelings around Relapse. Group members discussed the meaning of relapse and shared personal stories of relapse, how it affected them and others, and how they perceived themselves during this time. Group members were encouraged to identify triggers, warning signs and coping skills used when facing the possibility of relapse. Social supports were discussed and explored in detail. Patients also discussed facing disappointment and how that can trigger someone to relapse.   Alyxis Grippi G. Garnette CzechSampson MSW, LCSWA 01/28/2016, 2:11 PM

## 2016-01-28 NOTE — Tx Team (Signed)
Interdisciplinary Treatment and Diagnostic Plan Update  01/28/2016 Time of Session: 10:30AM Catherine Lester MRN: [id number]  Principal Diagnosis: Bipolar 1 disorder, mixed, severe (HCC)  Secondary Diagnoses: Principal Problem:   Bipolar 1 disorder, mixed, severe (HCC) Active Problems:   Alcohol use disorder, severe, dependence (HCC)   Alcohol withdrawal (HCC)   Cannabis use disorder, mild, abuse   Tobacco use disorder   HTN (hypertension)   Hypothyroidism   Opioid use disorder, moderate, dependence (HCC)   Current Medications:  Current Facility-Administered Medications  Medication Dose Route Frequency Provider Last Rate Last Dose  . acetaminophen (TYLENOL) tablet 650 mg  650 mg Oral Q6H PRN Audery AmelJohn T Clapacs, MD      . alum & mag hydroxide-simeth (MAALOX/MYLANTA) 200-200-20 MG/5ML suspension 30 mL  30 mL Oral Q4H PRN Audery AmelJohn T Clapacs, MD   30 mL at 01/28/16 1107  . aspirin EC tablet 81 mg  81 mg Oral Daily Audery AmelJohn T Clapacs, MD   81 mg at 01/28/16 0854  . diphenhydrAMINE (BENADRYL) capsule 25 mg  25 mg Oral Q8H PRN Jimmy FootmanAndrea Hernandez-Gonzalez, MD      . ibuprofen (ADVIL,MOTRIN) tablet 400 mg  400 mg Oral Q6H PRN Audery AmelJohn T Clapacs, MD   400 mg at 01/28/16 0854  . levothyroxine (SYNTHROID, LEVOTHROID) tablet 125 mcg  125 mcg Oral QAC breakfast Jimmy FootmanAndrea Hernandez-Gonzalez, MD   125 mcg at 01/28/16 0641  . lidocaine (LIDODERM) 5 % 1 patch  1 patch Transdermal Q24H Audery AmelJohn T Clapacs, MD   1 patch at 01/26/16 1237  . lisinopril (PRINIVIL,ZESTRIL) tablet 20 mg  20 mg Oral Daily Jimmy FootmanAndrea Hernandez-Gonzalez, MD   20 mg at 01/28/16 0854  . LORazepam (ATIVAN) tablet 0.5 mg  0.5 mg Oral TID PRN Jimmy FootmanAndrea Hernandez-Gonzalez, MD   0.5 mg at 01/26/16 2021  . LORazepam (ATIVAN) tablet 2 mg  2 mg Oral Q4H PRN Audery AmelJohn T Clapacs, MD   2 mg at 01/28/16 [id number]  . magnesium hydroxide (MILK OF MAGNESIA) suspension 30 mL  30 mL Oral Daily PRN Audery AmelJohn T Clapacs, MD      . nicotine (NICODERM CQ - dosed in mg/24 hours) patch 21 mg  21 mg  Transdermal Daily Jimmy FootmanAndrea Hernandez-Gonzalez, MD   21 mg at 01/28/16 0854  . paliperidone (INVEGA) 24 hr tablet 12 mg  12 mg Oral Daily Jimmy FootmanAndrea Hernandez-Gonzalez, MD   12 mg at 01/28/16 0854  . simvastatin (ZOCOR) tablet 20 mg  20 mg Oral q1800 Audery AmelJohn T Clapacs, MD   20 mg at 01/27/16 1658  . traZODone (DESYREL) tablet 100 mg  100 mg Oral QHS PRN Audery AmelJohn T Clapacs, MD   100 mg at 01/27/16 2136  . vitamin B-12 (CYANOCOBALAMIN) tablet 1,000 mcg  1,000 mcg Oral Daily Jimmy FootmanAndrea Hernandez-Gonzalez, MD   1,000 mcg at 01/28/16 [id number]   PTA Medications: Prescriptions Prior to Admission  Medication Sig Dispense Refill Last Dose  . amoxicillin (AMOXIL) 875 MG tablet Take 875 mg by mouth 2 (two) times daily.     Marland Kitchen. aspirin 81 MG chewable tablet Chew by mouth daily.     . fluticasone (FLONASE) 50 MCG/ACT nasal spray Place into both nostrils daily.     Marland Kitchen. gabapentin (NEURONTIN) 300 MG capsule Take 300 mg by mouth daily.     Marland Kitchen. ibuprofen (ADVIL,MOTRIN) 600 MG tablet Take 600 mg by mouth.     . levothyroxine (SYNTHROID, LEVOTHROID) 125 MCG tablet Take 125 mcg by mouth daily before breakfast.     . lisinopril (PRINIVIL,ZESTRIL) 20 MG tablet  Take 20 mg by mouth daily.     . Multiple Vitamins-Minerals (MULTIVITAMIN WITH MINERALS) tablet Take 1 tablet by mouth daily.     Marland Kitchen. NIFEdipine (PROCARDIA-XL/ADALAT CC) 30 MG 24 hr tablet Take 30 mg by mouth daily.     Marland Kitchen. NIFEdipine (PROCARDIA-XL/ADALAT-CC/NIFEDICAL-XL) 30 MG 24 hr tablet Take 30 mg by mouth daily.     . Probiotic Product (PROBIOTIC ACIDOPHILUS BEADS PO) Take by mouth.     . Pumpkin Seed-Soy Germ (AZO BLADDER CONTROL/GO-LESS PO) Take by mouth.     . traMADol (ULTRAM) 50 MG tablet Take by mouth every 6 (six) hours as needed.       Patient Stressors: Financial difficulties Marital or family conflict Medication change or noncompliance Substance abuse  Patient Strengths: Manufacturing systems engineerCommunication skills Supportive family/friends  Treatment Modalities: Medication Management,  Group therapy, Case management,  1 to 1 session with clinician, Psychoeducation, Recreational therapy.   Physician Treatment Plan for Primary Diagnosis: Bipolar 1 disorder, mixed, severe (HCC) Long Term Goal(s): Improvement in symptoms so as ready for discharge Improvement in symptoms so as ready for discharge   Short Term Goals: Ability to identify changes in lifestyle to reduce recurrence of condition will improve Ability to verbalize feelings will improve Ability to demonstrate self-control will improve Ability to identify and develop effective coping behaviors will improve Compliance with prescribed medications will improve Ability to identify triggers associated with substance abuse/mental health issues will improve Ability to identify changes in lifestyle to reduce recurrence of condition will improve Ability to verbalize feelings will improve Ability to demonstrate self-control will improve Ability to identify and develop effective coping behaviors will improve Ability to identify triggers associated with substance abuse/mental health issues will improve  Medication Management: Evaluate patient's response, side effects, and tolerance of medication regimen.  Therapeutic Interventions: 1 to 1 sessions, Unit Group sessions and Medication administration.  Evaluation of Outcomes: Progressing  Physician Treatment Plan for Secondary Diagnosis: Principal Problem:   Bipolar 1 disorder, mixed, severe (HCC) Active Problems:   Alcohol use disorder, severe, dependence (HCC)   Alcohol withdrawal (HCC)   Cannabis use disorder, mild, abuse   Tobacco use disorder   HTN (hypertension)   Hypothyroidism   Opioid use disorder, moderate, dependence (HCC)  Long Term Goal(s): Improvement in symptoms so as ready for discharge Improvement in symptoms so as ready for discharge   Short Term Goals: Ability to identify changes in lifestyle to reduce recurrence of condition will improve Ability to  verbalize feelings will improve Ability to demonstrate self-control will improve Ability to identify and develop effective coping behaviors will improve Compliance with prescribed medications will improve Ability to identify triggers associated with substance abuse/mental health issues will improve Ability to identify changes in lifestyle to reduce recurrence of condition will improve Ability to verbalize feelings will improve Ability to demonstrate self-control will improve Ability to identify and develop effective coping behaviors will improve Ability to identify triggers associated with substance abuse/mental health issues will improve     Medication Management: Evaluate patient's response, side effects, and tolerance of medication regimen.  Therapeutic Interventions: 1 to 1 sessions, Unit Group sessions and Medication administration.  Evaluation of Outcomes: Progressing   RN Treatment Plan for Primary Diagnosis: Bipolar 1 disorder, mixed, severe (HCC) Long Term Goal(s): Knowledge of disease and therapeutic regimen to maintain health will improve  Short Term Goals: Ability to remain free from injury will improve, Ability to verbalize frustration and anger appropriately will improve, Ability to demonstrate self-control and Ability to identify  and develop effective coping behaviors will improve  Medication Management: RN will administer medications as ordered by provider, will assess and evaluate patient's response and provide education to patient for prescribed medication. RN will report any adverse and/or side effects to prescribing provider.  Therapeutic Interventions: 1 on 1 counseling sessions, Psychoeducation, Medication administration, Evaluate responses to treatment, Monitor vital signs and CBGs as ordered, Perform/monitor CIWA, COWS, AIMS and Fall Risk screenings as ordered, Perform wound care treatments as ordered.  Evaluation of Outcomes: Progressing   LCSW Treatment Plan  for Primary Diagnosis: Bipolar 1 disorder, mixed, severe (HCC) Long Term Goal(s): Safe transition to appropriate next level of care at discharge, Engage patient in therapeutic group addressing interpersonal concerns.  Short Term Goals: Engage patient in aftercare planning with referrals and resources, Increase social support, Increase emotional regulation, Identify triggers associated with mental health/substance abuse issues and Increase skills for wellness and recovery  Therapeutic Interventions: Assess for all discharge needs, 1 to 1 time with Social worker, Explore available resources and support systems, Assess for adequacy in community support network, Educate family and significant other(s) on suicide prevention, Complete Psychosocial Assessment, Interpersonal group therapy.  Evaluation of Outcomes: Progressing   Progress in Treatment: Attending groups: Yes. Participating in groups: Yes. Taking medication as prescribed: Yes. Toleration medication: Yes. Family/Significant other contact made: No, will contact:  daughter and mother Patient understands diagnosis: Yes. Discussing patient identified problems/goals with staff: Yes. Medical problems stabilized or resolved: Yes. Denies suicidal/homicidal ideation: Yes. Issues/concerns per patient self-inventory: No.   New problem(s) identified: No, Describe:  None identified   New Short Term/Long Term Goal(s):  Discharge Plan or Barriers:   Reason for Continuation of Hospitalization: Depression Hallucinations  Estimated Length of Stay: 3-5 days   Attendees: Patient: Tichina Koebel  01/28/2016 11:21 AM  Physician: Dr. Kristine Linea, MD  01/28/2016 11:21 AM  Nursing: Shelia Media, RN 01/28/2016 11:21 AM  RN Care Manager: 01/28/2016 11:21 AM  Social Worker: Hampton Abbot, MSW, LCSW-A 01/28/2016 11:21 AM  Recreational Therapist: Hershal Coria, LRT, CTRS 01/28/2016 11:21 AM   Scribe for Treatment Team: Lynden Oxford,  LCSWA 01/28/2016 11:21 AM

## 2016-01-28 NOTE — Plan of Care (Signed)
Problem: Coping: Goal: Ability to cope will improve Outcome: Not Progressing Patient could not control her anger.

## 2016-01-28 NOTE — Progress Notes (Signed)
[facility] MD Progress Note  01/28/2016 3:30 PM Catherine Lester  MRN:  [id number]  Subjective:  Patient is a 50 year old female who reports to the ER by way of police after becoming agitated and acting bizarre at a local motel. Patient was a very poor historian due to thought blocking and delusion/confusion. Patient states that she came to [facility] from [place] to help her daughter. There was a dispute between the patient and her daughter and the daughter kicked her out of the house. Patient states that she went to a local motel where the employees started bullying her and not letting her sleep. She states that her daughter and her boyfriend told the employees to bully her. Patient states that she knows her family is here in the hospital admitted. She can hear them talking outside her door so she knows they are here. She also knows that there is an investigation going on. Patient is very agitated and states that she does not need to be here. She wants to go out and smoke. Patient has other delusions including that her phone and motel room was bugged by the police.  11/6 the patient was extremely hostile and irritable during interaction. Continues to state that she is in the hospital because she was set up by her daughter and her daughter's boyfriend. Patient states there is absolutely nothing wrong with her. She believes that we have our own agenda for which we are ordering  a lot of psychiatric medications. Staff report the patient show less hostility and irritability yesterday. However once again today she does not appear much improved from her original admission day last week.  Continues to deny suicidality, homicidality or auditory or visual hallucinations. Patient is still very paranoid and suspicious about being set up by family members.  Monday night Initially refused Invega last night after the dose was increased to 12 mg. However eventually she took the medication. Tuesday night refused to take the full dose  of Invega (12 mg, only took 6 mg. Nurses stated she was very agitated last night cussing and yelling.  11/8 patient was seen during treatment team meeting. She was very hostile, very tense, argumentative. Continues to stay there is nothing wrong with her mind it is her body. She says her body is hurting all over nobody is trying to figure out what's going on. The patient has been refusing full dose of Invega because she says she does not need it. Says we all are trying to insult her. It was discussed with the patient that if she continues to refuse full dose of Invega we will restart forcing medications. The patient was initially taking 6 mg of Invega at admission she's been on the same dose since Wednesday of last week with no improvement.  11/9 patient was again hostile, argumentative, cussing, constantly using profanity during assessment today. I do not feel any improvement from admission. Dose of Hinda Glatter has been increased to 12 mg but patient continues to state that she does not want to take the dose she complains of having side effects such as floaters in her eyes. She was reminded that he she is noncompliant with the full dose I will recommend forced medications.  There no evidence of major side effects. Patient is "about taking medications, she feels she doesn't need them, she feels that absolutely no reason for her to be in the hospital. She lives him keeping her here just because I'm listening to her daughter and not to her. She does  not understand but they have concerns with psychosis and significant mood instability. Patient believes her daughter has checked into the unit and is now a patient.  Pt participated in 2 groups yesterday  11/10 The patient is argumentative and loud. She has been "pissed off all day". She believes that I am her assigned psychiatrist and that we have been meeting daily. Unable to reason. Unwilling to comply with program.  Per nursing:  11/5 she was telling the nurses  that she was able to hear her daughter who she thought was here in the unit.  11/06 Pt called 911.   11/07 Hostile, livid, suspicious, paranoid, blaming others, needy, demanding, unpleasant, vocabulary rich in vulgarity and profanity. Minimal interaction with peers, refused and only took 2 tab or 6 mg of Invega, "I told the f**king Dr or whoever the freaking lady that I came back to get the other 2 last night because of commotion going on .Marland Kitchen...so I am not taking it..." Requested for  Ativan 0.5 mg and Trazodone 100 mg with bedtime medications.  11/08 Patient continues to be angry and wants to be discharged "right now!, there is nothing wrong with my head, I know what's going on here, liar, liars". Accusing staffs of plotting against her to keep her here; still paranoid, limited interactions with others. PRNs (Trazodone 100 mg (sleep), Ativan 0.5 mg (Anxiety) and Ibuprofen 400 mg (5/10 moderate, generalized pain). Will continue to maintain safety.   Principal Problem: Bipolar 1 disorder, mixed, severe (HCC) Diagnosis:   Patient Active Problem List   Diagnosis Date Noted  . Alcohol use disorder, severe, dependence (HCC) [F10.20] 01/20/2016  . Alcohol withdrawal (HCC) [F10.239] 01/20/2016  . Cannabis use disorder, mild, abuse [F12.10] 01/20/2016  . Tobacco use disorder [F17.200] 01/20/2016  . HTN (hypertension) [I10] 01/20/2016  . Hypothyroidism [E03.9] 01/20/2016  . Opioid use disorder, moderate, dependence (HCC) [F11.20] 01/20/2016  . Bipolar 1 disorder, mixed, severe (HCC) [F31.63] 01/19/2016   Total Time spent with patient: 30 minutes  Past Psychiatric History: Patient denies past psychiatric history. Patient's mother states that patient has been diagnosed with anxiety. She denies any previous hospitalizations due to mental health reasons.  Past Medical History:  Past Medical History:  Diagnosis Date  . Anxiety   . Hx of heart valve insufficiency   . Hypertension   . Thyroid disease    . Urinary incontinence     Past Surgical History:  Procedure Laterality Date  . ANTERIOR FUSION CERVICAL SPINE     C5, C6  . BACK SURGERY    . TONSILLECTOMY    . TUBAL LIGATION     Family History: History reviewed. No pertinent family history.   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 Patient's mother does not know why she was fired. Patient has a daughter in [place] and a son. She is currently living with her parents and aunt.  Denies legal trouble.     History  Alcohol Use  . Yes    Comment: 3-4 times weekly      History  Drug Use  . Types: Marijuana    Social History   Social History  . Marital status: Single    Spouse name: N/A  . Number of children: N/A  . Years of education: N/A   Social History Main Topics  . Smoking status: Current Every Day Smoker  Packs/day: 1.50    Types: Cigarettes  . Smokeless tobacco: Never Used  . Alcohol use Yes     Comment: 3-4 times weekly   . Drug use:     Types: Marijuana  . Sexual activity: No   Other Topics Concern  . None   Social History Narrative  . None   Additional Social History:    History of alcohol / drug use?: Yes Negative Consequences of Use: Financial, Personal relationships, Work / Programmer, multimedia     Current Medications: Current Facility-Administered Medications  Medication Dose Route Frequency Provider Last Rate Last Dose  . acetaminophen (TYLENOL) tablet 650 mg  650 mg Oral Q6H PRN Audery Amel, MD      . alum & mag hydroxide-simeth (MAALOX/MYLANTA) 200-200-20 MG/5ML suspension 30 mL  30 mL Oral Q4H PRN Audery Amel, MD   30 mL at 01/28/16 1107  . aspirin EC tablet 81 mg  81 mg Oral Daily Audery Amel, MD   81 mg at 01/28/16 0854  . diphenhydrAMINE (BENADRYL) capsule 25 mg  25 mg Oral Q8H PRN Jimmy Footman, MD      . ibuprofen (ADVIL,MOTRIN) tablet  400 mg  400 mg Oral Q6H PRN Audery Amel, MD   400 mg at 01/28/16 0854  . levothyroxine (SYNTHROID, LEVOTHROID) tablet 125 mcg  125 mcg Oral QAC breakfast Jimmy Footman, MD   125 mcg at 01/28/16 0641  . lidocaine (LIDODERM) 5 % 1 patch  1 patch Transdermal Q24H Audery Amel, MD   1 patch at 01/26/16 1237  . lisinopril (PRINIVIL,ZESTRIL) tablet 20 mg  20 mg Oral Daily Jimmy Footman, MD   20 mg at 01/28/16 0854  . LORazepam (ATIVAN) tablet 0.5 mg  0.5 mg Oral TID PRN Jimmy Footman, MD   0.5 mg at 01/26/16 2021  . LORazepam (ATIVAN) tablet 2 mg  2 mg Oral Q4H PRN Audery Amel, MD   2 mg at 01/28/16 1610  . magnesium hydroxide (MILK OF MAGNESIA) suspension 30 mL  30 mL Oral Daily PRN Audery Amel, MD      . nicotine (NICODERM CQ - dosed in mg/24 hours) patch 21 mg  21 mg Transdermal Daily Jimmy Footman, MD   21 mg at 01/28/16 0854  . paliperidone (INVEGA) 24 hr tablet 12 mg  12 mg Oral Daily Jimmy Footman, MD   12 mg at 01/28/16 0854  . simvastatin (ZOCOR) tablet 20 mg  20 mg Oral q1800 Audery Amel, MD   20 mg at 01/27/16 1658  . traZODone (DESYREL) tablet 100 mg  100 mg Oral QHS PRN Audery Amel, MD   100 mg at 01/27/16 2136  . vitamin B-12 (CYANOCOBALAMIN) tablet 1,000 mcg  1,000 mcg Oral Daily Jimmy Footman, MD   1,000 mcg at 01/28/16 9604    Lab Results:  No results found for this or any previous visit (from the past 48 hour(s)).  Blood Alcohol level:  Lab Results  Component Value Date   ETH <5 01/19/2016    Metabolic Disorder Labs: Lab Results  Component Value Date   HGBA1C 5.4 01/20/2016   MPG 108 01/20/2016   Lab Results  Component Value Date   PROLACTIN 54.9 (H) 01/20/2016   Lab Results  Component Value Date   CHOL 139 01/20/2016   TRIG 341 (H) 01/20/2016   HDL 32 (L) 01/20/2016   CHOLHDL 4.3 01/20/2016   VLDL 68 (H) 01/20/2016   LDLCALC 39 01/20/2016    Physical  Findings: AIMS: Facial  and Oral Movements Muscles of Facial Expression: None, normal Lips and Perioral Area: None, normal Jaw: None, normal Tongue: None, normal,Extremity Movements Upper (arms, wrists, hands, fingers): None, normal Lower (legs, knees, ankles, toes): None, normal, Trunk Movements Neck, shoulders, hips: None, normal, Overall Severity Severity of abnormal movements (highest score from questions above): None, normal Incapacitation due to abnormal movements: None, normal Patient's awareness of abnormal movements (rate only patient's report): No Awareness, Dental Status Current problems with teeth and/or dentures?: No Does patient usually wear dentures?: No  CIWA:  CIWA-Ar Total: 1 COWS:     Musculoskeletal: Strength & Muscle Tone: within normal limits Gait & Station: normal Patient leans: N/A  Psychiatric Specialty Exam: Physical Exam  Nursing note and vitals reviewed. Constitutional: She appears well-developed and well-nourished.  HENT:  Head: Normocephalic and atraumatic.  Eyes: Conjunctivae are normal. Pupils are equal, round, and reactive to Eidson.  Neck: Normal range of motion.  Cardiovascular: Normal heart sounds.   Respiratory: Effort normal. No respiratory distress.  GI: Soft.  Musculoskeletal: Normal range of motion.  Neurological: She is alert.  Skin: Skin is warm and dry.    Review of Systems  Constitutional: Negative.  Negative for malaise/fatigue.  HENT: Negative.   Eyes: Negative.   Respiratory: Negative.   Cardiovascular: Negative.   Gastrointestinal: Negative.   Genitourinary: Negative.   Musculoskeletal: Positive for back pain.  Skin: Negative.   Neurological: Negative.   Psychiatric/Behavioral: Positive for substance abuse. Negative for depression, hallucinations, memory loss and suicidal ideas. The patient has insomnia. The patient is not nervous/anxious.   All other systems reviewed and are negative.   Blood pressure (!) 103/57, pulse 84, temperature 98.1 F  (36.7 C), temperature source Oral, resp. rate 18, height 5\' 2"  (1.575 m), weight 83 kg (182 lb 15.7 oz), SpO2 100 %.Body mass index is 33.47 kg/m.  General Appearance: Casual  Eye Contact:  Fair  Speech:  Clear and Coherent  Volume:  Normal  Mood:  Depressed  Affect:  Congruent  Thought Process:  Goal Directed  Orientation:  Full (Time, Place, and Person)  Thought Content:  Logical  Suicidal Thoughts:  No  Homicidal Thoughts:  No  Memory:  Immediate;   Good Recent;   Fair Remote;   Fair  Judgement:  Fair  Insight:  Lacking  Psychomotor Activity:  Normal  Concentration:  Concentration: Poor and Attention Span: Poor  Recall:  Poor  Fund of Knowledge:  Fair  Language:  Fair  Akathisia:  No  Handed:  Right  AIMS (if indicated):     Assets:  Communication Skills Desire for Improvement Physical Health Resilience  ADL's:  Intact  Cognition:  WNL  Sleep:  Number of Hours: 8     Treatment Plan Summary: Daily contact with patient to assess and evaluate symptoms and progress in treatment and Medication management   No improvement since admission  [place] [employer] [place], patient had come down to "help" her daughter who lives here and to give the daughter her car. The daughter kicked the patient out who went to stay in a motel. Police were called by the motel staff due to issues between the patient and their staff, the patient going into areas where she didn't belong and for "hearing voices."  R/o Bipolar disorder: Unclear diagnosis at this time. Patient does not have a prior psychiatric history has never received inpatient or outpatient treatment. Prior to admission with thoughts patient was taking Paxil. Per collateral information obtained  from the daughter  the symptoms have been present for at least 30 days (since she came to [facility])  -Bipolar d/o: Patient continues to display significant hostility, and severe irritability. Continues to believe she was set up by her daughter  and her daughter's boyfriend. Very hostile during assessment today. Invega  Increased to 12 mg qhs on 11/6.    No improvement noted in patient's condition. Patient took 12 mg of Invega on Monday, Tuesday she refused the full dose and only took 6 mg.  Wednesday pt was told that if she didn't complied with full dose of invega we were going to order non emergency forced medications.  Pt finally took full dose  -Irritability and agitation: Continue with Ativan 0.5 mg 3 times a day as needed.  -Alcohol use disorder: Patient's daughter report patient drinks heavily.  In need of substance abuse treatment upon discharge  - Alcohol withdrawal: resolved  -Hypertension:  continued with lisinopril 20 mg by mouth daily  -Dyslipidemia: continued with zocor daily  -Cardiovascular disease: Continue aspirin daily  -hypothyroid- Patient continued on levothyroxine  -History of chronic pain: Patient is currently receiving a Lidoderm patch apply to lower back, she has also order is for ibuprofen when necessary. She has past history of abusing narcotics.  -UTI: treated with fosfomycin  -B12 deficiency: continue B12 oral  -Tobacco use disorder: nicotine patch 21 mg  Precautions every 15 minute checks  Vital signs daily  Diet low sodium  Hospitalization status involuntary commitment--IVC hearing was held on 11/7  Labs: HIV -, RPR -, B12 low and ammonia level wnl.  HbA1c and lipid panel have been completed  Collateral information has been obtained from the patient's daughter and the patient's mother. See notes above. Spoke with pt's mother again on 11/8. Pt's mother doesn't understand current condition.  She is requesting for the pt to be discharge as pt's brother in law dies and pt's uncle is in hospice.  Imaging:  MRI of the brain no acute process  Possible d/c in about 7 days.   01/28/2016. No medication changes were offered.   Kristine Linea, MD 01/28/2016, 3:30 PM

## 2016-01-28 NOTE — [facility] Group Notes (Addendum)
[facility] Group Notes:  (Nursing/MHT/Case Management/Adjunct)  Date:  01/28/2016  Time:  3:42 AM  Type of Therapy:  Group Therapy  Participation Level:  Minimal  Participation Quality:  Resistant  Affect:  Angry and Defensive  Cognitive:  Disorganized  Insight:  Lacking  Engagement in Group:  Lacking and Resistant  Modes of Intervention:  Discussion  Summary of Progress/Problems: When staff asked pt about her goal. Pt stated "I want to go home, don't ask me anything else." Staff attempted to redirect pt to talk about her goals. Pt stated "I don't belong here with you idiots(staff)." Staff made pt aware that she had the option of going to her room if she was going to continue insulting staff. Pt stated that she was fine and continued to sit in group.   Catherine Lester 01/28/2016, 3:42 AM

## 2016-01-28 NOTE — Progress Notes (Signed)
Recreation Therapy Notes  Date: 11.10.17 Time: 9:30 am Location: Craft Room  Group Topic: Coping Skills  Goal Area(s) Addresses:  Patient will participate in healthy coping skill. Patient will verbalize benefit of using art as a coping skill.  Behavioral Response: Did not attend  Intervention: Coloring  Activity: Patients were given coloring sheets to color and were instructed to think about what emotions they were feeling as well as what their minds were focused on.  Education: LRT educated patients on healthy coping skills.  Education Outcome: Patient did not attend group.  Clinical Observations/Feedback: Patient did not attend group.   Aleem Elza M, LRT/CTRS 01/28/2016 10:21 AM 

## 2016-01-28 NOTE — Progress Notes (Signed)
1940: Patient in room. Upset, angry, paranoid and suspicious. 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 Removed her lidocaine patch reporting that it makes her itchy "I might be allergic to it...".  Patient was encouraged to discuss her situation with MD in the morning. Emotional support provided. Safety precautions reinforced.

## 2016-01-29 DIAGNOSIS — F3163 Bipolar disorder, current episode mixed, severe, without psychotic features: Secondary | ICD-10-CM

## 2016-01-29 DIAGNOSIS — Z79899 Other long term (current) drug therapy: Secondary | ICD-10-CM

## 2016-01-29 DIAGNOSIS — F17210 Nicotine dependence, cigarettes, uncomplicated: Secondary | ICD-10-CM

## 2016-01-29 NOTE — Progress Notes (Signed)
[facility]  01/29/2016 1:52 PM Isaiah SergeChristine Bellucci  MRN:  [id number]  Subjective:  Patient is a 50 year old female who reports to the ER by way of police after becoming agitated and acting bizarre at a local motel. Patient was a very poor historian due to thought blocking and delusion/confusion. Patient states that [facility] [place] to help her daughter. There was a dispute between the patient and her daughter and the daughter kicked her out of the house. Patient states that she went to a local motel where the employees started bullying her and not letting her sleep. She states that her daughter and her boyfriend told the employees to bully her. Patient states that she knows her family is here in the hospital admitted. She can hear them talking outside her door so she knows they are here. She also knows that there is an investigation going on. Patient is very agitated and states that she does not need to be here. She wants to go out and smoke. Patient has other delusions including that her phone and motel room was bugged by the police.  11/6 the patient was extremely hostile and irritable during interaction. Continues to state that she is in the hospital because she was set up by her daughter and her daughter's boyfriend. Patient states there is absolutely nothing wrong with her. She believes that we have our own agenda for which we are ordering  a lot of psychiatric medications. Staff report the patient show less hostility and irritability yesterday. However once again today she does not appear much improved from her original admission day last week.  Continues to deny suicidality, homicidality or auditory or visual hallucinations. Patient is still very paranoid and suspicious about being set up by family members.  Monday night Initially refused Invega last night after the dose was increased to 12 mg. However eventually she took the medication. Tuesday night refused to take the full dose  of Invega (12 mg, only took 6 mg. Nurses stated she was very agitated last night cussing and yelling.  11/8 patient was seen during treatment team meeting. She was very hostile, very tense, argumentative. Continues to stay there is nothing wrong with her mind it is her body. She says her body is hurting all over nobody is trying to figure out what's going on. The patient has been refusing full dose of Invega because she says she does not need it. Says we all are trying to insult her. It was discussed with the patient that if she continues to refuse full dose of Invega we will restart forcing medications. The patient was initially taking 6 mg of Invega at admission she's been on the same dose since Wednesday of last week with no improvement.  11/9 patient was again hostile, argumentative, cussing, constantly using profanity during assessment today. I do not feel any improvement from admission. Dose of Hinda Glatternvega has been increased to 12 mg but patient continues to state that she does not want to take the dose she complains of having side effects such as floaters in her eyes. She was reminded that he she is noncompliant with the full dose I will recommend forced medications.  There no evidence of major side effects. Patient is "about taking medications, she feels she doesn't need them, she feels that absolutely no reason for her to be in the hospital. She lives him keeping her here just because I'm listening to her daughter and not to her. She does  not understand but they have concerns with psychosis and significant mood instability. Patient believes her daughter has checked into the unit and is now a patient.  Pt participated in 2 groups yesterday  11/10 The patient is argumentative and loud. She has been "pissed off all day". She believes that I am her assigned psychiatrist and that we have been meeting daily. Unable to reason. Unwilling to comply with program.  Per nursing:  11/5 she was telling the nurses  that she was able to hear her daughter who she thought was here in the unit.  11/06 Pt called 911.   11/07 Hostile, livid, suspicious, paranoid, blaming others, needy, demanding, unpleasant, vocabulary rich in vulgarity and profanity. Minimal interaction with peers, refused and only took 2 tab or 6 mg of Invega, "I told the f**king Dr or whoever the freaking lady that I came back to get the other 2 last night because of commotion going on .Marland Kitchen..so I am not taking it..." Requested for  Ativan 0.5 mg and Trazodone 100 mg with bedtime medications.  11/08 Patient continues to be angry and wants to be discharged "right now!, there is nothing wrong with my head, I know what's going on here, liar, liars". Accusing staffs of plotting against her to keep her here; still paranoid, limited interactions with others. PRNs (Trazodone 100 mg (sleep), Ativan 0.5 mg (Anxiety) and Ibuprofen 400 mg (5/10 moderate, generalized pain). Will continue to maintain safety.  11/11 patient was seen and responded appropriately to this clinician. States that she has been doing well. However per staff she continues to be very irritable and delusional. Continues to be very paranoid.   Principal Problem: Bipolar 1 disorder, mixed, severe (HCC) Diagnosis:   Patient Active Problem List   Diagnosis Date Noted  . Alcohol use disorder, severe, dependence (HCC) [F10.20] 01/20/2016  . Alcohol withdrawal (HCC) [F10.239] 01/20/2016  . Cannabis use disorder, mild, abuse [F12.10] 01/20/2016  . Tobacco use disorder [F17.200] 01/20/2016  . HTN (hypertension) [I10] 01/20/2016  . Hypothyroidism [E03.9] 01/20/2016  . Opioid use disorder, moderate, dependence (HCC) [F11.20] 01/20/2016  . Bipolar 1 disorder, mixed, severe (HCC) [F31.63] 01/19/2016   Total Time spent with patient: 30 minutes  Past Psychiatric History: Patient denies past psychiatric history. Patient's mother states that patient has been diagnosed with anxiety. She denies any  previous hospitalizations due to mental health reasons.  Past Medical History:  Past Medical History:  Diagnosis Date  . Anxiety   . Hx of heart valve insufficiency   . Hypertension   . Thyroid disease   . Urinary incontinence     Past Surgical History:  Procedure Laterality Date  . ANTERIOR FUSION CERVICAL SPINE     C5, C6  . BACK SURGERY    . TONSILLECTOMY    . TUBAL LIGATION     Family History: History reviewed. No pertinent family history.   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 Patient's mother does not know why she was fired. Patient has a daughter in [place] and a son. She is currently living with her parents and aunt.  Denies legal trouble.     History  Alcohol Use  . Yes    Comment: 3-4 times weekly      History  Drug Use  . Types: Marijuana    Social History   Social History  . Marital  status: Single    Spouse name: N/A  . Number of children: N/A  . Years of education: N/A   Social History Main Topics  . Smoking status: Current Every Day Smoker    Packs/day: 1.50    Types: Cigarettes  . Smokeless tobacco: Never Used  . Alcohol use Yes     Comment: 3-4 times weekly   . Drug use:     Types: Marijuana  . Sexual activity: No   Other Topics Concern  . None   Social History Narrative  . None   Additional Social History:    History of alcohol / drug use?: Yes Negative Consequences of Use: Financial, Personal relationships, Work / Programmer, multimediachool     Current Medications: Current Facility-Administered Medications  Medication Dose Route Frequency Provider Last Rate Last Dose  . acetaminophen (TYLENOL) tablet 650 mg  650 mg Oral Q6H PRN Audery AmelJohn T Clapacs, MD      . alum & mag hydroxide-simeth (MAALOX/MYLANTA) 200-200-20 MG/5ML suspension 30 mL  30 mL Oral Q4H PRN Audery AmelJohn T Clapacs, MD   30 mL at 01/29/16 1047  . aspirin EC  tablet 81 mg  81 mg Oral Daily Audery AmelJohn T Clapacs, MD   81 mg at 01/29/16 0802  . diphenhydrAMINE (BENADRYL) capsule 25 mg  25 mg Oral Q8H PRN Jimmy FootmanAndrea Hernandez-Gonzalez, MD   25 mg at 01/29/16 0800  . ibuprofen (ADVIL,MOTRIN) tablet 400 mg  400 mg Oral Q6H PRN Audery AmelJohn T Clapacs, MD   400 mg at 01/29/16 0648  . levothyroxine (SYNTHROID, LEVOTHROID) tablet 125 mcg  125 mcg Oral QAC breakfast Jimmy FootmanAndrea Hernandez-Gonzalez, MD   125 mcg at 01/29/16 [phone]  . lidocaine (LIDODERM) 5 % 1 patch  1 patch Transdermal Q24H Audery AmelJohn T Clapacs, MD   1 patch at 01/28/16 1729  . lisinopril (PRINIVIL,ZESTRIL) tablet 20 mg  20 mg Oral Daily Jimmy FootmanAndrea Hernandez-Gonzalez, MD   20 mg at 01/29/16 0800  . LORazepam (ATIVAN) tablet 0.5 mg  0.5 mg Oral TID PRN Jimmy FootmanAndrea Hernandez-Gonzalez, MD   0.5 mg at 01/28/16 1731  . LORazepam (ATIVAN) tablet 2 mg  2 mg Oral Q4H PRN Audery AmelJohn T Clapacs, MD   2 mg at 01/29/16 1047  . magnesium hydroxide (MILK OF MAGNESIA) suspension 30 mL  30 mL Oral Daily PRN Audery AmelJohn T Clapacs, MD      . nicotine (NICODERM CQ - dosed in mg/24 hours) patch 21 mg  21 mg Transdermal Daily Jimmy FootmanAndrea Hernandez-Gonzalez, MD   21 mg at 01/29/16 0804  . paliperidone (INVEGA) 24 hr tablet 12 mg  12 mg Oral Daily Jimmy FootmanAndrea Hernandez-Gonzalez, MD   12 mg at 01/29/16 0802  . simvastatin (ZOCOR) tablet 20 mg  20 mg Oral q1800 Audery AmelJohn T Clapacs, MD   20 mg at 01/28/16 1729  . traZODone (DESYREL) tablet 100 mg  100 mg Oral QHS PRN Audery AmelJohn T Clapacs, MD   100 mg at 01/28/16 2111  . vitamin B-12 (CYANOCOBALAMIN) tablet 1,000 mcg  1,000 mcg Oral Daily Jimmy FootmanAndrea Hernandez-Gonzalez, MD   1,000 mcg at 01/29/16 0800    Lab Results:  No results found for this or any previous visit (from the past 48 hour(s)).  Blood Alcohol level:  Lab Results  Component Value Date   ETH <5 01/19/2016    Metabolic Disorder Labs: Lab Results  Component Value Date   HGBA1C 5.4 01/20/2016   MPG 108 01/20/2016   Lab Results  Component Value Date   PROLACTIN 54.9 (H)  01/20/2016   Lab Results  Component Value Date   CHOL 139 01/20/2016   TRIG 341 (H) 01/20/2016   HDL 32 (L) 01/20/2016   CHOLHDL 4.3 01/20/2016   VLDL 68 (H) 01/20/2016   LDLCALC 39 01/20/2016    Physical Findings: AIMS: Facial and Oral Movements Muscles of Facial Expression: None, normal Lips and Perioral Area: None, normal Jaw: None, normal Tongue: None, normal,Extremity Movements Upper (arms, wrists, hands, fingers): None, normal Lower (legs, knees, ankles, toes): None, normal, Trunk Movements Neck, shoulders, hips: None, normal, Overall Severity Severity of abnormal movements (highest score from questions above): None, normal Incapacitation due to abnormal movements: None, normal Patient's awareness of abnormal movements (rate only patient's report): No Awareness, Dental Status Current problems with teeth and/or dentures?: No Does patient usually wear dentures?: No  CIWA:  CIWA-Ar Total: 1 COWS:     Musculoskeletal: Strength & Muscle Tone: within normal limits Gait & Station: normal Patient leans: N/A  Psychiatric Specialty Exam: Physical Exam  Nursing note and vitals reviewed. Constitutional: She appears well-developed and well-nourished.  HENT:  Head: Normocephalic and atraumatic.  Eyes: Conjunctivae are normal. Pupils are equal, round, and reactive to Dena.  Neck: Normal range of motion.  Cardiovascular: Normal heart sounds.   Respiratory: Effort normal. No respiratory distress.  GI: Soft.  Musculoskeletal: Normal range of motion.  Neurological: She is alert.  Skin: Skin is warm and dry.    Review of Systems  Constitutional: Negative.  Negative for malaise/fatigue.  HENT: Negative.   Eyes: Negative.   Respiratory: Negative.   Cardiovascular: Negative.   Gastrointestinal: Negative.   Genitourinary: Negative.   Musculoskeletal: Positive for back pain.  Skin: Negative.   Neurological: Negative.   Psychiatric/Behavioral: Positive for substance abuse.  Negative for depression, hallucinations, memory loss and suicidal ideas. The patient has insomnia. The patient is not nervous/anxious.   All other systems reviewed and are negative.   Blood pressure 118/73, pulse 82, temperature 98.4 F (36.9 C), resp. rate 18, height 5\' 2"  (1.575 m), weight 182 lb 15.7 oz (83 kg), SpO2 100 %.Body mass index is 33.47 kg/m.  General Appearance: Casual  Eye Contact:  Fair  Speech:  Clear and Coherent  Volume:  Normal  Mood:  Depressed  Affect:  Congruent  Thought Process:  Goal Directed  Orientation:  Full (Time, Place, and Person)  Thought Content:  Logical  Suicidal Thoughts:  No  Homicidal Thoughts:  No  Memory:  Immediate;   Good Recent;   Fair Remote;   Fair  Judgement:  Fair  Insight:  Lacking  Psychomotor Activity:  Normal  Concentration:  Concentration: Poor and Attention Span: Poor  Recall:  Poor  Fund of Knowledge:  Fair  Language:  Fair  Akathisia:  No  Handed:  Right  AIMS (if indicated):     Assets:  Communication Skills Desire for Improvement Physical Health Resilience  ADL's:  Intact  Cognition:  WNL  Sleep:  Number of Hours: 7.15     Treatment Plan Summary: Daily contact with patient to assess and evaluate symptoms and progress in treatment and Medication management   No improvement since admission  Per [employer] report, patient had come down to "help" her daughter who lives here and to give the daughter her car. The daughter kicked the patient out who went to stay in a motel. Police were called by the motel staff due to issues between the patient and their staff, the patient going into areas where she didn't belong and for "hearing voices."  R/o  Bipolar disorder: Unclear diagnosis at this time. Patient does not have a prior psychiatric history has never received inpatient or outpatient treatment. Prior to admission with thoughts patient was taking Paxil. Per collateral information obtained from the daughter  the  symptoms have been present for at least 30 days (since she came to [facility])  -Bipolar d/o: Patient continues to display significant hostility, and severe irritability. Continues to believe she was set up by her daughter and her daughter's boyfriend. Very hostile during assessment today. Invega  Increased to 12 mg qhs on 11/6.    No improvement noted in patient's condition. Patient took 12 mg of Invega on Monday, Tuesday she refused the full dose and only took 6 mg.  Wednesday pt was told that if she didn't complied with full dose of invega we were going to order non emergency forced medications.  Pt finally took full dose  -Irritability and agitation: Continue with Ativan 0.5 mg 3 times a day as needed.  -Alcohol use disorder: Patient's daughter report patient drinks heavily.  In need of substance abuse treatment upon discharge  - Alcohol withdrawal: resolved  -Hypertension:  continued with lisinopril 20 mg by mouth daily  -Dyslipidemia: continued with zocor daily  -Cardiovascular disease: Continue aspirin daily  -hypothyroid- Patient continued on levothyroxine  -History of chronic pain: Patient is currently receiving a Lidoderm patch apply to lower back, she has also order is for ibuprofen when necessary. She has past history of abusing narcotics.  -UTI: treated with fosfomycin  -B12 deficiency: continue B12 oral  -Tobacco use disorder: nicotine patch 21 mg  Precautions every 15 minute checks  Vital signs daily  Diet low sodium  Hospitalization status involuntary commitment--IVC hearing was held on 11/7  Labs: HIV -, RPR -, B12 low and ammonia level wnl.  HbA1c and lipid panel have been completed  Collateral information has been obtained from the patient's daughter and the patient's mother. See notes above. Spoke with pt's mother again on 11/8. Pt's mother doesn't understand current condition.  She is requesting for the pt to be discharge as pt's brother in law  dies and pt's uncle is in hospice.  Imaging:  MRI of the brain no acute process  Possible d/c in about 7 days.   01/29/2016. No medication changes were offered.   Patrick North, MD 01/29/2016, 1:52 PM

## 2016-01-29 NOTE — Progress Notes (Signed)
Patient visible in the milieu. Continues to display paranoic behavior, guarded and irritable. States that she was supposed to be discharged but "some staff got involved" and she was not discharged.  Discharge information was discussed. Patient was encouraged to discuss discharge planning with MD in AM. Was encouraged to continue to express her feelings and concerns as needed. Emotional support offered.  Patient's safety maintained.

## 2016-01-29 NOTE — Progress Notes (Signed)
Patient has been quite pleasant today. Was a little irritable this morning. Complained of feeling overwhelmed about discharge and going to court. Has been preoccupied and delusional about discharge stating multiple staff members have told her that she was leaving today. Informed her that there is no order for her to discharge and the MD would let her know when this would happen. Complained of headache and was given IBU before the shift. States anxiety was a 7/10 and was given prn Ativan 2mg . Also complained of  generalized itching and was given prn benadryl twice on day shift. Goal for the day was to discharge. No SI/HI or AVH. AxO x4. Remains on Q15 minutes checks for safety. Will continue to monitor.

## 2016-01-29 NOTE — [facility] Group Notes (Signed)
[facility] LCSW Group Therapy  01/29/2016 2:53 PM  Type of Therapy:  Group Therapy  Participation Level:  Patient did not attend group. CSW invited patient to group.   Summary of Progress/Problems:Self esteem: Patients discussed self esteem and how it impacts them. They discussed what aspects in their lives has influenced their self esteem. They were challenged to identify changes that are needed in order to improve self esteem. Patients participated in activity where they had to identify positive adjectives they felt described their personality. Patients shared with the group on the following areas: Things I am good at, What I like about my appearance, I've helped others by, What I value the most, compliments I have received, challenges I have overcome, thing that make me unique, and Times I've made others happy.    Kaprice Kage G. Garnette CzechSampson MSW, LCSWA 01/29/2016, 2:53 PM

## 2016-01-29 NOTE — Progress Notes (Signed)
Patient was in the milieu, presented to the dayroom and asked for Trazodone and said she would come back if it does not make her sleep. Went to bed but remained agitated, guarded, paranoid and angry. Requested Ativan 2mg  as prescribed. Patient is now sleeping. Staff continue to monitor.

## 2016-01-29 NOTE — Plan of Care (Signed)
Problem: Coping: Goal: Ability to verbalize feelings will improve Outcome: Progressing Patient able to verbalize when she is feeling overwhelmed and anxious. Also requested prn medication   Problem: Health Behavior/Discharge Planning: Goal: Compliance with prescribed medication regimen will improve Outcome: Progressing Pain has been medication compliant

## 2016-01-29 NOTE — Plan of Care (Signed)
Problem: Safety: Goal: Ability to remain free from injury will improve Outcome: Progressing Denies suicidal/homicidal thoughts   

## 2016-01-29 NOTE — Plan of Care (Signed)
Problem: Coping: Goal: Ability to verbalize frustrations and anger appropriately will improve Outcome: Not Progressing Patient remains irritable and angry, guarded and blaming

## 2016-01-29 NOTE — Plan of Care (Signed)
Problem: Education: Goal: Will be free of psychotic symptoms Outcome: Not Progressing Remains paranoid, suspicious, denial and blaming others.

## 2016-01-29 NOTE — Plan of Care (Signed)
Problem: Health Behavior/Discharge Planning: Goal: Compliance with prescribed medication regimen will improve Outcome: Progressing Taking medications as prescribed   

## 2016-01-29 NOTE — Plan of Care (Signed)
Problem: Health Behavior/Discharge Planning: Goal: Ability to manage health-related needs will improve Outcome: Progressing Patient remains denial, blaming others. "I do not need to be here "

## 2016-01-30 NOTE — Progress Notes (Signed)
Pt continues to be angry, irritable, extremely vulgar with language and approach. Is not noted to interact with other peers. Continues to blame everyone else, especially staff for working against her. Refuses Lidoderm as ordered. Complains of generalized itching, anxiety, and requests PRN medications constantly. Medication complaint other than refusing Lidoderm.   Support and encouragement provided with use of therapeutic communication. Medications administered as ordered. Safety maintained with every 15 minute checks. Will continue to monitor.

## 2016-01-30 NOTE — Plan of Care (Signed)
Problem: Coping: Goal: Ability to cope will improve Outcome: Not Progressing Patient has poor coping skills. Communication remains aggressive, threatening.

## 2016-01-30 NOTE — [facility] Group Notes (Signed)
[facility] LCSW Group Therapy  01/30/2016 2:40 PM  Type of Therapy:  Group Therapy  Participation Level:  Patient did not attend group. CSW invited patient to group.   Summary of Progress/Problems:Coping Skills: Patients defined and discussed healthy coping skills. Patients identified healthy coping skills they would like to try during hospitalization and after discharge. CSW offered insight to varying coping skills that may have been new to patients such as practicing mindfulness.  Latyra Jaye G. Garnette CzechSampson MSW, LCSWA 01/30/2016, 2:40 PM

## 2016-01-30 NOTE — Progress Notes (Signed)
1940: Patient presented to the nurses station, upset, angry, paranoid, guarded, reporting that she is experiencing pain in her left upper arm "I need you to complete an incident report, that girl Dedra SkeensGwen gave me an injection and my arm has been hurting...". Continues to report that staff is complotting against her by  telling her daughter that patient should not be discharged soon. Looking at patient's ordersand MAR, there is no recent   information indicating injection administration. Patient currently in room and safety precautions reinforced.

## 2016-01-30 NOTE — Plan of Care (Signed)
Problem: Education: Goal: Will be free of psychotic symptoms Outcome: Not Progressing Paranoia increasing: patient continues to believe that nurses are calling her daughter to tell her that she should not be discharged. Suspicious, saying that nurses and techs are talking behind her

## 2016-01-30 NOTE — Progress Notes (Signed)
[facility] MD Progress Note  01/30/2016 11:41 AM Catherine Lester  MRN:  [id number]  Subjective:  Patient is a 50 year old female who reports to the ER by way of police after becoming agitated and acting bizarre at a local motel. Patient was a very poor historian due to thought blocking and delusion/confusion. Patient states that she came to [facility] from [place] to help her daughter. There was a dispute between the patient and her daughter and the daughter kicked her out of the house. Patient states that she went to a local motel where the employees started bullying her and not letting her sleep. She states that her daughter and her boyfriend told the employees to bully her. Patient states that she knows her family is here in the hospital admitted. She can hear them talking outside her door so she knows they are here. She also knows that there is an investigation going on. Patient is very agitated and states that she does not need to be here. She wants to go out and smoke. Patient has other delusions including that her phone and motel room was bugged by the police.  11/12 Patient continues to be paranoid and very irritable. Yesterday she was a little pleasant in the morning. However throughout the day she continues to ask the nursing staff about her discharge. This morning she does this clinician that everybody is colluding to not let her be discharged. She tells this clinician that even if she were to hire a lawyer that he would collude with the rest of her as and not let her be discharged. She is very irritable and starts getting loud and cursing staff. However she has not been physically aggressive so far. She did take Invega 12 mg with some coaxing from the nurse today. Denies suicidal or homicidal thoughts.   Principal Problem: Bipolar 1 disorder, mixed, severe (HCC) Diagnosis:   Patient Active Problem List   Diagnosis Date Noted  . Alcohol use disorder, severe, dependence (HCC) [F10.20] 01/20/2016  .  Alcohol withdrawal (HCC) [F10.239] 01/20/2016  . Cannabis use disorder, mild, abuse [F12.10] 01/20/2016  . Tobacco use disorder [F17.200] 01/20/2016  . HTN (hypertension) [I10] 01/20/2016  . Hypothyroidism [E03.9] 01/20/2016  . Opioid use disorder, moderate, dependence (HCC) [F11.20] 01/20/2016  . Bipolar 1 disorder, mixed, severe (HCC) [F31.63] 01/19/2016   Total Time spent with patient: 30 minutes  Past Psychiatric History: Patient denies past psychiatric history. Patient's mother states that patient has been diagnosed with anxiety. She denies any previous hospitalizations due to mental health reasons.  Past Medical History:  Past Medical History:  Diagnosis Date  . Anxiety   . Hx of heart valve insufficiency   . Hypertension   . Thyroid disease   . Urinary incontinence     Past Surgical History:  Procedure Laterality Date  . ANTERIOR FUSION CERVICAL SPINE     C5, C6  . BACK SURGERY    . TONSILLECTOMY    . TUBAL LIGATION     Family History: History reviewed. No pertinent family history.   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 Patient's mother does not know why she was fired. Patient has a daughter in [place] and a son. She is currently living with her parents and aunt.  Denies legal trouble.     History  Alcohol Use  . Yes  Comment: 3-4 times weekly      History  Drug Use  . Types: Marijuana    Social History   Social History  . Marital status: Single    Spouse name: N/A  . Number of children: N/A  . Years of education: N/A   Social History Main Topics  . Smoking status: Current Every Day Smoker    Packs/day: 1.50    Types: Cigarettes  . Smokeless tobacco: Never Used  . Alcohol use Yes     Comment: 3-4 times weekly   . Drug use:     Types: Marijuana  . Sexual activity: No   Other Topics Concern  . None    Social History Narrative  . None   Additional Social History:    History of alcohol / drug use?: Yes Negative Consequences of Use: Financial, Personal relationships, Work / Programmer, multimedia     Current Medications: Current Facility-Administered Medications  Medication Dose Route Frequency Provider Last Rate Last Dose  . acetaminophen (TYLENOL) tablet 650 mg  650 mg Oral Q6H PRN Audery Amel, MD      . alum & mag hydroxide-simeth (MAALOX/MYLANTA) 200-200-20 MG/5ML suspension 30 mL  30 mL Oral Q4H PRN Audery Amel, MD   30 mL at 01/29/16 1047  . aspirin EC tablet 81 mg  81 mg Oral Daily Audery Amel, MD   81 mg at 01/30/16 1191  . diphenhydrAMINE (BENADRYL) capsule 25 mg  25 mg Oral Q8H PRN Jimmy Footman, MD   25 mg at 01/30/16 0659  . ibuprofen (ADVIL,MOTRIN) tablet 400 mg  400 mg Oral Q6H PRN Audery Amel, MD   400 mg at 01/29/16 0648  . levothyroxine (SYNTHROID, LEVOTHROID) tablet 125 mcg  125 mcg Oral QAC breakfast Jimmy Footman, MD   125 mcg at 01/30/16 [phone]  . lidocaine (LIDODERM) 5 % 1 patch  1 patch Transdermal Q24H Audery Amel, MD   1 patch at 01/28/16 1729  . lisinopril (PRINIVIL,ZESTRIL) tablet 20 mg  20 mg Oral Daily Jimmy Footman, MD   20 mg at 01/30/16 9562  . LORazepam (ATIVAN) tablet 0.5 mg  0.5 mg Oral TID PRN Jimmy Footman, MD   0.5 mg at 01/30/16 0601  . LORazepam (ATIVAN) tablet 2 mg  2 mg Oral Q4H PRN Audery Amel, MD   2 mg at 01/30/16 1308  . magnesium hydroxide (MILK OF MAGNESIA) suspension 30 mL  30 mL Oral Daily PRN Audery Amel, MD      . nicotine (NICODERM CQ - dosed in mg/24 hours) patch 21 mg  21 mg Transdermal Daily Jimmy Footman, MD   21 mg at 01/30/16 0813  . paliperidone (INVEGA) 24 hr tablet 12 mg  12 mg Oral Daily Jimmy Footman, MD   12 mg at 01/30/16 6578  . simvastatin (ZOCOR) tablet 20 mg  20 mg Oral q1800 Audery Amel, MD   20 mg at 01/28/16 1729  . traZODone (DESYREL)  tablet 100 mg  100 mg Oral QHS PRN Audery Amel, MD   100 mg at 01/29/16 2159  . vitamin B-12 (CYANOCOBALAMIN) tablet 1,000 mcg  1,000 mcg Oral Daily Jimmy Footman, MD   1,000 mcg at 01/30/16 4696    Lab Results:  No results found for this or any previous visit (from the past 48 hour(s)).  Blood Alcohol level:  Lab Results  Component Value Date   ETH <5 01/19/2016    Metabolic Disorder Labs: Lab Results  Component Value  Date   HGBA1C 5.4 01/20/2016   MPG 108 01/20/2016   Lab Results  Component Value Date   PROLACTIN 54.9 (H) 01/20/2016   Lab Results  Component Value Date   CHOL 139 01/20/2016   TRIG 341 (H) 01/20/2016   HDL 32 (L) 01/20/2016   CHOLHDL 4.3 01/20/2016   VLDL 68 (H) 01/20/2016   LDLCALC 39 01/20/2016    Physical Findings: AIMS: Facial and Oral Movements Muscles of Facial Expression: None, normal Lips and Perioral Area: None, normal Jaw: None, normal Tongue: None, normal,Extremity Movements Upper (arms, wrists, hands, fingers): None, normal Lower (legs, knees, ankles, toes): None, normal, Trunk Movements Neck, shoulders, hips: None, normal, Overall Severity Severity of abnormal movements (highest score from questions above): None, normal Incapacitation due to abnormal movements: None, normal Patient's awareness of abnormal movements (rate only patient's report): No Awareness, Dental Status Current problems with teeth and/or dentures?: No Does patient usually wear dentures?: No  CIWA:  CIWA-Ar Total: 1 COWS:     Musculoskeletal: Strength & Muscle Tone: within normal limits Gait & Station: normal Patient leans: N/A  Psychiatric Specialty Exam: Physical Exam  Nursing note and vitals reviewed. Constitutional: She appears well-developed and well-nourished.  HENT:  Head: Normocephalic and atraumatic.  Eyes: Conjunctivae are normal. Pupils are equal, round, and reactive to [place].  Neck: Normal range of motion.  Cardiovascular: Normal  heart sounds.   Respiratory: Effort normal. No respiratory distress.  GI: Soft.  Musculoskeletal: Normal range of motion.  Neurological: She is alert.  Skin: Skin is warm and dry.    Review of Systems  Constitutional: Negative.  Negative for malaise/fatigue.  HENT: Negative.   Eyes: Negative.   Respiratory: Negative.   Cardiovascular: Negative.   Gastrointestinal: Negative.   Genitourinary: Negative.   Musculoskeletal: Positive for back pain.  Skin: Negative.   Neurological: Negative.   Psychiatric/Behavioral: Positive for substance abuse. Negative for depression, hallucinations, memory loss and suicidal ideas. The patient has insomnia. The patient is not nervous/anxious.   All other systems reviewed and are negative.   Blood pressure (!) 98/57, pulse 75, temperature 98.2 F (36.8 C), temperature source Oral, resp. rate 18, height 5\' 2"  (1.575 m), weight 182 lb 15.7 oz (83 kg), SpO2 100 %.Body mass index is 33.47 kg/m.  General Appearance: Casual  Eye Contact:  Fair  Speech:  Clear and Coherent  Volume:  Normal  Mood:  Depressed, irritable   Affect:  Congruent  Thought Process:  Goal Directed  Orientation:  Full (Time, Place, and Person)  Thought Content:  Logical  Suicidal Thoughts:  No  Homicidal Thoughts:  No  Memory:  Immediate;   Good Recent;   Fair Remote;   Fair  Judgement:  Fair  Insight:  Lacking  Psychomotor Activity:  Normal  Concentration:  Concentration: Poor and Attention Span: Poor  Recall:  Poor  Fund of Knowledge:  Fair  Language:  Fair  Akathisia:  No  Handed:  Right  AIMS (if indicated):     Assets:  Communication Skills Desire for Improvement Physical Health Resilience  ADL's:  Intact  Cognition:  WNL  Sleep:  Number of Hours: 6.5     Treatment Plan Summary: Daily contact with patient to assess and evaluate symptoms and progress in treatment and Medication management   No improvement since admission  [place],  patient had come down to "help" her daughter who lives here and to give the daughter her car. The daughter kicked the patient out who  went to stay in a motel. Police were called by the motel staff due to issues between the patient and their staff, the patient going into areas where she didn't belong and for "hearing voices."  R/o Bipolar disorder: Unclear diagnosis at this time. Patient does not have a prior psychiatric history has never received inpatient or outpatient treatment. Prior to admission with thoughts patient was taking Paxil. Per collateral information obtained from the daughter  the symptoms have been present for at least 30 days ([facility]  -Bipolar d/o: Patient continues to display significant hostility, and severe irritability. Continues to believe she was set up by her daughter and her daughter's boyfriend.  Invega  Increased to 12 mg qhs on 11/6.    No improvement noted in patient's condition. Patient took 12 mg of Invega on Monday, Tuesday she refused the full dose and only took 6 mg.  Wednesday pt was told that if she didn't complied with full dose of invega we were going to order non emergency forced medications.  Pt finally took full dose  -Irritability and agitation: Continue with Ativan 0.5 mg 3 times a day as needed.  -Alcohol use disorder: Patient's daughter report patient drinks heavily.  In need of substance abuse treatment upon discharge  - Alcohol withdrawal: resolved  -Hypertension:  continued with lisinopril 20 mg by mouth daily  -Dyslipidemia: continued with zocor daily  -Cardiovascular disease: Continue aspirin daily  -hypothyroid- Patient continued on levothyroxine 125mcg  -History of chronic pain: Patient is currently receiving a Lidoderm patch apply to lower back, she has also order is for ibuprofen when necessary. She has past history of abusing narcotics.  -UTI: treated with fosfomycin  -B12 deficiency: continue B12  oral  -Tobacco use disorder: nicotine patch 21 mg  Precautions every 15 minute checks  Vital signs daily  Diet low sodium  Hospitalization status involuntary commitment--IVC hearing was held on 11/7  Labs: HIV -, RPR -, B12 low and ammonia level wnl.  HbA1c and lipid panel have been completed  Imaging:  MRI of the brain no acute process  Possible d/c in about 7 days by her primary team   01/30/2016. No medication changes were offered.   Patrick NorthAVI, Marieke Lubke, MD 01/30/2016, 11:41 AM

## 2016-01-30 NOTE — Progress Notes (Signed)
PT to this nurse asking "has the smoking policy been revised?" Informed pt that smoking policy has not changed, that pt has a nicotine patch ordered, and is not allowed to smoke in the hospital. Pt argumentative, asking, "well, can I please go outside and smoke a cigarette?" Pt redirected, but continues to be argumentative, hostile, verbally aggressive, cussing.

## 2016-01-30 NOTE — Plan of Care (Signed)
Problem: Coping: Goal: Ability to verbalize feelings will improve Outcome: Not Progressing Patient becomes angry and irritated whenever asked to talk about her feelings and concerns

## 2016-01-30 NOTE — Progress Notes (Signed)
Pt to nurse's station knocking on the door. When nurse opened the door, pt yelling, posturing, pointing her finger stating "i want it known that I know yall are telling my daughter everything that's going on here. She knows too much that is going on, and I want it known that I do not want her to know anything!" Pt did sign release of information on 11/3/207 (in paper chart) for her daughter Gean Quint(Nicole Ballengee [phone])to be contacted. Support provided. Will continue to monitor and will  inform staff of pt's desire not to share information with daughter or other family members.

## 2016-01-30 NOTE — Plan of Care (Signed)
Problem: Safety: Goal: Ability to redirect hostility and anger into socially appropriate behaviors will improve Outcome: Not Progressing Pt continues to be angry, hostile, verbally aggressive, delusional. Blames others and reports feeling as if everyone is against her. Unable to redirect pt.

## 2016-01-30 NOTE — Progress Notes (Signed)
Pt to nurse as soon as shift changed to request ativan as ordered PRN. Also states, "what time can I plan on being out of here today? Or can I at least go to court today?" Informed pt that it is Sunday, so there is not court today and that there has been no plans for her to discharge today. She responded, "well where is he?" When asked who is he, she responded "the police officer." Informed the pt that I do not know the police officer, that the officer taking her to court is not affiliated with this hospital, and today is Sunday, there's no court today. PT continues to be irritable, angry, delusional, blaming staff for "lying." It is noted that pt has also been refusing half of her dose of invega, taking only 6mg  instead of the ordered 12mg  because "it makes me a robot." Encouragement offered to pt, she took 12mg  as ordered of invega this morning. Ativan 2mg  given as ordered PRN. Will continue to monitor.

## 2016-01-30 NOTE — Progress Notes (Signed)
Patient remained anxious and angry. Presented to the medication room and requested Ativan, 2 mg and Trazodone. Stayed in room reading then fell asleep. Patient is currently sleeping, eyes closed, respirations within normal limits. Safety precautions maintained.

## 2016-01-30 NOTE — Progress Notes (Signed)
Patient woke up and started pacing, in the hallway. Guarded, angry, blaming others "you all are mean, you dont want me to go home...dont you feel ashamed ? You are so evil..". Patient requested Ativan 0.5mg  as prescribed. Currently in room; remains angry and guarded. Safety precautions reinforced.

## 2016-01-31 MED ORDER — DIPHENHYDRAMINE HCL 25 MG PO CAPS
25.0000 mg | ORAL_CAPSULE | Freq: Every day | ORAL | Status: DC
  Administered 2016-01-31 – 2016-02-06 (×7): 25 mg via ORAL
  Filled 2016-01-31 (×8): qty 1

## 2016-01-31 MED ORDER — DIVALPROEX SODIUM ER 500 MG PO TB24
1500.0000 mg | ORAL_TABLET | Freq: Every day | ORAL | Status: DC
  Administered 2016-01-31 – 2016-02-06 (×6): 1500 mg via ORAL
  Filled 2016-01-31 (×8): qty 3

## 2016-01-31 NOTE — [facility] Group Notes (Signed)
[facility] Group Notes:  (Nursing/MHT/Case Management/Adjunct)  Date:  01/31/2016  Time:  6:41 PM  Type of Therapy:  Psychoeducational Skills  Participation Level:  Active  Participation Quality:  Appropriate, Attentive, Sharing and Supportive  Affect:  Appropriate  Cognitive:  Appropriate  Insight:  Appropriate  Engagement in Group:  Engaged and Supportive  Modes of Intervention:  Discussion, Education and Exploration  Summary of Progress/Problems:  Catherine Lester 01/31/2016, 6:41 PM

## 2016-01-31 NOTE — Progress Notes (Signed)
Pt to medication room this morning. More calm and cooperative this morning, no anger and verbal aggression noted this morning. Tearful/crying stating, "I don't want to take all 4 of them" [facility] to [facility]. States, "they make me feel like a robot." Pt was medication compliant after some encouragement. Ativan 2mg  PO PRN given as well. Will inform Md. Safety maintained. Will continue to monitor.

## 2016-01-31 NOTE — Progress Notes (Signed)
Patient requires some redirection on shift, at shift change she had a visit with her daughter, she was upset about taking medications tonight and voiced that she didn't need any more medications and wanted to request a second opinion.

## 2016-01-31 NOTE — Progress Notes (Signed)
Pt awake, alert, oriented and up on unit today. Minimal interactions with peers. Does interact with staff, but is angry, hostile, verbally aggressive when she does. Tearful/crying this morning. Currently more irritable and hostile, no longer crying. Appears sullen, guarded. Continues to voice delusions and paranoia about staff "conspiring against" her. Sits in her room staring out into nurse's station. Reports good sleep last night with the help of medication. Reports fair appetite, low energy, poor concentration. Rates depression 3/10, hopelessness 3/10 and anxiety 10/10 (low 0-10 high). Pt does take ativan as ordered PRN. Pt reports per self inventory that she is "withdrawing from cigarettes." Goal today is "being discharged" by "comply." Pt is medication compliant with encouragement.   Support and encouragement provided with use of therapeutic communication. Medications administered as ordered with education. Safety maintained with every 15 minute checks. Will continue to monitor.

## 2016-01-31 NOTE — Progress Notes (Signed)
Pt in room crying, lying in bed. Requests ativan, but it is not time for next dose. Pt has been taking ativan 2mg  PO every 4 hours as ordered PRN. Informed MD of her request. Went back into pt's room, sat down and listened, provided therapeutic communication, encouragement and support. Pt continues to be very delusional talking about how "10 employees got fired because of me the other night." States "everything anyone is doing for me here is humiliating me. Group makes me feel like a kindergartner, and when I'm done I feel like I need to lie down on a mat and take a nap." Pt continues to cry, talk loudly, yells over anyone trying to speak to her. Encouraged pt to continue to write her thoughts/feelings, she shows this nurse that she has been writing everything down. Encouraged pt to use coping skills learned during her stay. Safety maintained with every 15 minute checks. Will continue to monitor.

## 2016-01-31 NOTE — Progress Notes (Signed)
Pt reports to nurse that her jaw is tremoring/locking. Will inform Md. Safety maintained. Will continue to monitor.

## 2016-01-31 NOTE — Progress Notes (Signed)
Recreation Therapy Notes  Date: 11.13.17 Time: 9:30 am Location: Craft Room  Group Topic: Self-expression  Goal Area(s) Addresses:  Patient will identify one color per emotion listed on wheel. Patient will verbalize benefit of using art as a means of self-expression. Patient will verbalize one emotion experienced during session. Patient will be educated on other forms of self-expression.  Behavioral Response: Did not attend  Intervention: Emotion Wheel  Activity: Patients were given an Arboriculturistmotion Wheel worksheet with different emotions listed. Patients were instructed to pick a color for each emotion listed.  Education: LRT educated patients on different forms of self-expression.  Education Outcome: Patient did not attend group.  Clinical Observations/Feedback: Patient did not attend group.   Jacquelynn CreeGreene,Cassius Cullinane M, LRT/CTRS 01/31/2016 10:16 AM

## 2016-01-31 NOTE — Progress Notes (Addendum)
[facility]  01/31/2016 11:53 AM Catherine SergeChristine Koestner  MRN:  [id number]  Subjective:  Patient is a 50 year old female who reports to the ER by way of police after becoming agitated and acting bizarre at a local motel. Patient was a very poor historian due to thought blocking and delusion/confusion. Patient [place] [facility] [place] [facility] [place] to help her daughter. There was a dispute between the patient and her daughter and the daughter kicked her out of the house. Patient states that she went to a local motel where the employees started bullying her and not letting her sleep. She states that her daughter and her boyfriend told the employees to bully her. Patient states that she knows her family is here in the hospital admitted. She can hear them talking outside her door so she knows they are here. She also knows that there is an investigation going on. Patient is very agitated and states that she does not need to be here. She wants to go out and smoke. Patient has other delusions including that her phone and motel room was bugged by the police.  11/12 Patient continues to be paranoid and very irritable. Yesterday she was a little pleasant in the morning. However throughout the day she continues to ask the nursing staff about her discharge. This morning she does this clinician that everybody is colluding to not let her be discharged. She tells this clinician that even if she were to hire a lawyer that he would collude with the rest of her as and not let her be discharged. She is very irritable and starts getting loud and cursing staff. However she has not been physically aggressive so far. She did take Invega 12 mg with some coaxing from the nurse today. Denies suicidal or homicidal thoughts.  11/13 No change from last week she still labile ranging from crying to yelling and cussing. Has no insight into what brought her to the hospital. Continues to be paranoid and delusional about people being  after her and spying on her. She still thinks she is being setup and that is why she is here in the hospital. She is argumentative during assessment. I recommended a mood stabilizer, after much encouragement patient agrees with taking Depakote.   Per nursing: Pt in room crying, lying in bed. Requests ativan, but it is not time for next dose. Pt has been taking ativan 2mg  PO every 4 hours as ordered PRN. Informed MD of her request. Went back into pt's room, sat down and listened, provided therapeutic communication, encouragement and support. Pt continues to be very delusional talking about how "10 employees got fired because of me the other night." States "everything anyone is doing for me here is humiliating me. Group makes me feel like a kindergartner, and when I'm done I feel like I need to lie down on a mat and take a nap." Pt continues to cry, talk loudly, yells over anyone trying to speak to her. Encouraged pt to continue to write her thoughts/feelings, she shows this nurse that she has been writing everything down. Encouraged pt to use coping skills learned during her stay. Safety maintained with every 15 minute checks. Will continue to monitor.    Principal Problem: Bipolar 1 disorder, mixed, severe (HCC) Diagnosis:   Patient Active Problem List   Diagnosis Date Noted  . Alcohol use disorder, severe, dependence (HCC) [F10.20] 01/20/2016  . Alcohol withdrawal (HCC) [F10.239] 01/20/2016  . Cannabis use disorder, mild, abuse [  F12.10] 01/20/2016  . Tobacco use disorder [F17.200] 01/20/2016  . HTN (hypertension) [I10] 01/20/2016  . Hypothyroidism [E03.9] 01/20/2016  . Opioid use disorder, moderate, dependence (HCC) [F11.20] 01/20/2016  . Bipolar 1 disorder, mixed, severe (HCC) [F31.63] 01/19/2016   Total Time spent with patient: 30 minutes  Past Psychiatric History: Patient denies past psychiatric history. Patient's mother states that patient has been diagnosed with anxiety. She denies any  previous hospitalizations due to mental health reasons.  Past Medical History:  Past Medical History:  Diagnosis Date  . Anxiety   . Hx of heart valve insufficiency   . Hypertension   . Thyroid disease   . Urinary incontinence     Past Surgical History:  Procedure Laterality Date  . ANTERIOR FUSION CERVICAL SPINE     C5, C6  . BACK SURGERY    . TONSILLECTOMY    . TUBAL LIGATION     Family History: History reviewed. No pertinent family history.   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 Patient's mother does not know why she was fired. Patient has a daughter in [place] and a son. She is currently living with her parents and aunt.  Denies legal trouble.     History  Alcohol Use  . Yes    Comment: 3-4 times weekly      History  Drug Use  . Types: Marijuana    Social History   Social History  . Marital status: Single    Spouse name: N/A  . Number of children: N/A  . Years of education: N/A   Social History Main Topics  . Smoking status: Current Every Day Smoker    Packs/day: 1.50    Types: Cigarettes  . Smokeless tobacco: Never Used  . Alcohol use Yes     Comment: 3-4 times weekly   . Drug use:     Types: Marijuana  . Sexual activity: No   Other Topics Concern  . None   Social History Narrative  . None   Additional Social History:    History of alcohol / drug use?: Yes Negative Consequences of Use: Financial, Personal relationships, Work / Programmer, multimediachool     Current Medications: Current Facility-Administered Medications  Medication Dose Route Frequency Provider Last Rate Last Dose  . acetaminophen (TYLENOL) tablet 650 mg  650 mg Oral Q6H PRN Audery AmelJohn T Clapacs, MD      . alum & mag hydroxide-simeth (MAALOX/MYLANTA) 200-200-20 MG/5ML suspension 30 mL  30 mL Oral Q4H PRN Audery AmelJohn T Clapacs, MD   30 mL at 01/31/16 1036  . aspirin EC  tablet 81 mg  81 mg Oral Daily Audery AmelJohn T Clapacs, MD   81 mg at 01/31/16 0806  . diphenhydrAMINE (BENADRYL) capsule 25 mg  25 mg Oral Q8H PRN Jimmy FootmanAndrea Hernandez-Gonzalez, MD   25 mg at 01/31/16 0701  . diphenhydrAMINE (BENADRYL) capsule 25 mg  25 mg Oral QHS Jimmy FootmanAndrea Hernandez-Gonzalez, MD      . divalproex (DEPAKOTE ER) 24 hr tablet 1,500 mg  1,500 mg Oral QHS Jimmy FootmanAndrea Hernandez-Gonzalez, MD      . ibuprofen (ADVIL,MOTRIN) tablet 400 mg  400 mg Oral Q6H PRN Audery AmelJohn T Clapacs, MD   400 mg at 01/31/16 0806  . levothyroxine (SYNTHROID, LEVOTHROID) tablet 125 mcg  125 mcg Oral QAC breakfast Jimmy FootmanAndrea Hernandez-Gonzalez, MD   125 mcg at 01/31/16 0701  . lidocaine (  LIDODERM) 5 % 1 patch  1 patch Transdermal Q24H Audery Amel, MD   1 patch at 01/28/16 1729  . lisinopril (PRINIVIL,ZESTRIL) tablet 20 mg  20 mg Oral Daily Jimmy Footman, MD   20 mg at 01/31/16 0806  . LORazepam (ATIVAN) tablet 0.5 mg  0.5 mg Oral TID PRN Jimmy Footman, MD   0.5 mg at 01/30/16 0601  . magnesium hydroxide (MILK OF MAGNESIA) suspension 30 mL  30 mL Oral Daily PRN Audery Amel, MD      . nicotine (NICODERM CQ - dosed in mg/24 hours) patch 21 mg  21 mg Transdermal Daily Jimmy Footman, MD   21 mg at 01/31/16 0810  . paliperidone (INVEGA) 24 hr tablet 12 mg  12 mg Oral Daily Jimmy Footman, MD   12 mg at 01/31/16 0806  . simvastatin (ZOCOR) tablet 20 mg  20 mg Oral q1800 Audery Amel, MD   20 mg at 01/30/16 1737  . traZODone (DESYREL) tablet 100 mg  100 mg Oral QHS PRN Audery Amel, MD   100 mg at 01/30/16 2145  . vitamin B-12 (CYANOCOBALAMIN) tablet 1,000 mcg  1,000 mcg Oral Daily Jimmy Footman, MD   1,000 mcg at 01/31/16 1610    Lab Results:  No results found for this or any previous visit (from the past 48 hour(s)).  Blood Alcohol level:  Lab Results  Component Value Date   ETH <5 01/19/2016    Metabolic Disorder Labs: Lab Results  Component Value Date   HGBA1C 5.4  01/20/2016   MPG 108 01/20/2016   Lab Results  Component Value Date   PROLACTIN 54.9 (H) 01/20/2016   Lab Results  Component Value Date   CHOL 139 01/20/2016   TRIG 341 (H) 01/20/2016   HDL 32 (L) 01/20/2016   CHOLHDL 4.3 01/20/2016   VLDL 68 (H) 01/20/2016   LDLCALC 39 01/20/2016    Physical Findings: AIMS: Facial and Oral Movements Muscles of Facial Expression: None, normal Lips and Perioral Area: None, normal Jaw: None, normal Tongue: None, normal,Extremity Movements Upper (arms, wrists, hands, fingers): None, normal Lower (legs, knees, ankles, toes): None, normal, Trunk Movements Neck, shoulders, hips: None, normal, Overall Severity Severity of abnormal movements (highest score from questions above): None, normal Incapacitation due to abnormal movements: None, normal Patient's awareness of abnormal movements (rate only patient's report): No Awareness, Dental Status Current problems with teeth and/or dentures?: No Does patient usually wear dentures?: No  CIWA:  CIWA-Ar Total: 1 COWS:     Musculoskeletal: Strength & Muscle Tone: within normal limits Gait & Station: normal Patient leans: N/A  Psychiatric Specialty Exam: Physical Exam  Nursing note and vitals reviewed. Constitutional: She appears well-developed and well-nourished.  HENT:  Head: Normocephalic and atraumatic.  Eyes: Conjunctivae are normal. Pupils are equal, round, and reactive to Batte.  Neck: Normal range of motion.  Cardiovascular: Normal heart sounds.   Respiratory: Effort normal. No respiratory distress.  GI: Soft.  Musculoskeletal: Normal range of motion.  Neurological: She is alert.  Skin: Skin is warm and dry.    Review of Systems  Constitutional: Negative.  Negative for malaise/fatigue.  HENT: Negative.   Eyes: Negative.   Respiratory: Negative.   Cardiovascular: Negative.   Gastrointestinal: Negative.   Genitourinary: Negative.   Musculoskeletal: Positive for back pain.  Skin:  Negative.   Neurological: Negative.   Psychiatric/Behavioral: Positive for substance abuse. Negative for depression, hallucinations, memory loss and suicidal ideas. The patient has insomnia. The patient is  not nervous/anxious.   All other systems reviewed and are negative.   Blood pressure 133/78, pulse 75, temperature 98.3 F (36.8 C), temperature source Oral, resp. rate 18, height 5\' 2"  (1.575 m), weight 83 kg (182 lb 15.7 oz), SpO2 100 %.Body mass index is 33.47 kg/m.  General Appearance: Casual  Eye Contact:  Fair  Speech:  Clear and Coherent  Volume:  Normal  Mood:  Depressed, irritable   Affect:  Congruent  Thought Process:  Goal Directed  Orientation:  Full (Time, Place, and Person)  Thought Content:  Logical  Suicidal Thoughts:  No  Homicidal Thoughts:  No  Memory:  Immediate;   Good Recent;   Fair Remote;   Fair  Judgement:  Fair  Insight:  Lacking  Psychomotor Activity:  Normal  Concentration:  Concentration: Poor and Attention Span: Poor  Recall:  Poor  Fund of Knowledge:  Fair  Language:  Fair  Akathisia:  No  Handed:  Right  AIMS (if indicated):     Assets:  Communication Skills Desire for Improvement Physical Health Resilience  ADL's:  Intact  Cognition:  WNL  Sleep:  Number of Hours: 7.45     Treatment Plan Summary: Daily contact with patient to assess and evaluate symptoms and progress in treatment and Medication management   Minimal improvement since admission. She will be started on Depakote in addition to the 12 mg of Invega. I will try to schedule a family meeting with patient's daughter  Per [employer] report, patient had come down to "help" her daughter who lives here and to give the daughter her car. The daughter kicked the patient out who went to stay in a motel. Police were called by the motel staff due to issues between the patient and their staff, the patient going into areas where she didn't belong and for "hearing voices."  R/o  Bipolar disorder: Unclear diagnosis at this time. Patient does not have a prior psychiatric history has never received inpatient or outpatient treatment. Prior to admission with thoughts patient was taking Paxil. Per collateral information obtained from the daughter  the symptoms have been present for at least 30 days (since she came to [facility])  -Bipolar d/o: Patient continues to display significant hostility, and severe irritability. Continues to believe she was set up by her daughter and her daughter's boyfriend.  Invega  Increased to 12 mg qhs on 11/6.  Continues to be very reluctant about taking the higher dose of Invega. He says that he makes her feel like a robot. Even though patient complains of this there is no evidence of rigidity or any EPS.  Tonight I we'll start 5000 mg of Depakote ER  -Irritability and agitation: Continue with Ativan 0.5 mg 3 times a day as needed. Patient has been requesting the higher dose of Ativan 2 mg very often disorder has been discontinued  -Alcohol use disorder: Patient's daughter report patient drinks heavily.  In need of substance abuse treatment upon discharge  - Alcohol withdrawal: resolved  -Hypertension:  continued with lisinopril 20 mg by mouth daily  -Dyslipidemia: continued with zocor daily  -Cardiovascular disease: Continue aspirin daily  -hypothyroid- Patient continued on levothyroxine  -History of chronic pain: Patient is currently receiving a Lidoderm patch apply to lower back, she has also order is for ibuprofen when necessary. She has past history of abusing narcotics.  -UTI: treated with fosfomycin  -B12 deficiency: continue B12 oral  -Tobacco use disorder: nicotine patch 21 mg  Precautions every 15  minute checks  Vital signs daily  Diet low sodium  Hospitalization status involuntary commitment--IVC hearing was held on 11/7  Labs: HIV -, RPR -, B12 low and ammonia level wnl.  HbA1c and lipid panel have been  completed  Imaging:  MRI of the brain no acute process  Possible d/c in about 7 days by her primary team  I certify that the services received since the previous certification/recertification were and continue to be medically necessary as the treatment provided can be reasonably expected to improve the patient's condition; the medical record documents that the services furnished were intensive treatment services or their equivalent services, and this patient continues to need, on a daily basis, active treatment furnished directly by or requiring the supervision of inpatient psychiatric personnel.   Jimmy Footman, MD 01/31/2016, 11:53 AM

## 2016-01-31 NOTE — Plan of Care (Signed)
Problem: Education: Goal: Verbalization of understanding the information provided will improve Outcome: Not Progressing Pt continues to be delusional, paranoid, thinking staff is "conspiring against her." Unable to be redirected with thoughts, firm/concrete on thinking.

## 2016-01-31 NOTE — [facility] Group Notes (Signed)
[facility] Group Notes:  (Nursing/MHT/Case Management/Adjunct)  Date:  01/31/2016  Time:  12:44 AM  Type of Therapy:  Psychoeducational Skills  Participation Level:  Active  Participation Quality:  Appropriate and Sharing  Affect:  Appropriate  Cognitive:  Appropriate  Insight:  Appropriate  Engagement in Group:  Engaged  Modes of Intervention:  Discussion, Socialization and Support  Summary of Progress/Problems:  Catherine Lester 01/31/2016, 12:44 AM

## 2016-02-01 MED ORDER — OLANZAPINE 5 MG PO TBDP
15.0000 mg | ORAL_TABLET | Freq: Every day | ORAL | Status: DC
  Administered 2016-02-01: 15 mg via ORAL
  Filled 2016-02-01: qty 3

## 2016-02-01 NOTE — Progress Notes (Signed)
Patient irritable this shift. Requests pain medicine for back and head, but refuses lidocaine patch. Patient reports "nurse with gray and white hair is calling her daughter and telling her that she will be arrested if she comes up here again" Pt angry and agitated. Requires redirection at times. Denies SI, HI, AVH. No group or peer interaction. Minimal staff interaction, only glares at staff. Encouragement and support offered. Pt receptive and remains safe on unit with q 15 min checks.

## 2016-02-01 NOTE — [facility] Group Notes (Signed)
[facility] Group Notes:  (Nursing/MHT/Case Management/Adjunct)  Date:  02/01/2016  Time:  5:26 AM  Type of Therapy:  Psychoeducational Skills  Participation Level:  Active  Participation Quality:  Appropriate  Affect:  Appropriate  Cognitive:  Alert  Insight:  Good  Engagement in Group:  Engaged  Modes of Intervention:  Support  Summary of Progress/Problems:  Catherine Lester 02/01/2016, 5:26 AM

## 2016-02-01 NOTE — Plan of Care (Signed)
Problem: Education: Goal: Emotional status will improve Outcome: Progressing Patient continues to be angry and agitated, blaming others for her situation

## 2016-02-01 NOTE — Progress Notes (Signed)
[facility] MD Progress Note  02/01/2016 4:42 PM Kristen Fromm  MRN:  [id number]  Subjective:  Patient is a 50 year old female who reports to the ER by way of police after becoming agitated and acting bizarre at a local motel. Patient was a very poor historian due to thought blocking and delusion/confusion. Patient states that she came to [facility] from [place] to help her daughter. There was a dispute between the patient and her daughter and the daughter kicked her out of the house. Patient states that she went to a local motel where the employees started bullying her and not letting her sleep. She states that her daughter and her boyfriend told the employees to bully her. Patient states that she knows her family is here in the hospital admitted. She can hear them talking outside her door so she knows they are here. She also knows that there is an investigation going on. Patient is very agitated and states that she does not need to be here. She wants to go out and smoke. Patient has other delusions including that her phone and motel room was bugged by the police.  11/12 Patient continues to be paranoid and very irritable. Yesterday she was a little pleasant in the morning. However throughout the day she continues to ask the nursing staff about her discharge. This morning she does this clinician that everybody is colluding to not let her be discharged. She tells this clinician that even if she were to hire a lawyer that he would collude with the rest of her as and not let her be discharged. She is very irritable and starts getting loud and cursing staff. However she has not been physically aggressive so far. She did take Invega 12 mg with some coaxing from the nurse today. Denies suicidal or homicidal thoughts.  11/13 No change from last week she still labile ranging from crying to yelling and cussing. Has no insight into what brought her to the hospital. Continues to be paranoid and delusional about people being  after her and spying on her. She still thinks she is being setup and that is why she is here in the hospital. She is argumentative during assessment. I recommended a mood stabilizer, after much encouragement patient agrees with taking Depakote.   11/14 Daughter says yesterday she was less labile and not as hostile towards here. She however is reported as still delusional hearing voices telecommunication trough the walkie talkies. Nurses picking on her. Here pt continues to fight and acuses staff of making fun of her, laughing at her not attending to her needs. She she yells at me this morning says that I had not listened to her she still thinks she is here because of what somebody else said to about her prior to admission. Maisie Fus impossible to have a conversation with her. She gets very loud and starts using profanity.     Per nursing:  Patient irritable this shift. Requests pain medicine for back and head, but refuses lidocaine patch. Patient reports "nurse with gray and white hair is calling her daughter and telling her that she will be arrested if she comes up here again" Pt angry and agitated. Requires redirection at times. Denies SI, HI, AVH. No group or peer interaction. Minimal staff interaction, only glares at staff. Encouragement and support offered. Pt receptive and remains safe on unit with q 15 min checks.  Principal Problem: Bipolar 1 disorder, mixed, severe (HCC) Diagnosis:   Patient Active Problem List   Diagnosis  Date Noted  . Alcohol use disorder, severe, dependence (HCC) [F10.20] 01/20/2016  . Alcohol withdrawal (HCC) [F10.239] 01/20/2016  . Cannabis use disorder, mild, abuse [F12.10] 01/20/2016  . Tobacco use disorder [F17.200] 01/20/2016  . HTN (hypertension) [I10] 01/20/2016  . Hypothyroidism [E03.9] 01/20/2016  . Opioid use disorder, moderate, dependence (HCC) [F11.20] 01/20/2016  . Bipolar 1 disorder, mixed, severe (HCC) [F31.63] 01/19/2016   Total Time spent with  patient: 30 minutes  Past Psychiatric History: Patient denies past psychiatric history. Patient's mother states that patient has been diagnosed with anxiety. She denies any previous hospitalizations due to mental health reasons.  Past Medical History:  Past Medical History:  Diagnosis Date  . Anxiety   . Hx of heart valve insufficiency   . Hypertension   . Thyroid disease   . Urinary incontinence     Past Surgical History:  Procedure Laterality Date  . ANTERIOR FUSION CERVICAL SPINE     C5, C6  . BACK SURGERY    . TONSILLECTOMY    . TUBAL LIGATION     Family History: History reviewed. No pertinent family history.   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 Patient's mother does not know why she was fired. Patient has a daughter in [place] and a son. She is currently living with her parents and aunt.  Denies legal trouble.     History  Alcohol Use  . Yes    Comment: 3-4 times weekly      History  Drug Use  . Types: Marijuana    Social History   Social History  . Marital status: Single    Spouse name: N/A  . Number of children: N/A  . Years of education: N/A   Social History Main Topics  . Smoking status: Current Every Day Smoker    Packs/day: 1.50    Types: Cigarettes  . Smokeless tobacco: Never Used  . Alcohol use Yes     Comment: 3-4 times weekly   . Drug use:     Types: Marijuana  . Sexual activity: No   Other Topics Concern  . None   Social History Narrative  . None   Additional Social History:    History of alcohol / drug use?: Yes Negative Consequences of Use: Financial, Personal relationships, Work / Programmer, multimedia     Current Medications: Current Facility-Administered Medications  Medication Dose Route Frequency Provider Last Rate Last Dose  . acetaminophen (TYLENOL) tablet 650 mg  650 mg Oral Q6H PRN Audery Amel, MD   650 mg at 02/01/16 1534  . alum & mag hydroxide-simeth (MAALOX/MYLANTA) 200-200-20 MG/5ML suspension 30 mL  30 mL Oral Q4H PRN Audery Amel, MD   30 mL at 01/31/16 1036  . aspirin EC tablet 81 mg  81 mg Oral Daily Audery Amel, MD   81 mg at 02/01/16 0817  . diphenhydrAMINE (BENADRYL) capsule 25 mg  25 mg Oral Q8H PRN Jimmy Footman, MD   25 mg at 02/01/16 0558  . diphenhydrAMINE (BENADRYL) capsule 25 mg  25 mg Oral QHS Jimmy Footman, MD   25 mg at 01/31/16 2138  . divalproex (DEPAKOTE ER) 24 hr tablet 1,500 mg  1,500 mg Oral QHS Jimmy Footman, MD   1,500 mg at 01/31/16 2140  . ibuprofen (ADVIL,MOTRIN) tablet 400 mg  400 mg Oral Q6H PRN Audery Amel,  MD   400 mg at 02/01/16 1142  . levothyroxine (SYNTHROID, LEVOTHROID) tablet 125 mcg  125 mcg Oral QAC breakfast Jimmy FootmanAndrea Hernandez-Gonzalez, MD   125 mcg at 02/01/16 0558  . lidocaine (LIDODERM) 5 % 1 patch  1 patch Transdermal Q24H Audery AmelJohn T Clapacs, MD   1 patch at 01/28/16 1729  . lisinopril (PRINIVIL,ZESTRIL) tablet 20 mg  20 mg Oral Daily Jimmy FootmanAndrea Hernandez-Gonzalez, MD   20 mg at 02/01/16 0817  . LORazepam (ATIVAN) tablet 0.5 mg  0.5 mg Oral TID PRN Jimmy FootmanAndrea Hernandez-Gonzalez, MD   0.5 mg at 02/01/16 0816  . magnesium hydroxide (MILK OF MAGNESIA) suspension 30 mL  30 mL Oral Daily PRN Audery AmelJohn T Clapacs, MD      . nicotine (NICODERM CQ - dosed in mg/24 hours) patch 21 mg  21 mg Transdermal Daily Jimmy FootmanAndrea Hernandez-Gonzalez, MD   21 mg at 02/01/16 0817  . paliperidone (INVEGA) 24 hr tablet 12 mg  12 mg Oral Daily Jimmy FootmanAndrea Hernandez-Gonzalez, MD   12 mg at 02/01/16 0816  . simvastatin (ZOCOR) tablet 20 mg  20 mg Oral q1800 Audery AmelJohn T Clapacs, MD   20 mg at 01/31/16 1846  . traZODone (DESYREL) tablet 100 mg  100 mg Oral QHS PRN Audery AmelJohn T Clapacs, MD   100 mg at 01/31/16 2041  . vitamin B-12 (CYANOCOBALAMIN) tablet 1,000 mcg  1,000 mcg Oral Daily Jimmy FootmanAndrea Hernandez-Gonzalez, MD   1,000 mcg at 02/01/16 [id number]    Lab  Results:  No results found for this or any previous visit (from the past 48 hour(s)).  Blood Alcohol level:  Lab Results  Component Value Date   ETH <5 01/19/2016    Metabolic Disorder Labs: Lab Results  Component Value Date   HGBA1C 5.4 01/20/2016   MPG 108 01/20/2016   Lab Results  Component Value Date   PROLACTIN 54.9 (H) 01/20/2016   Lab Results  Component Value Date   CHOL 139 01/20/2016   TRIG 341 (H) 01/20/2016   HDL 32 (L) 01/20/2016   CHOLHDL 4.3 01/20/2016   VLDL 68 (H) 01/20/2016   LDLCALC 39 01/20/2016    Physical Findings: AIMS: Facial and Oral Movements Muscles of Facial Expression: None, normal Lips and Perioral Area: None, normal Jaw: None, normal Tongue: None, normal,Extremity Movements Upper (arms, wrists, hands, fingers): None, normal Lower (legs, knees, ankles, toes): None, normal, Trunk Movements Neck, shoulders, hips: None, normal, Overall Severity Severity of abnormal movements (highest score from questions above): None, normal Incapacitation due to abnormal movements: None, normal Patient's awareness of abnormal movements (rate only patient's report): No Awareness, Dental Status Current problems with teeth and/or dentures?: No Does patient usually wear dentures?: No  CIWA:  CIWA-Ar Total: 1 COWS:     Musculoskeletal: Strength & Muscle Tone: within normal limits Gait & Station: normal Patient leans: N/A  Psychiatric Specialty Exam: Physical Exam  Nursing note and vitals reviewed. Constitutional: She appears well-developed and well-nourished.  HENT:  Head: Normocephalic and atraumatic.  Eyes: Conjunctivae are normal. Pupils are equal, round, and reactive to Whyte.  Neck: Normal range of motion.  Cardiovascular: Normal heart sounds.   Respiratory: Effort normal. No respiratory distress.  GI: Soft.  Musculoskeletal: Normal range of motion.  Neurological: She is alert.  Skin: Skin is warm and dry.    Review of Systems   Constitutional: Negative.  Negative for malaise/fatigue.  HENT: Negative.   Eyes: Negative.   Respiratory: Negative.   Cardiovascular: Negative.   Gastrointestinal: Negative.   Genitourinary: Negative.  Musculoskeletal: Positive for back pain.  Skin: Negative.   Neurological: Negative.   Psychiatric/Behavioral: Positive for substance abuse. Negative for depression, hallucinations, memory loss and suicidal ideas. The patient has insomnia. The patient is not nervous/anxious.   All other systems reviewed and are negative.   Blood pressure 136/77, pulse 70, temperature 97.9 F (36.6 C), temperature source Oral, resp. rate 18, height 5\' 2"  (1.575 m), weight 83 kg (182 lb 15.7 oz), SpO2 100 %.Body mass index is 33.47 kg/m.  General Appearance: Casual  Eye Contact:  Fair  Speech:  Clear and Coherent  Volume:  Normal  Mood:  Depressed, irritable   Affect:  Congruent  Thought Process:  Goal Directed  Orientation:  Full (Time, Place, and Person)  Thought Content:  Logical  Suicidal Thoughts:  No  Homicidal Thoughts:  No  Memory:  Immediate;   Good Recent;   Fair Remote;   Fair  Judgement:  Fair  Insight:  Lacking  Psychomotor Activity:  Normal  Concentration:  Concentration: Poor and Attention Span: Poor  Recall:  Poor  Fund of Knowledge:  Fair  Language:  Fair  Akathisia:  No  Handed:  Right  AIMS (if indicated):     Assets:  Communication Skills Desire for Improvement Physical Health Resilience  ADL's:  Intact  Cognition:  WNL  Sleep:  Number of Hours: 7.5     Treatment Plan Summary: Daily contact with patient to assess and evaluate symptoms and progress in treatment and Medication management   Minimal improvement since admission. She will be started on Depakote in addition to the 12 mg of Invega. I will try to schedule a family meeting with patient's daughter  Per [employer] report, patient had come down to "help" her daughter who lives here and to give the  daughter her car. The daughter kicked the patient out who went to stay in a motel. Police were called by the motel staff due to issues between the patient and their staff, the patient going into areas where she didn't belong and for "hearing voices."  R/o Bipolar disorder: Unclear diagnosis at this time. Patient does not have a prior psychiatric history has never received inpatient or outpatient treatment. Prior to admission with thoughts patient was taking Paxil. Per collateral information obtained from the daughter  the symptoms have been present for at least 30 days (since she came to [facility])  -Bipolar d/o: Patient continues to display significant hostility, and severe irritability. Continues to believe she was set up by her daughter and her daughter's boyfriend.  Invega  Increased to 12 mg qhs on 11/6.  Continues to be very reluctant about taking the higher dose of Invega. Despite her compliant with the higher dose of Invega off over the last several days patient does not appear to be responding to these antipsychotic. We will discontinue the Invega and tried olanzapine starting this evening. I discussed this with the patient's daughter she agrees that her mother has only minimally improved.  Started on depakote ER 1500 mg qhs  -Irritability and agitation: Continue with Ativan 0.5 mg 3 times a day as needed.   -Alcohol use disorder: Patient's daughter report patient drinks heavily.  In need of substance abuse treatment upon discharge  - Alcohol withdrawal: resolved  -Hypertension:  continued with lisinopril 20 mg by mouth daily  -Dyslipidemia: continued with zocor daily  -Cardiovascular disease: Continue aspirin daily  -hypothyroid- Patient continued on levothyroxine  -History of chronic pain: Patient is currently receiving a Lidoderm  patch apply to lower back, she has also order is for ibuprofen when necessary. She has past history of abusing narcotics.  -UTI: treated with  fosfomycin  -B12 deficiency: continue B12 oral  -Tobacco use disorder: nicotine patch 21 mg  Precautions every 15 minute checks  Vital signs daily  Diet low sodium  Hospitalization status involuntary commitment--IVC hearing was held on 11/7  Labs: HIV -, RPR -, B12 low and ammonia level wnl.  HbA1c and lipid panel have been completed  Imaging:  MRI of the brain no acute process  Possible d/c in about 7 days by her primary team  Jimmy FootmanHernandez-Gonzalez,  Navika Hoopes, MD 02/01/2016, 4:42 PM

## 2016-02-01 NOTE — [facility] Group Notes (Signed)
[facility] Group Notes:  (Nursing/MHT/Case Management/Adjunct)  Date:  02/01/2016  Time:  10:01 PM  Type of Therapy:  Evening Wrap-up Group  Participation Level:  Minimal  Participation Quality:  Minimal  Affect:  Irritable  Cognitive:  Flat  Insight:  None  Engagement in Group:  Limited  Modes of Intervention:  Discussion  Summary of Progress/Problems:  Tomasita MorrowChelsea Nanta Kalynn Declercq 02/01/2016, 10:01 PM

## 2016-02-01 NOTE — Progress Notes (Signed)
Recreation Therapy Notes  Date: 11.14.17 Time: 9:30 am Location: Craft Room  Group Topic: Goal Setting  Goal Area(s) Addresses:   Behavioral Response: Irritable, Left early  Intervention: Recovery Goal Chart  Activity: Patients were instructed to make a Recovery Goal Chart including their goals, obstacles, the date they started working on the goals, and the date they achieved their goals.  Education: LRT educated patients on ways they can celebrate reaching their goals in a healthy way.  Education Outcome: Patient left before LRT educated group.   Clinical Observations/Feedback: Patient was upset that she could not be in the day room. LRT informed patient the day room would be opened after group. Patient left group at approximately 9:41 am and did not return to group.  Jacquelynn CreeGreene,Elizabeth M, LRT/CTRS 02/01/2016 10:10 AM

## 2016-02-01 NOTE — [facility] Group Notes (Signed)
[facility] Group Notes:  (Nursing/MHT/Case Management/Adjunct)  Date:  02/01/2016  Time:  2:26 PM  Type of Therapy:  Psychoeducational Skills  Participation Level:  Did Not Attend    Catherine Farberamela M Moses Lester 02/01/2016, 2:26 PM

## 2016-02-01 NOTE — Progress Notes (Signed)
Patient has been sleeping. No sign of discomfort and staff continue to monitor for safety.

## 2016-02-02 MED ORDER — OLANZAPINE 5 MG PO TBDP
20.0000 mg | ORAL_TABLET | Freq: Every day | ORAL | Status: DC
  Administered 2016-02-02 – 2016-02-03 (×2): 20 mg via ORAL
  Filled 2016-02-02 (×2): qty 4

## 2016-02-02 MED ORDER — IBUPROFEN 600 MG PO TABS
600.0000 mg | ORAL_TABLET | Freq: Four times a day (QID) | ORAL | Status: DC | PRN
  Administered 2016-02-02 – 2016-02-07 (×12): 600 mg via ORAL
  Filled 2016-02-02 (×12): qty 1

## 2016-02-02 NOTE — Plan of Care (Signed)
Problem: Coping: Goal: Ability to verbalize frustrations and anger appropriately will improve Outcome: Progressing Pt angry and irritable. Pt did not have any outbursts this evening though.

## 2016-02-02 NOTE — Tx Team (Signed)
Interdisciplinary Treatment and Diagnostic Plan Update  02/02/2016 Time of Session: 10:30AM Catherine SergeChristine Lester MRN: [id number]  Principal Diagnosis: Bipolar 1 disorder, mixed, severe (HCC)  Secondary Diagnoses: Principal Problem:   Bipolar 1 disorder, mixed, severe (HCC) Active Problems:   Alcohol use disorder, severe, dependence (HCC)   Alcohol withdrawal (HCC)   Cannabis use disorder, mild, abuse   Tobacco use disorder   HTN (hypertension)   Hypothyroidism   Opioid use disorder, moderate, dependence (HCC)   Current Medications:  Current Facility-Administered Medications  Medication Dose Route Frequency Provider Last Rate Last Dose  . acetaminophen (TYLENOL) tablet 650 mg  650 mg Oral Q6H PRN Audery AmelJohn T Clapacs, MD   650 mg at 02/01/16 1534  . alum & mag hydroxide-simeth (MAALOX/MYLANTA) 200-200-20 MG/5ML suspension 30 mL  30 mL Oral Q4H PRN Audery AmelJohn T Clapacs, MD   30 mL at 01/31/16 1036  . aspirin EC tablet 81 mg  81 mg Oral Daily Audery AmelJohn T Clapacs, MD   81 mg at 02/02/16 0830  . diphenhydrAMINE (BENADRYL) capsule 25 mg  25 mg Oral Q8H PRN Jimmy FootmanAndrea Hernandez-Gonzalez, MD   25 mg at 02/01/16 0558  . diphenhydrAMINE (BENADRYL) capsule 25 mg  25 mg Oral QHS Jimmy FootmanAndrea Hernandez-Gonzalez, MD   25 mg at 02/01/16 2118  . divalproex (DEPAKOTE ER) 24 hr tablet 1,500 mg  1,500 mg Oral QHS Jimmy FootmanAndrea Hernandez-Gonzalez, MD   1,500 mg at 02/01/16 2118  . ibuprofen (ADVIL,MOTRIN) tablet 400 mg  400 mg Oral Q6H PRN Audery AmelJohn T Clapacs, MD   400 mg at 02/02/16 1010  . levothyroxine (SYNTHROID, LEVOTHROID) tablet 125 mcg  125 mcg Oral QAC breakfast Jimmy FootmanAndrea Hernandez-Gonzalez, MD   125 mcg at 02/02/16 [phone]  . lidocaine (LIDODERM) 5 % 1 patch  1 patch Transdermal Q24H Audery AmelJohn T Clapacs, MD   1 patch at 01/28/16 1729  . lisinopril (PRINIVIL,ZESTRIL) tablet 20 mg  20 mg Oral Daily Jimmy FootmanAndrea Hernandez-Gonzalez, MD   20 mg at 02/02/16 0827  . LORazepam (ATIVAN) tablet 0.5 mg  0.5 mg Oral TID PRN Jimmy FootmanAndrea Hernandez-Gonzalez, MD   0.5 mg at  02/02/16 1010  . magnesium hydroxide (MILK OF MAGNESIA) suspension 30 mL  30 mL Oral Daily PRN Audery AmelJohn T Clapacs, MD      . nicotine (NICODERM CQ - dosed in mg/24 hours) patch 21 mg  21 mg Transdermal Daily Jimmy FootmanAndrea Hernandez-Gonzalez, MD   21 mg at 02/02/16 0830  . OLANZapine zydis (ZYPREXA) disintegrating tablet 15 mg  15 mg Oral QHS Jimmy FootmanAndrea Hernandez-Gonzalez, MD   15 mg at 02/01/16 2118  . simvastatin (ZOCOR) tablet 20 mg  20 mg Oral q1800 Audery AmelJohn T Clapacs, MD   20 mg at 02/01/16 1809  . traZODone (DESYREL) tablet 100 mg  100 mg Oral QHS PRN Audery AmelJohn T Clapacs, MD   100 mg at 02/01/16 2118  . vitamin B-12 (CYANOCOBALAMIN) tablet 1,000 mcg  1,000 mcg Oral Daily Jimmy FootmanAndrea Hernandez-Gonzalez, MD   1,000 mcg at 02/02/16 0827   PTA Medications: Prescriptions Prior to Admission  Medication Sig Dispense Refill Last Dose  . amoxicillin (AMOXIL) 875 MG tablet Take 875 mg by mouth 2 (two) times daily.     Marland Kitchen. aspirin 81 MG chewable tablet Chew by mouth daily.     . fluticasone (FLONASE) 50 MCG/ACT nasal spray Place into both nostrils daily.     Marland Kitchen. gabapentin (NEURONTIN) 300 MG capsule Take 300 mg by mouth daily.     Marland Kitchen. ibuprofen (ADVIL,MOTRIN) 600 MG tablet Take 600 mg by mouth.     .Marland Kitchen  levothyroxine (SYNTHROID, LEVOTHROID) 125 MCG tablet Take 125 mcg by mouth daily before breakfast.     . lisinopril (PRINIVIL,ZESTRIL) 20 MG tablet Take 20 mg by mouth daily.     . Multiple Vitamins-Minerals (MULTIVITAMIN WITH MINERALS) tablet Take 1 tablet by mouth daily.     Marland Kitchen. NIFEdipine (PROCARDIA-XL/ADALAT CC) 30 MG 24 hr tablet Take 30 mg by mouth daily.     Marland Kitchen. NIFEdipine (PROCARDIA-XL/ADALAT-CC/NIFEDICAL-XL) 30 MG 24 hr tablet Take 30 mg by mouth daily.     . Probiotic Product (PROBIOTIC ACIDOPHILUS BEADS PO) Take by mouth.     . Pumpkin Seed-Soy Germ (AZO BLADDER CONTROL/GO-LESS PO) Take by mouth.     . traMADol (ULTRAM) 50 MG tablet Take by mouth every 6 (six) hours as needed.       Patient Stressors: Financial  difficulties Marital or family conflict Medication change or noncompliance Substance abuse  Patient Strengths: Manufacturing systems engineerCommunication skills Supportive family/friends  Treatment Modalities: Medication Management, Group therapy, Case management,  1 to 1 session with clinician, Psychoeducation, Recreational therapy.   Physician Treatment Plan for Primary Diagnosis: Bipolar 1 disorder, mixed, severe (HCC) Long Term Goal(s): Improvement in symptoms so as ready for discharge Improvement in symptoms so as ready for discharge   Short Term Goals: Ability to identify changes in lifestyle to reduce recurrence of condition will improve Ability to verbalize feelings will improve Ability to demonstrate self-control will improve Ability to identify and develop effective coping behaviors will improve Compliance with prescribed medications will improve Ability to identify triggers associated with substance abuse/mental health issues will improve Ability to identify changes in lifestyle to reduce recurrence of condition will improve Ability to verbalize feelings will improve Ability to demonstrate self-control will improve Ability to identify and develop effective coping behaviors will improve Ability to identify triggers associated with substance abuse/mental health issues will improve  Medication Management: Evaluate patient's response, side effects, and tolerance of medication regimen.  Therapeutic Interventions: 1 to 1 sessions, Unit Group sessions and Medication administration.  Evaluation of Outcomes: Progressing  Physician Treatment Plan for Secondary Diagnosis: Principal Problem:   Bipolar 1 disorder, mixed, severe (HCC) Active Problems:   Alcohol use disorder, severe, dependence (HCC)   Alcohol withdrawal (HCC)   Cannabis use disorder, mild, abuse   Tobacco use disorder   HTN (hypertension)   Hypothyroidism   Opioid use disorder, moderate, dependence (HCC)  Long Term Goal(s): Improvement  in symptoms so as ready for discharge Improvement in symptoms so as ready for discharge   Short Term Goals: Ability to identify changes in lifestyle to reduce recurrence of condition will improve Ability to verbalize feelings will improve Ability to demonstrate self-control will improve Ability to identify and develop effective coping behaviors will improve Compliance with prescribed medications will improve Ability to identify triggers associated with substance abuse/mental health issues will improve Ability to identify changes in lifestyle to reduce recurrence of condition will improve Ability to verbalize feelings will improve Ability to demonstrate self-control will improve Ability to identify and develop effective coping behaviors will improve Ability to identify triggers associated with substance abuse/mental health issues will improve     Medication Management: Evaluate patient's response, side effects, and tolerance of medication regimen.  Therapeutic Interventions: 1 to 1 sessions, Unit Group sessions and Medication administration.  Evaluation of Outcomes: Progressing   RN Treatment Plan for Primary Diagnosis: Bipolar 1 disorder, mixed, severe (HCC) Long Term Goal(s): Knowledge of disease and therapeutic regimen to maintain health will improve  Short Term Goals: Ability  to remain free from injury will improve, Ability to verbalize frustration and anger appropriately will improve, Ability to demonstrate self-control and Ability to identify and develop effective coping behaviors will improve  Medication Management: RN will administer medications as ordered by provider, will assess and evaluate patient's response and provide education to patient for prescribed medication. RN will report any adverse and/or side effects to prescribing provider.  Therapeutic Interventions: 1 on 1 counseling sessions, Psychoeducation, Medication administration, Evaluate responses to treatment, Monitor  vital signs and CBGs as ordered, Perform/monitor CIWA, COWS, AIMS and Fall Risk screenings as ordered, Perform wound care treatments as ordered.  Evaluation of Outcomes: Progressing   LCSW Treatment Plan for Primary Diagnosis: Bipolar 1 disorder, mixed, severe (HCC) Long Term Goal(s): Safe transition to appropriate next level of care at discharge, Engage patient in therapeutic group addressing interpersonal concerns.  Short Term Goals: Engage patient in aftercare planning with referrals and resources, Increase social support, Increase emotional regulation, Identify triggers associated with mental health/substance abuse issues and Increase skills for wellness and recovery  Therapeutic Interventions: Assess for all discharge needs, 1 to 1 time with Social worker, Explore available resources and support systems, Assess for adequacy in community support network, Educate family and significant other(s) on suicide prevention, Complete Psychosocial Assessment, Interpersonal group therapy.  Evaluation of Outcomes: Progressing   Progress in Treatment: Attending groups: Yes. Participating in groups: Yes. Taking medication as prescribed: Yes. Toleration medication: Yes. Family/Significant other contact made: Yes, CSW contacted daughter. Patient understands diagnosis: Yes. Discussing patient identified problems/goals with staff: Yes. Medical problems stabilized or resolved: Yes. Denies suicidal/homicidal ideation: Yes. Issues/concerns per patient self-inventory: No.   New problem(s) identified: No, Describe:  None identified   New Short Term/Long Term Goal(s):  Discharge Plan or Barriers:   Reason for Continuation of Hospitalization: Depression Hallucinations  Estimated Length of Stay: 3-5 days   Attendees: Patient: Shaconda Hajduk  02/02/2016 10:49 AM  Physician: Dr. Radene Journey, MD  02/02/2016 10:49 AM  Nursing: Leonia Reader, RN 02/02/2016 10:49 AM  RN Care Manager: 02/02/2016  10:49 AM  Social Worker: Hampton Abbot, MSW, LCSW-A 02/02/2016 10:49 AM  Recreational Therapist: Hershal Coria, LRT, CTRS 02/02/2016 10:49 AM   Scribe for Treatment Team: Lynden Oxford, LCSWA 02/02/2016 10:49 AM

## 2016-02-02 NOTE — Progress Notes (Signed)
[facility]  02/02/2016 11:39 AM Catherine SergeChristine Fontan  MRN:  [id number]  Subjective:  Patient is a 50 year old female who reports to the ER by way of police after becoming agitated and acting bizarre at a local motel. Patient was a very poor historian due to thought blocking and delusion/confusion. Patient states that [facility] [place] to help her daughter. There was a dispute between the patient and her daughter and the daughter kicked her out of the house. Patient states that she went to a local motel where the employees started bullying her and not letting her sleep. She states that her daughter and her boyfriend told the employees to bully her. Patient states that she knows her family is here in the hospital admitted. She can hear them talking outside her door so she knows they are here. She also knows that there is an investigation going on. Patient is very agitated and states that she does not need to be here. She wants to go out and smoke. Patient has other delusions including that her phone and motel room was bugged by the police.  11/12 Patient continues to be paranoid and very irritable. Yesterday she was a little pleasant in the morning. However throughout the day she continues to ask the nursing staff about her discharge. This morning she does this clinician that everybody is colluding to not let her be discharged. She tells this clinician that even if she were to hire a lawyer that he would collude with the rest of her as and not let her be discharged. She is very irritable and starts getting loud and cursing staff. However she has not been physically aggressive so far. She did take Invega 12 mg with some coaxing from the nurse today. Denies suicidal or homicidal thoughts.  11/13 No change from last week she still labile ranging from crying to yelling and cussing. Has no insight into what brought her to the hospital. Continues to be paranoid and delusional about people being  after her and spying on her. She still thinks she is being setup and that is why she is here in the hospital. She is argumentative during assessment. I recommended a mood stabilizer, after much encouragement patient agrees with taking Depakote.   11/14 Daughter says yesterday she was less labile and not as hostile towards here. She however is reported as still delusional hearing voices telecommunication trough the walkie talkies. Nurses picking on her. Here pt continues to fight and acuses staff of making fun of her, laughing at her not attending to her needs. She she yells at me this morning says that I had not listened to her she still thinks she is here because of what somebody else said to about her prior to admission. [name] impossible to have a conversation with her. She gets very loud and starts using profanity.    11/15 today the patient has been calmer, pleasant and cooperative. She was able to ask for things without yelling or screaming without being demanding or hostile. She has been pleasant to me and the rest of the staff working with her. She did not voice any delusional thinking or paranoia or suspiciousness towards us or her daughter.  Yesterday she took first dose of olanzapine, she received 15 mg at bedtime. Patient says she was not able to sleep very well last night and therefore she has been is sleeping up until late this morning. According that she denied having any side effects from  the olanzapine. She denied having any suicidality, homicidality or auditory or visual hallucinations. She complained of having leg pain which she thinks is secondary to Raynaud. She was requesting to be started back on Neurontin however I advised her against her as she is already on Depakote.   Per nursing: Patient with appropriate affect, cooperative behavior with meals, meds and plan of care. No SI/HI at this time. States she "did not sleep well last night" and returns to bed to sleep after am meal.  Therapy groups encouraged. Minimal interaction with peers. Verbalizing needs appropriately with staff at this time. Safety maintained. Completes daily audit sheet with focus on discharge plan and comply with treatment plan as goal.   Principal Problem: Bipolar 1 disorder, mixed, severe (HCC) Diagnosis:   Patient Active Problem List   Diagnosis Date Noted  . Alcohol use disorder, severe, dependence (HCC) [F10.20] 01/20/2016  . Alcohol withdrawal (HCC) [F10.239] 01/20/2016  . Cannabis use disorder, mild, abuse [F12.10] 01/20/2016  . Tobacco use disorder [F17.200] 01/20/2016  . HTN (hypertension) [I10] 01/20/2016  . Hypothyroidism [E03.9] 01/20/2016  . Opioid use disorder, moderate, dependence (HCC) [F11.20] 01/20/2016  . Bipolar 1 disorder, mixed, severe (HCC) [F31.63] 01/19/2016   Total Time spent with patient: 30 minutes  Past Psychiatric History: Patient denies past psychiatric history. Patient's mother states that patient has been diagnosed with anxiety. She denies any previous hospitalizations due to mental health reasons.  Past Medical History:  Past Medical History:  Diagnosis Date  . Anxiety   . Hx of heart valve insufficiency   . Hypertension   . Thyroid disease   . Urinary incontinence     Past Surgical History:  Procedure Laterality Date  . ANTERIOR FUSION CERVICAL SPINE     C5, C6  . BACK SURGERY    . TONSILLECTOMY    . TUBAL LIGATION     Family History: History reviewed. No pertinent family history.   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 Patient's mother does not know why she was fired. Patient has a daughter in [place] and a son. She is currently living with her parents and aunt.  Denies legal trouble.     History  Alcohol Use  . Yes    Comment: 3-4 times weekly      History  Drug Use  . Types: Marijuana    Social  History   Social History  . Marital status: Single    Spouse name: N/A  . Number of children: N/A  . Years of education: N/A   Social History Main Topics  . Smoking status: Current Every Day Smoker    Packs/day: 1.50    Types: Cigarettes  . Smokeless tobacco: Never Used  . Alcohol use Yes     Comment: 3-4 times weekly   . Drug use:     Types: Marijuana  . Sexual activity: No   Other Topics Concern  . None   Social History Narrative  . None   Additional Social History:    History of alcohol / drug use?: Yes Negative Consequences of Use: Financial, Personal relationships, Work / Programmer, multimedia     Current Medications: Current Facility-Administered Medications  Medication Dose Route Frequency Provider Last Rate Last Dose  . acetaminophen (TYLENOL) tablet 650 mg  650 mg Oral Q6H PRN Audery Amel, MD   650 mg at 02/01/16  1534  . alum & mag hydroxide-simeth (MAALOX/MYLANTA) 200-200-20 MG/5ML suspension 30 mL  30 mL Oral Q4H PRN Audery Amel, MD   30 mL at 01/31/16 1036  . aspirin EC tablet 81 mg  81 mg Oral Daily Audery Amel, MD   81 mg at 02/02/16 0830  . diphenhydrAMINE (BENADRYL) capsule 25 mg  25 mg Oral Q8H PRN Jimmy Footman, MD   25 mg at 02/01/16 0558  . diphenhydrAMINE (BENADRYL) capsule 25 mg  25 mg Oral QHS Jimmy Footman, MD   25 mg at 02/01/16 2118  . divalproex (DEPAKOTE ER) 24 hr tablet 1,500 mg  1,500 mg Oral QHS Jimmy Footman, MD   1,500 mg at 02/01/16 2118  . ibuprofen (ADVIL,MOTRIN) tablet 600 mg  600 mg Oral Q6H PRN Jimmy Footman, MD      . levothyroxine (SYNTHROID, LEVOTHROID) tablet 125 mcg  125 mcg Oral QAC breakfast Jimmy Footman, MD   125 mcg at 02/02/16 [phone]  . lidocaine (LIDODERM) 5 % 1 patch  1 patch Transdermal Q24H Audery Amel, MD   1 patch at 01/28/16 1729  . lisinopril (PRINIVIL,ZESTRIL) tablet 20 mg  20 mg Oral Daily Jimmy Footman, MD   20 mg at 02/02/16 0827  . LORazepam  (ATIVAN) tablet 0.5 mg  0.5 mg Oral TID PRN Jimmy Footman, MD   0.5 mg at 02/02/16 1010  . magnesium hydroxide (MILK OF MAGNESIA) suspension 30 mL  30 mL Oral Daily PRN Audery Amel, MD      . nicotine (NICODERM CQ - dosed in mg/24 hours) patch 21 mg  21 mg Transdermal Daily Jimmy Footman, MD   21 mg at 02/02/16 0830  . OLANZapine zydis (ZYPREXA) disintegrating tablet 20 mg  20 mg Oral QHS Jimmy Footman, MD      . simvastatin (ZOCOR) tablet 20 mg  20 mg Oral q1800 Audery Amel, MD   20 mg at 02/01/16 1809  . traZODone (DESYREL) tablet 100 mg  100 mg Oral QHS PRN Audery Amel, MD   100 mg at 02/01/16 2118  . vitamin B-12 (CYANOCOBALAMIN) tablet 1,000 mcg  1,000 mcg Oral Daily Jimmy Footman, MD   1,000 mcg at 02/02/16 0827    Lab Results:  No results found for this or any previous visit (from the past 48 hour(s)).  Blood Alcohol level:  Lab Results  Component Value Date   ETH <5 01/19/2016    Metabolic Disorder Labs: Lab Results  Component Value Date   HGBA1C 5.4 01/20/2016   MPG 108 01/20/2016   Lab Results  Component Value Date   PROLACTIN 54.9 (H) 01/20/2016   Lab Results  Component Value Date   CHOL 139 01/20/2016   TRIG 341 (H) 01/20/2016   HDL 32 (L) 01/20/2016   CHOLHDL 4.3 01/20/2016   VLDL 68 (H) 01/20/2016   LDLCALC 39 01/20/2016    Physical Findings: AIMS: Facial and Oral Movements Muscles of Facial Expression: None, normal Lips and Perioral Area: None, normal Jaw: None, normal Tongue: None, normal,Extremity Movements Upper (arms, wrists, hands, fingers): None, normal Lower (legs, knees, ankles, toes): None, normal, Trunk Movements Neck, shoulders, hips: None, normal, Overall Severity Severity of abnormal movements (highest score from questions above): None, normal Incapacitation due to abnormal movements: None, normal Patient's awareness of abnormal movements (rate only patient's report): No Awareness,  Dental Status Current problems with teeth and/or dentures?: No Does patient usually wear dentures?: No  CIWA:  CIWA-Ar Total: 1 COWS:  Musculoskeletal: Strength & Muscle Tone: within normal limits Gait & Station: normal Patient leans: N/A  Psychiatric Specialty Exam: Physical Exam  Nursing note and vitals reviewed. Constitutional: She appears well-developed and well-nourished.  HENT:  Head: Normocephalic and atraumatic.  Eyes: Conjunctivae are normal. Pupils are equal, round, and reactive to Marlar.  Neck: Normal range of motion.  Cardiovascular: Normal heart sounds.   Respiratory: Effort normal. No respiratory distress.  GI: Soft.  Musculoskeletal: Normal range of motion.  Neurological: She is alert.  Skin: Skin is warm and dry.    Review of Systems  Constitutional: Negative.  Negative for malaise/fatigue.  HENT: Negative.   Eyes: Negative.   Respiratory: Negative.   Cardiovascular: Negative.   Gastrointestinal: Negative.   Genitourinary: Negative.   Musculoskeletal: Positive for back pain and joint pain.  Skin: Negative.   Neurological: Negative.   Psychiatric/Behavioral: Positive for depression and substance abuse. Negative for hallucinations, memory loss and suicidal ideas. The patient has insomnia. The patient is not nervous/anxious.   All other systems reviewed and are negative.   Blood pressure 125/84, pulse 71, temperature 98.2 F (36.8 C), temperature source Oral, resp. rate 16, height 5\' 2"  (1.575 m), weight 83 kg (182 lb 15.7 oz), SpO2 100 %.Body mass index is 33.47 kg/m.  General Appearance: Casual  Eye Contact:  Fair  Speech:  Clear and Coherent  Volume:  Normal  Mood:  Depressed, irritable   Affect:  Congruent  Thought Process:  Goal Directed  Orientation:  Full (Time, Place, and Person)  Thought Content:  Logical  Suicidal Thoughts:  No  Homicidal Thoughts:  No  Memory:  Immediate;   Good Recent;   Fair Remote;   Fair  Judgement:  Fair   Insight:  Lacking  Psychomotor Activity:  Normal  Concentration:  Concentration: Poor and Attention Span: Poor  Recall:  Poor  Fund of Knowledge:  Fair  Language:  Fair  Akathisia:  No  Handed:  Right  AIMS (if indicated):     Assets:  Communication Skills Desire for Improvement Physical Health Resilience  ADL's:  Intact  Cognition:  WNL  Sleep:  Number of Hours: 6.5     Treatment Plan Summary: Daily contact with patient to assess and evaluate symptoms and progress in treatment and Medication management   Minimal improvement since admission. She will be started on Depakote in addition to the 12 mg of Invega. I will try to schedule a family meeting with patient's daughter  [place], patient had come down to "help" her daughter who lives here and to give the daughter her car. The daughter kicked the patient out who went to stay in a motel. Police were called by the motel staff due to issues between the patient and their staff, the patient going into areas where she didn't belong and for "hearing voices."  R/o Bipolar disorder: Unclear diagnosis at this time. Patient does not have a prior psychiatric history has never received inpatient or outpatient treatment. Prior to admission with thoughts patient was taking Paxil. Per collateral information obtained from the daughter  the symptoms have been present for at least 30 days ([facility])  -Bipolar d/o: Patient continues to display significant hostility, and severe irritability. Continues to believe she was set up by her daughter and her daughter's boyfriend.  Invega  Increased to 12 mg qhs on 11/6.  Continues to be very reluctant about taking the higher dose of Invega. Despite her compliant with the higher  dose of Invega off over the last several days patient does not appear to be responding to this antipsychotic. We have discontinue the Invega and started olanzapine on 11/14.  Today on olanzapine will be  increased to 20 mg by mouth qhs  Started on depakote ER 1500 mg qhs--- we'll check Depakote level on Sunday night  -Irritability and agitation: Continue with Ativan 0.5 mg 3 times a day as needed.   -Alcohol use disorder: Patient's daughter report patient drinks heavily.  In need of substance abuse treatment upon discharge  - Alcohol withdrawal: resolved  -Hypertension:  continued with lisinopril 20 mg by mouth daily  -Dyslipidemia: continued with zocor daily  -Cardiovascular disease: Continue aspirin daily  -hypothyroid- Patient continued on levothyroxine  -History of chronic pain: Patient is currently receiving a Lidoderm patch apply to lower back, she has also order is for ibuprofen when necessary. She has past history of abusing narcotics.  -UTI: treated with fosfomycin  -B12 deficiency: continue B12 oral  -Tobacco use disorder: nicotine patch 21 mg  Precautions every 15 minute checks  Vital signs daily  Diet low sodium  Hospitalization status involuntary commitment--IVC hearing was held on 11/7  Labs: HIV -, RPR -, B12 low and ammonia level wnl.  HbA1c and lipid panel have been completed  Imaging:  MRI of the brain no acute process  Possible d/c in about 7 days by her primary team  Jimmy Footman, MD 02/02/2016, 11:39 AM

## 2016-02-02 NOTE — Progress Notes (Signed)
Recreation Therapy Notes  Date: 11.15.17 Time: 9:30 am Location: Craft Room  Group Topic: Self-esteem  Goal Area(s) Addresses:  Patient will write at least one positive trait about self. Patient will write at least one healthy coping skill.  Behavioral Response: Did not attend  Intervention: All About Me  Activity: Patients were instructed to make a pamphlet including their life's motto, positive traits, healthy coping skills, and their support system.  Education: LRT educated patients on ways they can increase their self-esteem.  Education Outcome: Patient did not attend group.  Clinical Observations/Feedback: Patient did not attend group.  Jacquelynn CreeGreene,Marciano Mundt M, LRT/CTRS 02/02/2016 10:26 AM

## 2016-02-02 NOTE — Progress Notes (Signed)
D: Observed pt in room sitting on bed. Patient alert and oriented x4. Patient denies SI/HI/AVH. Pt affect is anxious and irritable. Pt very pre-occupied with wanting discharge and being unhappy with her current doctor on the unit. Pt insisted that the only reason she was having issues prior to admission was insomnia due to her environment. Pt stated "I don't belong here.Marland Kitchen.Marland Kitchen.I really need to leave." Pt did get somewhat tearful when talking about missing her her family and pets in [place].  A: Offered active listening and support. Provided therapeutic communication. Administered scheduled medications. Encouraged pt to appropriately discuss concerns with her doctor and Child psychotherapistsocial worker.  R: Pt cooperative. Pt was irritable, but did not have any outburst this evening and did not direct her anger at staff. Pt medication compliant. Will continue Q15 min. checks. Safety maintained.

## 2016-02-02 NOTE — Progress Notes (Addendum)
Patient with appropriate affect, cooperative behavior with meals, meds and plan of care. No SI/HI at this time. States she "did not sleep well last night" and returns to bed to sleep after am meal. Therapy groups encouraged. Minimal interaction with peers. Verbalizing needs appropriately with staff at this time. Safety maintained. Completes daily audit sheet with focus on discharge plan and comply with treatment plan as goal. Patient affect increasingly pleasant and mood increasingly stable this afternoon. No angry outbursts or frustration noted. Patient increasingly social with peers. Noted to smile and sit and play cards with mixed group of peers. Positive support from Clinical research associatewriter and patient smiles and states expresses thanks and looks happy.

## 2016-02-02 NOTE — [facility] Group Notes (Signed)
[facility] LCSW Group Therapy  02/02/2016 1:49 PM  Type of Therapy:  Group Therapy  Participation Level:  Minimal  Participation Quality:  Attentive  Affect:  Appropriate  Cognitive:  Alert  Insight:  Limited  Engagement in Therapy:  Limited  Modes of Intervention:  Activity, Discussion, Education and Support  Summary of Progress/Problems:Emotional Regulation: Patients will identify both negative and positive emotions. They will discuss emotions they have difficulty regulating and how they impact their lives. Patients will be asked to identify healthy coping skills to combat unhealthy reactions to negative emotions. Patient would share with the group when prompted by the CSW.     Catherine Lester G. Garnette CzechSampson MSW, LCSWA 02/02/2016, 1:51 PM

## 2016-02-03 NOTE — [facility] Group Notes (Signed)
[facility] LCSW Group Therapy  02/03/2016 2:34 PM  Type of Therapy:  Group Therapy  Participation Level:  Minimal  Participation Quality:  Resistant  Affect:  Angry and Irritable  Cognitive:  Disorganized  Insight:  None  Engagement in Therapy:  None  Modes of Intervention:  Activity, Discussion, Education and Support  Summary of Progress/Problems:Balance in life: Patients will discuss the concept of balance and how it looks and feels to be unbalanced. Pt will identify areas in their life that is unbalanced and ways to become more balanced. CSW attempted to engage with patient at the beginniing of group. Patient was irritable and angry stating, "I am here for no reason, yall are talking with my family to keep me here". CSW provided support to patient. Patient then left group after a few minutes stating "I can't do this shit, you hear me?". Patient did not return to group.   Catherine Lester G. Garnette CzechSampson MSW, LCSWA 02/03/2016, 2:38 PM

## 2016-02-03 NOTE — Progress Notes (Signed)
[facility] MD Progress Note  02/03/2016 1:32 PM Shevawn Langenberg  MRN:  [id number]  Subjective:  Patient is a 50 year old female who reports to the ER by way of police after becoming agitated and acting bizarre at a local motel. Patient was a very poor historian due to thought blocking and delusion/confusion. Patient states that she came to [facility] from [place] to help her daughter. There was a dispute between the patient and her daughter and the daughter kicked her out of the house. Patient states that she went to a local motel where the employees started bullying her and not letting her sleep. She states that her daughter and her boyfriend told the employees to bully her. Patient states that she knows her family is here in the hospital admitted. She can hear them talking outside her door so she knows they are here. She also knows that there is an investigation going on. Patient is very agitated and states that she does not need to be here. She wants to go out and smoke. Patient has other delusions including that her phone and motel room was bugged by the police.  11/12 Patient continues to be paranoid and very irritable. Yesterday she was a little pleasant in the morning. However throughout the day she continues to ask the nursing staff about her discharge. This morning she does this clinician that everybody is colluding to not let her be discharged. She tells this clinician that even if she were to hire a lawyer that he would collude with the rest of her as and not let her be discharged. She is very irritable and starts getting loud and cursing staff. However she has not been physically aggressive so far. She did take Invega 12 mg with some coaxing from the nurse today. Denies suicidal or homicidal thoughts.  11/13 No change from last week she still labile ranging from crying to yelling and cussing. Has no insight into what brought her to the hospital. Continues to be paranoid and delusional about people being  after her and spying on her. She still thinks she is being setup and that is why she is here in the hospital. She is argumentative during assessment. I recommended a mood stabilizer, after much encouragement patient agrees with taking Depakote.   11/14 Daughter says yesterday she was less labile and not as hostile towards here. She however is reported as still delusional hearing voices telecommunication trough the walkie talkies. Nurses picking on her. Here pt continues to fight and acuses staff of making fun of her, laughing at her not attending to her needs. She she yells at me this morning says that I had not listened to her she still thinks she is here because of what somebody else said to about her prior to admission. Maisie Fus impossible to have a conversation with her. She gets very loud and starts using profanity.    11/15 today the patient has been calmer, pleasant and cooperative. She was able to ask for things without yelling or screaming without being demanding or hostile. She has been pleasant to me and the rest of the staff working with her. She did not voice any delusional thinking or paranoia or suspiciousness towards [place] or her daughter.  Yesterday she took first dose of olanzapine, she received 15 mg at bedtime. Patient says she was not able to sleep very well last night and therefore she has been is sleeping up until late this morning. According that she denied having any side effects from  the olanzapine. She denied having any suicidality, homicidality or auditory or visual hallucinations. She complained of having leg pain which she thinks is secondary to Raynaud. She was requesting to be started back on Neurontin however I advised her against her as she is already on Depakote.  11/16 patient is still reporting that nurses are making derogatory and insulting comments about her. She feels that the nurses are laughing at her at times. Appears continues to demand to be discharged. Continues to stay  there is absolutely no reason for her to need to be in the hospital. The patient was again hostile, demanding and irritable; however she actually appears  improved compared to my prior interactions with her. Yesterday she had a very good day where she was pleasant and cooperative. She was respectful to staff. She complied with medications. She was out of her room. She did not show any hostility towards any staff up until the evening. She continues to denied any mood symptoms or any psychotic symptoms. She is not reporting having any more side effects to medications. She denies physical complaints other than her issues with chronic pain. Slowly she appears to be improving.  Per nursing:  Observed pt in hallway talking with peer. Patient alert and oriented x4. Patient denies SI/HI/AVH. Pt affect anxious and labile. For most of the evening, pt was pleasant and appropriate. Pt was not nearly as fixated on leaving nor expressing negativity about staff. Pt stated her day was "great." Pt indicated she felt that the Zyprexa was "smoother" and working better than the [place]. Later in the evening when pt was in room, pt mentioned to writer "I'm still upset about our conversation earlier." Pt briefly alluded to a conversation she claims she had with Clinical research associatewriter wherein she was offended and upset. No such conversation occurred, and when write informed pt of this, pt accused other staff members of similar activity.   Principal Problem: Bipolar 1 disorder, mixed, severe (HCC) Diagnosis:   Patient Active Problem List   Diagnosis Date Noted  . Alcohol use disorder, severe, dependence (HCC) [F10.20] 01/20/2016  . Alcohol withdrawal (HCC) [F10.239] 01/20/2016  . Cannabis use disorder, mild, abuse [F12.10] 01/20/2016  . Tobacco use disorder [F17.200] 01/20/2016  . HTN (hypertension) [I10] 01/20/2016  . Hypothyroidism [E03.9] 01/20/2016  . Opioid use disorder, moderate, dependence (HCC) [F11.20] 01/20/2016  . Bipolar 1  disorder, mixed, severe (HCC) [F31.63] 01/19/2016   Total Time spent with patient: 30 minutes  Past Psychiatric History: Patient denies past psychiatric history. Patient's mother states that patient has been diagnosed with anxiety. She denies any previous hospitalizations due to mental health reasons.  Past Medical History:  Past Medical History:  Diagnosis Date  . Anxiety   . Hx of heart valve insufficiency   . Hypertension   . Thyroid disease   . Urinary incontinence     Past Surgical History:  Procedure Laterality Date  . ANTERIOR FUSION CERVICAL SPINE     C5, C6  . BACK SURGERY    . TONSILLECTOMY    . TUBAL LIGATION     Family History: History reviewed. No pertinent family history.   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 Patient's mother does not know why she was fired. Patient has a daughter in [place] and a son. She is currently living with her parents and aunt.  Denies legal trouble.  History  Alcohol Use  . Yes    Comment: 3-4 times weekly      History  Drug Use  . Types: Marijuana    Social History   Social History  . Marital status: Single    Spouse name: N/A  . Number of children: N/A  . Years of education: N/A   Social History Main Topics  . Smoking status: Current Every Day Smoker    Packs/day: 1.50    Types: Cigarettes  . Smokeless tobacco: Never Used  . Alcohol use Yes     Comment: 3-4 times weekly   . Drug use:     Types: Marijuana  . Sexual activity: No   Other Topics Concern  . None   Social History Narrative  . None   Additional Social History:    History of alcohol / drug use?: Yes Negative Consequences of Use: Financial, Personal relationships, Work / Programmer, multimediachool     Current Medications: Current Facility-Administered Medications  Medication Dose Route Frequency Provider Last Rate Last  Dose  . acetaminophen (TYLENOL) tablet 650 mg  650 mg Oral Q6H PRN Audery AmelJohn T Clapacs, MD   650 mg at 02/01/16 1534  . alum & mag hydroxide-simeth (MAALOX/MYLANTA) 200-200-20 MG/5ML suspension 30 mL  30 mL Oral Q4H PRN Audery AmelJohn T Clapacs, MD   30 mL at 01/31/16 1036  . aspirin EC tablet 81 mg  81 mg Oral Daily Audery AmelJohn T Clapacs, MD   81 mg at 02/03/16 0844  . diphenhydrAMINE (BENADRYL) capsule 25 mg  25 mg Oral Q8H PRN Jimmy FootmanAndrea Hernandez-Gonzalez, MD   25 mg at 02/03/16 1205  . diphenhydrAMINE (BENADRYL) capsule 25 mg  25 mg Oral QHS Jimmy FootmanAndrea Hernandez-Gonzalez, MD   25 mg at 02/02/16 2100  . divalproex (DEPAKOTE ER) 24 hr tablet 1,500 mg  1,500 mg Oral QHS Jimmy FootmanAndrea Hernandez-Gonzalez, MD   1,500 mg at 02/02/16 2100  . ibuprofen (ADVIL,MOTRIN) tablet 600 mg  600 mg Oral Q6H PRN Jimmy FootmanAndrea Hernandez-Gonzalez, MD   600 mg at 02/03/16 0846  . levothyroxine (SYNTHROID, LEVOTHROID) tablet 125 mcg  125 mcg Oral QAC breakfast Jimmy FootmanAndrea Hernandez-Gonzalez, MD   125 mcg at 02/03/16 [phone]  . lidocaine (LIDODERM) 5 % 1 patch  1 patch Transdermal Q24H Audery AmelJohn T Clapacs, MD   1 patch at 01/28/16 1729  . lisinopril (PRINIVIL,ZESTRIL) tablet 20 mg  20 mg Oral Daily Jimmy FootmanAndrea Hernandez-Gonzalez, MD   20 mg at 02/03/16 0844  . LORazepam (ATIVAN) tablet 0.5 mg  0.5 mg Oral TID PRN Jimmy FootmanAndrea Hernandez-Gonzalez, MD   0.5 mg at 02/03/16 0846  . magnesium hydroxide (MILK OF MAGNESIA) suspension 30 mL  30 mL Oral Daily PRN Audery AmelJohn T Clapacs, MD      . nicotine (NICODERM CQ - dosed in mg/24 hours) patch 21 mg  21 mg Transdermal Daily Jimmy FootmanAndrea Hernandez-Gonzalez, MD   21 mg at 02/03/16 0844  . OLANZapine zydis (ZYPREXA) disintegrating tablet 20 mg  20 mg Oral QHS Jimmy FootmanAndrea Hernandez-Gonzalez, MD   20 mg at 02/02/16 2101  . simvastatin (ZOCOR) tablet 20 mg  20 mg Oral q1800 Audery AmelJohn T Clapacs, MD   20 mg at 02/02/16 1652  . traZODone (DESYREL) tablet 100 mg  100 mg Oral QHS PRN Audery AmelJohn T Clapacs, MD   100 mg at 02/02/16 2059  . vitamin B-12 (CYANOCOBALAMIN) tablet 1,000  mcg  1,000 mcg Oral Daily Jimmy FootmanAndrea Hernandez-Gonzalez, MD   1,000 mcg at 02/03/16 0844    Lab Results:  No results found for this or  any previous visit (from the past 48 hour(s)).  Blood Alcohol level:  Lab Results  Component Value Date   ETH <5 01/19/2016    Metabolic Disorder Labs: Lab Results  Component Value Date   HGBA1C 5.4 01/20/2016   MPG 108 01/20/2016   Lab Results  Component Value Date   PROLACTIN 54.9 (H) 01/20/2016   Lab Results  Component Value Date   CHOL 139 01/20/2016   TRIG 341 (H) 01/20/2016   HDL 32 (L) 01/20/2016   CHOLHDL 4.3 01/20/2016   VLDL 68 (H) 01/20/2016   LDLCALC 39 01/20/2016    Physical Findings: AIMS: Facial and Oral Movements Muscles of Facial Expression: None, normal Lips and Perioral Area: None, normal Jaw: None, normal Tongue: None, normal,Extremity Movements Upper (arms, wrists, hands, fingers): None, normal Lower (legs, knees, ankles, toes): None, normal, Trunk Movements Neck, shoulders, hips: None, normal, Overall Severity Severity of abnormal movements (highest score from questions above): None, normal Incapacitation due to abnormal movements: None, normal Patient's awareness of abnormal movements (rate only patient's report): No Awareness, Dental Status Current problems with teeth and/or dentures?: No Does patient usually wear dentures?: No  CIWA:  CIWA-Ar Total: 1 COWS:     Musculoskeletal: Strength & Muscle Tone: within normal limits Gait & Station: normal Patient leans: N/A  Psychiatric Specialty Exam: Physical Exam  Nursing note and vitals reviewed. Constitutional: She appears well-developed and well-nourished.  HENT:  Head: Normocephalic and atraumatic.  Eyes: Conjunctivae are normal. Pupils are equal, round, and reactive to Cosner.  Neck: Normal range of motion.  Cardiovascular: Normal heart sounds.   Respiratory: Effort normal. No respiratory distress.  GI: Soft.  Musculoskeletal: Normal range of motion.   Neurological: She is alert.  Skin: Skin is warm and dry.    Review of Systems  Constitutional: Negative.   HENT: Negative.   Eyes: Negative.   Respiratory: Negative.   Cardiovascular: Negative.   Gastrointestinal: Negative.   Genitourinary: Negative.   Musculoskeletal: Positive for back pain and joint pain.  Skin: Negative.   Neurological: Negative.   Endo/Heme/Allergies: Negative.   Psychiatric/Behavioral: Positive for depression and substance abuse. Negative for hallucinations, memory loss and suicidal ideas. The patient has insomnia. The patient is not nervous/anxious.   All other systems reviewed and are negative.   Blood pressure 108/64, pulse 76, temperature 98.2 F (36.8 C), temperature source Oral, resp. rate 18, height 5\' 2"  (1.575 m), weight 83 kg (182 lb 15.7 oz), SpO2 100 %.Body mass index is 33.47 kg/m.  General Appearance: Casual  Eye Contact:  Fair  Speech:  Clear and Coherent  Volume:  Normal  Mood:  Depressed, irritable   Affect:  Congruent  Thought Process:  Goal Directed  Orientation:  Full (Time, Place, and Person)  Thought Content:  Logical  Suicidal Thoughts:  No  Homicidal Thoughts:  No  Memory:  Immediate;   Good Recent;   Fair Remote;   Fair  Judgement:  Fair  Insight:  Lacking  Psychomotor Activity:  Normal  Concentration:  Concentration: Poor and Attention Span: Poor  Recall:  Poor  Fund of Knowledge:  Fair  Language:  Fair  Akathisia:  No  Handed:  Right  AIMS (if indicated):     Assets:  Communication Skills Desire for Improvement Physical Health Resilience  ADL's:  Intact  Cognition:  WNL  Sleep:  Number of Hours: 7.25     Treatment Plan Summary: Daily contact with patient to assess and evaluate symptoms and progress in treatment and  Medication management   Minimal improvement since admission. She has been started on olanzapine and Depakote. Depakote level will be checked on Sunday. Zyprexa was just increased to 20 mg  yesterday. Patient appears to be tolerating medications well. Appears that this combination has been somewhat more effective than the combination of Depakote and Invega.   Per [employer] report, patient had come down to "help" her daughter who lives here and to give the daughter her car. The daughter kicked the patient out who went to stay in a motel. Police were called by the motel staff due to issues between the patient and their staff, the patient going into areas where she didn't belong and for "hearing voices."  R/o Bipolar disorder: Unclear diagnosis at this time. Patient does not have a prior psychiatric history has never received inpatient or outpatient treatment. Prior to admission with thoughts patient was taking Paxil. Per collateral information obtained from the daughter  the symptoms have been present for at least 30 days (since she came to [facility])  -Bipolar d/o: Patient continues to display significant hostility, and severe irritability. Continues to believe she was set up by her daughter and her daughter's boyfriend.  Invega  Increased to 12 mg qhs on 11/6.  Continues to be very reluctant about taking the higher dose of Invega. Despite her compliant with the higher dose of Invega off over the last several days patient does not appear to be responding to this antipsychotic. We have discontinue the Invega and started olanzapine on 11/14.  Olanzapine was increased yesterday to 20 mg at bedtime.  Started on depakote ER 1500 mg qhs--- we'll check Depakote level on Sunday night  -Irritability and agitation: Continue with Ativan 0.5 mg 3 times a day as needed.   -Alcohol use disorder: Patient's daughter report patient drinks heavily.  In need of substance abuse treatment upon discharge  - Alcohol withdrawal: resolved  -Hypertension:  continued with lisinopril 20 mg by mouth daily  -Dyslipidemia: continued with zocor daily  -Cardiovascular disease: Continue aspirin  daily  -hypothyroid- Patient continued on levothyroxine  -History of chronic pain: Patient is currently receiving a Lidoderm patch apply to lower back, she has also order is for ibuprofen when necessary. She has past history of abusing narcotics.  -UTI: treated with fosfomycin  -B12 deficiency: continue B12 oral  -Tobacco use disorder: nicotine patch 21 mg  Precautions every 15 minute checks  Vital signs daily  Diet low sodium  Hospitalization status involuntary commitment--IVC hearing was held on 11/7  Labs: HIV -, RPR -, B12 low and ammonia level wnl.  HbA1c and lipid panel have been completed  Imaging:  MRI of the brain no acute process  Possible d/c in about 5- 7 days   Jimmy Footman, MD 02/03/2016, 1:32 PM

## 2016-02-03 NOTE — Progress Notes (Signed)
D: Observed pt in hallway talking with peer. Patient alert and oriented x4. Patient denies SI/HI/AVH. Pt affect anxious and labile. For most of the evening, pt was pleasant and appropriate. Pt was not nearly as fixated on leaving nor expressing negativity about staff. Pt stated her day was "great." Pt indicated she felt that the Zyprexa was "smoother" and working better than the [place]. Later in the evening when pt was in room, pt mentioned to writer "I'm still upset about our conversation earlier." Pt briefly alluded to a conversation she claims she had with Clinical research associatewriter wherein she was offended and upset. No such conversation occurred, and when write informed pt of this, pt accused other staff members of similar activity.  A: Offered active listening and support. Provided therapeutic communication. Administered scheduled medications. Supported pt's improvement in mood and behavior this evening. R: Pt pleasant and cooperative. Pt medication compliant. Will continue Q15 min. checks. Safety maintained.

## 2016-02-03 NOTE — [facility] Group Notes (Signed)
[facility] Group Notes:  (Nursing/MHT/Case Management/Adjunct)  Date:  02/03/2016  Time:  5:11 AM  Type of Therapy:  Psychoeducational Skills  Participation Level:  Active  Participation Quality:  Appropriate and Sharing  Affect:  Appropriate  Cognitive:  Appropriate  Insight:  Appropriate and Good  Engagement in Group:  Engaged  Modes of Intervention:  Discussion, Socialization and Support  Summary of Progress/Problems:  Catherine MilroyLaquanda Y Jaylissa Lester 02/03/2016, 5:11 AM

## 2016-02-03 NOTE — Progress Notes (Signed)
Patients mood is irritated most of the day.Patient states "people give me false promises to discharge me.I don't need to be here."Patient stated that she is going to hire a lawyer for her to get discharge.Compliant with medications.Attended groups.Patient denies SI,HI and AVH.Patient is reluctant to listen to staff.

## 2016-02-03 NOTE — Plan of Care (Signed)
Problem: Safety: Goal: Ability to redirect hostility and anger into socially appropriate behaviors will improve Outcome: Progressing Pt expressed no hostility this evening and was rather pleasant and appropriate.

## 2016-02-03 NOTE — Progress Notes (Signed)
Recreation Therapy Notes  Date: 11.16.17 Time: 9:30 am Location: Craft Room  Group Topic: Coping Skills/Leisure Education  Goal Area(s) Addresses:  Patient will identify things they are grateful for. Patient will identify how being grateful can influence decision making.  Behavioral Response: Attentive, Interactive  Intervention: Grateful Wheel  Activity: Patients were given an I Am Grateful For worksheet and were instructed to write things they were grateful for under each category.  Education: LRT educated patient on why it is important to be grateful.  Education Outcome: Acknowledges education/In group clarification offered  Clinical Observations/Feedback: Patient wrote things she is grateful for. Patient contributed to group discussion by stating things she is grateful for and what category has more things she is grateful for in it.  Jacquelynn CreeGreene,Yolunda Kloos M, LRT/CTRS 02/03/2016 10:13 AM

## 2016-02-04 MED ORDER — OLANZAPINE 5 MG PO TBDP
30.0000 mg | ORAL_TABLET | Freq: Every day | ORAL | Status: DC
  Administered 2016-02-04 – 2016-02-06 (×3): 30 mg via ORAL
  Filled 2016-02-04 (×3): qty 6

## 2016-02-04 NOTE — Progress Notes (Signed)
D: Pt denies SI/HI/AVH. Pt is pleasant and cooperative with treatment plan and she is less irritable. Patient's thoughts are less disorganized. Pt appears less anxious and she is interacting with peers and staff appropriately.  A: Pt was offered support and encouragement. Pt was given scheduled medications. Pt was encouraged to attend groups. Q 15 minute checks were done for safety.  R:Pt attends groups and interacts well with peers and staff. Pt is taking medication. Pt has no complaints.Pt receptive to treatment and safety maintained on unit.

## 2016-02-04 NOTE — [facility] Group Notes (Signed)
[facility] LCSW Group Therapy  02/04/2016 3:36 PM  Type of Therapy:  Group Therapy  Participation Level:  Minimal  Participation Quality:  Attentive  Affect:  Appropriate  Cognitive:  Alert  Insight:  None  Engagement in Therapy:  Engaged  Modes of Intervention:  Activity, Discussion, Education and Support  Summary of Progress/Problems:Feelings around Relapse. Group members discussed the meaning of relapse and shared personal stories of relapse, how it affected them and others, and how they perceived themselves during this time. Group members were encouraged to identify triggers, warning signs and coping skills used when facing the possibility of relapse. Social supports were discussed and explored in detail. Patients also discussed facing disappointment and how that can trigger someone to relapse. Patient was able to appropriately participate in the group coloring activity and stayed in group the entire time.    Aggie Douse G. Garnette CzechSampson MSW, LCSWA 02/04/2016, 3:39 PM

## 2016-02-04 NOTE — Progress Notes (Signed)
Recreation Therapy Notes  Date: 11.17.17 Time: 9:30 am Location: Craft Room  Group Topic: Stress Management  Goal Area(s) Addresses:  Patient will participate in stress management techniques. Patient will verbalize benefit of using stress management techniques.  Behavioral Response: Irritable, Left early  Intervention: Relaxation Techniques  Activity: Patients were given Stress Management techniques. LRT educated patients on stress management techniques and had patients practice the techniques.  Education: LRT educated patients on the importance of using the stress management techniques.  Education Outcome: Patient left before LRT educated group.  Clinical Observations/Feedback: Patient was upset that she was called to group when she wanted to nap. LRT informed patient she could go back to her room to rest if she would like. Patient left group at approximately 9:44 am and did not return to group.  Jacquelynn CreeGreene,Koryn Charlot M, LRT/CTRS 02/04/2016 10:18 AM

## 2016-02-04 NOTE — Progress Notes (Signed)
Patient is less irritable today but still demanding for discharge.States "I am extremely  frustrated here.I don't have nothing to do.These groups are low grade level."Compliant with medications.Attended groups.Rated her depression 0/10 and anxiety 10/10.Denies suicidal or homicidal ideations and AV hallucinations.Appetite & energy level good.Support & encouragement given.

## 2016-02-04 NOTE — Tx Team (Signed)
Interdisciplinary Treatment and Diagnostic Plan Update  02/04/2016 Time of Session: 11:20am Catherine Lester MRN: [id number]  Principal Diagnosis: Bipolar 1 disorder, mixed, severe (HCC)  Secondary Diagnoses: Principal Problem:   Bipolar 1 disorder, mixed, severe (HCC) Active Problems:   Alcohol use disorder, severe, dependence (HCC)   Alcohol withdrawal (HCC)   Cannabis use disorder, mild, abuse   Tobacco use disorder   HTN (hypertension)   Hypothyroidism   Opioid use disorder, moderate, dependence (HCC)   Current Medications:  Current Facility-Administered Medications  Medication Dose Route Frequency Provider Last Rate Last Dose  . acetaminophen (TYLENOL) tablet 650 mg  650 mg Oral Q6H PRN Audery Amel, MD   650 mg at 02/01/16 1534  . alum & mag hydroxide-simeth (MAALOX/MYLANTA) 200-200-20 MG/5ML suspension 30 mL  30 mL Oral Q4H PRN Audery Amel, MD   30 mL at 01/31/16 1036  . aspirin EC tablet 81 mg  81 mg Oral Daily Audery Amel, MD   81 mg at 02/04/16 4098  . diphenhydrAMINE (BENADRYL) capsule 25 mg  25 mg Oral Q8H PRN Jimmy Footman, MD   25 mg at 02/03/16 1205  . diphenhydrAMINE (BENADRYL) capsule 25 mg  25 mg Oral QHS Jimmy Footman, MD   25 mg at 02/03/16 2148  . divalproex (DEPAKOTE ER) 24 hr tablet 1,500 mg  1,500 mg Oral QHS Jimmy Footman, MD   1,500 mg at 02/03/16 2150  . ibuprofen (ADVIL,MOTRIN) tablet 600 mg  600 mg Oral Q6H PRN Jimmy Footman, MD   600 mg at 02/04/16 0842  . levothyroxine (SYNTHROID, LEVOTHROID) tablet 125 mcg  125 mcg Oral QAC breakfast Jimmy Footman, MD   125 mcg at 02/04/16 0650  . lidocaine (LIDODERM) 5 % 1 patch  1 patch Transdermal Q24H Audery Amel, MD   1 patch at 01/28/16 1729  . lisinopril (PRINIVIL,ZESTRIL) tablet 20 mg  20 mg Oral Daily Jimmy Footman, MD   20 mg at 02/04/16 0811  . LORazepam (ATIVAN) tablet 0.5 mg  0.5 mg Oral TID PRN Jimmy Footman, MD    0.5 mg at 02/04/16 0842  . magnesium hydroxide (MILK OF MAGNESIA) suspension 30 mL  30 mL Oral Daily PRN Audery Amel, MD      . nicotine (NICODERM CQ - dosed in mg/24 hours) patch 21 mg  21 mg Transdermal Daily Jimmy Footman, MD   21 mg at 02/04/16 0811  . OLANZapine zydis (ZYPREXA) disintegrating tablet 30 mg  30 mg Oral QHS Jimmy Footman, MD      . simvastatin (ZOCOR) tablet 20 mg  20 mg Oral q1800 Audery Amel, MD   20 mg at 02/03/16 1700  . traZODone (DESYREL) tablet 100 mg  100 mg Oral QHS PRN Audery Amel, MD   100 mg at 02/03/16 2148  . vitamin B-12 (CYANOCOBALAMIN) tablet 1,000 mcg  1,000 mcg Oral Daily Jimmy Footman, MD   1,000 mcg at 02/04/16 1191   PTA Medications: Prescriptions Prior to Admission  Medication Sig Dispense Refill Last Dose  . amoxicillin (AMOXIL) 875 MG tablet Take 875 mg by mouth 2 (two) times daily.     Marland Kitchen aspirin 81 MG chewable tablet Chew by mouth daily.     . fluticasone (FLONASE) 50 MCG/ACT nasal spray Place into both nostrils daily.     Marland Kitchen gabapentin (NEURONTIN) 300 MG capsule Take 300 mg by mouth daily.     Marland Kitchen ibuprofen (ADVIL,MOTRIN) 600 MG tablet Take 600 mg by mouth.     Marland Kitchen  levothyroxine (SYNTHROID, LEVOTHROID) 125 MCG tablet Take 125 mcg by mouth daily before breakfast.     . lisinopril (PRINIVIL,ZESTRIL) 20 MG tablet Take 20 mg by mouth daily.     . Multiple Vitamins-Minerals (MULTIVITAMIN WITH MINERALS) tablet Take 1 tablet by mouth daily.     Marland Kitchen. NIFEdipine (PROCARDIA-XL/ADALAT CC) 30 MG 24 hr tablet Take 30 mg by mouth daily.     Marland Kitchen. NIFEdipine (PROCARDIA-XL/ADALAT-CC/NIFEDICAL-XL) 30 MG 24 hr tablet Take 30 mg by mouth daily.     . Probiotic Product (PROBIOTIC ACIDOPHILUS BEADS PO) Take by mouth.     . Pumpkin Seed-Soy Germ (AZO BLADDER CONTROL/GO-LESS PO) Take by mouth.     . traMADol (ULTRAM) 50 MG tablet Take by mouth every 6 (six) hours as needed.       Patient Stressors: Financial difficulties Marital or  family conflict Medication change or noncompliance Substance abuse  Patient Strengths: Manufacturing systems engineerCommunication skills Supportive family/friends  Treatment Modalities: Medication Management, Group therapy, Case management,  1 to 1 session with clinician, Psychoeducation, Recreational therapy.   Physician Treatment Plan for Primary Diagnosis: Bipolar 1 disorder, mixed, severe (HCC) Long Term Goal(s): Improvement in symptoms so as ready for discharge Improvement in symptoms so as ready for discharge   Short Term Goals: Ability to identify changes in lifestyle to reduce recurrence of condition will improve Ability to verbalize feelings will improve Ability to demonstrate self-control will improve Ability to identify and develop effective coping behaviors will improve Compliance with prescribed medications will improve Ability to identify triggers associated with substance abuse/mental health issues will improve Ability to identify changes in lifestyle to reduce recurrence of condition will improve Ability to verbalize feelings will improve Ability to demonstrate self-control will improve Ability to identify and develop effective coping behaviors will improve Ability to identify triggers associated with substance abuse/mental health issues will improve  Medication Management: Evaluate patient's response, side effects, and tolerance of medication regimen.  Therapeutic Interventions: 1 to 1 sessions, Unit Group sessions and Medication administration.  Evaluation of Outcomes: Progressing  Physician Treatment Plan for Secondary Diagnosis: Principal Problem:   Bipolar 1 disorder, mixed, severe (HCC) Active Problems:   Alcohol use disorder, severe, dependence (HCC)   Alcohol withdrawal (HCC)   Cannabis use disorder, mild, abuse   Tobacco use disorder   HTN (hypertension)   Hypothyroidism   Opioid use disorder, moderate, dependence (HCC)  Long Term Goal(s): Improvement in symptoms so as ready  for discharge Improvement in symptoms so as ready for discharge   Short Term Goals: Ability to identify changes in lifestyle to reduce recurrence of condition will improve Ability to verbalize feelings will improve Ability to demonstrate self-control will improve Ability to identify and develop effective coping behaviors will improve Compliance with prescribed medications will improve Ability to identify triggers associated with substance abuse/mental health issues will improve Ability to identify changes in lifestyle to reduce recurrence of condition will improve Ability to verbalize feelings will improve Ability to demonstrate self-control will improve Ability to identify and develop effective coping behaviors will improve Ability to identify triggers associated with substance abuse/mental health issues will improve     Medication Management: Evaluate patient's response, side effects, and tolerance of medication regimen.  Therapeutic Interventions: 1 to 1 sessions, Unit Group sessions and Medication administration.  Evaluation of Outcomes: Progressing   RN Treatment Plan for Primary Diagnosis: Bipolar 1 disorder, mixed, severe (HCC) Long Term Goal(s): Knowledge of disease and therapeutic regimen to maintain health will improve  Short Term Goals: Ability  to remain free from injury will improve, Ability to verbalize frustration and anger appropriately will improve, Ability to demonstrate self-control and Ability to identify and develop effective coping behaviors will improve  Medication Management: RN will administer medications as ordered by provider, will assess and evaluate patient's response and provide education to patient for prescribed medication. RN will report any adverse and/or side effects to prescribing provider.  Therapeutic Interventions: 1 on 1 counseling sessions, Psychoeducation, Medication administration, Evaluate responses to treatment, Monitor vital signs and CBGs as  ordered, Perform/monitor CIWA, COWS, AIMS and Fall Risk screenings as ordered, Perform wound care treatments as ordered.  Evaluation of Outcomes: Progressing   LCSW Treatment Plan for Primary Diagnosis: Bipolar 1 disorder, mixed, severe (HCC) Long Term Goal(s): Safe transition to appropriate next level of care at discharge, Engage patient in therapeutic group addressing interpersonal concerns.  Short Term Goals: Engage patient in aftercare planning with referrals and resources, Increase social support, Increase emotional regulation, Identify triggers associated with mental health/substance abuse issues and Increase skills for wellness and recovery  Therapeutic Interventions: Assess for all discharge needs, 1 to 1 time with Social worker, Explore available resources and support systems, Assess for adequacy in community support network, Educate family and significant other(s) on suicide prevention, Complete Psychosocial Assessment, Interpersonal group therapy.  Evaluation of Outcomes: Progressing   Progress in Treatment: Attending groups: Yes. Participating in groups: Yes. Taking medication as prescribed: Yes. Toleration medication: Yes. Family/Significant other contact made: Yes, individual(s) contacted:  daughter Patient understands diagnosis: Yes. Discussing patient identified problems/goals with staff: Yes. Medical problems stabilized or resolved: Yes. Denies suicidal/homicidal ideation: Yes. Issues/concerns per patient self-inventory: No. Other: n/a  New problem(s) identified: None identified at this time.   New Short Term/Long Term Goal(s): None identified at this time.   Discharge Plan or Barriers: Patient will be referred to outpatient services.   Reason for Continuation of Hospitalization: Depression Hallucinations  Estimated Length of Stay: 3 to 5 days.   Attendees: Patient:Catherine Lester 02/04/2016 3:39 PM  Physician: Dr. Radene JourneyAndrea HernandezJayme Cloud- Gonzalez, MD 02/04/2016  3:39 PM  Nursing: Catherine MediaJanet Jones, RN 02/04/2016 3:39 PM  RN Care Manager: 02/04/2016 3:39 PM  Social Worker: Catherine Lester MSW, LCSWA 02/04/2016 3:39 PM  Recreational Therapist: Jacquelynn CreeElizabeth M. Greene, LRT/CTRS 02/04/2016 3:39 PM  Other:  02/04/2016 3:39 PM  Other:  02/04/2016 3:39 PM  Other: 02/04/2016 3:39 PM    Scribe for Treatment Team: Arelia LongestAmaris G Maryon Kemnitz, LCSWA 02/04/2016 3:43 PM

## 2016-02-04 NOTE — Progress Notes (Signed)
[facility]  02/04/2016 10:14 AM Catherine Lester  MRN:  [id number]  Subjective:  Patient is a 50 year old female who reports to the ER by way of police after becoming agitated and acting bizarre at a local motel. Patient was a very poor historian due to thought blocking and delusion/confusion. Patient states that [facility] [place] to help her daughter. There was a dispute between the patient and her daughter and the daughter kicked her out of the house. Patient states that she went to a local motel where the employees started bullying her and not letting her sleep. She states that her daughter and her boyfriend told the employees to bully her. Patient states that she knows her family is here in the hospital admitted. She can hear them talking outside her door so she knows they are here. She also knows that there is an investigation going on. Patient is very agitated and states that she does not need to be here. She wants to go out and smoke. Patient has other delusions including that her phone and motel room was bugged by the police.  11/12 Patient continues to be paranoid and very irritable. Yesterday she was a little pleasant in the morning. However throughout the day she continues to ask the nursing staff about her discharge. This morning she does this clinician that everybody is colluding to not let her be discharged. She tells this clinician that even if she were to hire a lawyer that he would collude with the rest of her as and not let her be discharged. She is very irritable and starts getting loud and cursing staff. However she has not been physically aggressive so far. She did take Invega 12 mg with some coaxing from the nurse today. Denies suicidal or homicidal thoughts.  11/13 No change from last week she still labile ranging from crying to yelling and cussing. Has no insight into what brought her to the hospital. Continues to be paranoid and delusional about people being  after her and spying on her. She still thinks she is being setup and that is why she is here in the hospital. She is argumentative during assessment. I recommended a mood stabilizer, after much encouragement patient agrees with taking Depakote.   11/14 Daughter says yesterday she was less labile and not as hostile towards here. She however is reported as still delusional hearing voices telecommunication trough the walkie talkies. Nurses picking on her. Here pt continues to fight and acuses staff of making fun of her, laughing at her not attending to her needs. She she yells at me this morning says that I had not listened to her she still thinks she is here because of what somebody else said to about her prior to admission. Catherine Lester impossible to have a conversation with her. She gets very loud and starts using profanity.    11/15 today the patient has been calmer, pleasant and cooperative. She was able to ask for things without yelling or screaming without being demanding or hostile. She has been pleasant to me and the rest of the staff working with her. She did not voice any delusional thinking or paranoia or suspiciousness towards Catherine Lester or her daughter.  Yesterday she took first dose of olanzapine, she received 15 mg at bedtime. Patient says she was not able to sleep very well last night and therefore she has been is sleeping up until late this morning. According that she denied having any side effects from  the olanzapine. She denied having any suicidality, homicidality or auditory or visual hallucinations. She complained of having leg pain which she thinks is secondary to Raynaud. She was requesting to be started back on Neurontin however I advised her against her as she is already on Depakote.  11/16 patient is still reporting that nurses are making derogatory and insulting comments about her. She feels that the nurses are laughing at her at times. Appears continues to demand to be discharged. Continues to stay  there is absolutely no reason for her to need to be in the hospital. The patient was again hostile, demanding and irritable; however she actually appears  improved compared to my prior interactions with her. Yesterday she had a very good day where she was pleasant and cooperative. She was respectful to staff. She complied with medications. She was out of her room. She did not show any hostility towards any staff up until the evening. She continues to denied any mood symptoms or any psychotic symptoms. She is not reporting having any more side effects to medications. She denies physical complaints other than her issues with chronic pain. Slowly she appears to be improving.  11/17 today once again she is hostile, irritable and argumentative. However her irritability and is less severe than he was before. The patient was somewhat more redirectable. And the content of her comments did not appear to be delusional. Patient has been attending groups. She has not been disruptive in groups. Patient says she is not sleeping well at night. However she denies having side effects to it she specializes Zyprexa however she tells me she does not plan to take the Zyprexa and Depakote after discharge. She demanding discharge again today. As far as physical complaints he continues to complain of having pain all over.  Per nursing: 11/16  Pt denies SI/HI/AVH. Pt is pleasant and cooperative with treatment plan and she is less irritable. Patient's thoughts are less disorganized. Pt appears less anxious and she is interacting with peers and staff appropriately.   11/15 Observed pt in hallway talking with peer. Patient alert and oriented x4. Patient denies SI/HI/AVH. Pt affect anxious and labile. For most of the evening, pt was pleasant and appropriate. Pt was not nearly as fixated on leaving nor expressing negativity about staff. Pt stated her day was "great." Pt indicated she felt that the Zyprexa was "smoother" and working better  than the [place]. Later in the evening when pt was in room, pt mentioned to writer "I'm still upset about our conversation earlier." Pt briefly alluded to a conversation she claims she had with Clinical research associate wherein she was offended and upset. No such conversation occurred, and when write informed pt of this, pt accused other staff members of similar activity.   Principal Problem: Bipolar 1 disorder, mixed, severe (HCC) Diagnosis:   Patient Active Problem List   Diagnosis Date Noted  . Alcohol use disorder, severe, dependence (HCC) [F10.20] 01/20/2016  . Alcohol withdrawal (HCC) [F10.239] 01/20/2016  . Cannabis use disorder, mild, abuse [F12.10] 01/20/2016  . Tobacco use disorder [F17.200] 01/20/2016  . HTN (hypertension) [I10] 01/20/2016  . Hypothyroidism [E03.9] 01/20/2016  . Opioid use disorder, moderate, dependence (HCC) [F11.20] 01/20/2016  . Bipolar 1 disorder, mixed, severe (HCC) [F31.63] 01/19/2016   Total Time spent with patient: 30 minutes  Past Psychiatric History: Patient denies past psychiatric history. Patient's mother states that patient has been diagnosed with anxiety. She denies any previous hospitalizations due to mental health reasons.  Past Medical History:  Past Medical History:  Diagnosis Date  . Anxiety   . Hx of heart valve insufficiency   . Hypertension   . Thyroid disease   . Urinary incontinence     Past Surgical History:  Procedure Laterality Date  . ANTERIOR FUSION CERVICAL SPINE     C5, C6  . BACK SURGERY    . TONSILLECTOMY    . TUBAL LIGATION     Family History: History reviewed. No pertinent family history.   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 Patient's mother does not know why she was fired. Patient has a daughter in [place] and a son. She is currently living with her parents and aunt.  Denies legal trouble.      History  Alcohol Use  . Yes    Comment: 3-4 times weekly      History  Drug Use  . Types: Marijuana    Social History   Social History  . Marital status: Single    Spouse name: N/A  . Number of children: N/A  . Years of education: N/A   Social History Main Topics  . Smoking status: Current Every Day Smoker    Packs/day: 1.50    Types: Cigarettes  . Smokeless tobacco: Never Used  . Alcohol use Yes     Comment: 3-4 times weekly   . Drug use:     Types: Marijuana  . Sexual activity: No   Other Topics Concern  . None   Social History Narrative  . None   Additional Social History:    History of alcohol / drug use?: Yes Negative Consequences of Use: Financial, Personal relationships, Work / Programmer, multimediachool     Current Medications: Current Facility-Administered Medications  Medication Dose Route Frequency Provider Last Rate Last Dose  . acetaminophen (TYLENOL) tablet 650 mg  650 mg Oral Q6H PRN Audery AmelJohn T Clapacs, MD   650 mg at 02/01/16 1534  . alum & mag hydroxide-simeth (MAALOX/MYLANTA) 200-200-20 MG/5ML suspension 30 mL  30 mL Oral Q4H PRN Audery AmelJohn T Clapacs, MD   30 mL at 01/31/16 1036  . aspirin EC tablet 81 mg  81 mg Oral Daily Audery AmelJohn T Clapacs, MD   81 mg at 02/04/16 [id number]  . diphenhydrAMINE (BENADRYL) capsule 25 mg  25 mg Oral Q8H PRN Jimmy FootmanAndrea Hernandez-Gonzalez, MD   25 mg at 02/03/16 1205  . diphenhydrAMINE (BENADRYL) capsule 25 mg  25 mg Oral QHS Jimmy FootmanAndrea Hernandez-Gonzalez, MD   25 mg at 02/03/16 2148  . divalproex (DEPAKOTE ER) 24 hr tablet 1,500 mg  1,500 mg Oral QHS Jimmy FootmanAndrea Hernandez-Gonzalez, MD   1,500 mg at 02/03/16 2150  . ibuprofen (ADVIL,MOTRIN) tablet 600 mg  600 mg Oral Q6H PRN Jimmy FootmanAndrea Hernandez-Gonzalez, MD   600 mg at 02/04/16 0842  . levothyroxine (SYNTHROID, LEVOTHROID) tablet 125 mcg  125 mcg Oral QAC breakfast Jimmy FootmanAndrea Hernandez-Gonzalez, MD   125 mcg at 02/04/16 0650  . lidocaine (LIDODERM) 5 % 1 patch  1 patch Transdermal Q24H Audery AmelJohn T Clapacs, MD   1 patch at  01/28/16 1729  . lisinopril (PRINIVIL,ZESTRIL) tablet 20 mg  20 mg Oral Daily Jimmy FootmanAndrea Hernandez-Gonzalez, MD   20 mg at 02/04/16 0811  . LORazepam (ATIVAN) tablet 0.5 mg  0.5 mg Oral TID PRN Jimmy FootmanAndrea Hernandez-Gonzalez, MD   0.5 mg at 02/04/16 0842  . magnesium hydroxide (MILK OF MAGNESIA) suspension 30  mL  30 mL Oral Daily PRN Audery Amel, MD      . nicotine (NICODERM CQ - dosed in mg/24 hours) patch 21 mg  21 mg Transdermal Daily Jimmy Footman, MD   21 mg at 02/04/16 0811  . OLANZapine zydis (ZYPREXA) disintegrating tablet 20 mg  20 mg Oral QHS Jimmy Footman, MD   20 mg at 02/03/16 2148  . simvastatin (ZOCOR) tablet 20 mg  20 mg Oral q1800 Audery Amel, MD   20 mg at 02/03/16 1700  . traZODone (DESYREL) tablet 100 mg  100 mg Oral QHS PRN Audery Amel, MD   100 mg at 02/03/16 2148  . vitamin B-12 (CYANOCOBALAMIN) tablet 1,000 mcg  1,000 mcg Oral Daily Jimmy Footman, MD   1,000 mcg at 02/04/16 1610    Lab Results:  No results found for this or any previous visit (from the past 48 hour(s)).  Blood Alcohol level:  Lab Results  Component Value Date   ETH <5 01/19/2016    Metabolic Disorder Labs: Lab Results  Component Value Date   HGBA1C 5.4 01/20/2016   MPG 108 01/20/2016   Lab Results  Component Value Date   PROLACTIN 54.9 (H) 01/20/2016   Lab Results  Component Value Date   CHOL 139 01/20/2016   TRIG 341 (H) 01/20/2016   HDL 32 (L) 01/20/2016   CHOLHDL 4.3 01/20/2016   VLDL 68 (H) 01/20/2016   LDLCALC 39 01/20/2016    Physical Findings: AIMS: Facial and Oral Movements Muscles of Facial Expression: None, normal Lips and Perioral Area: None, normal Jaw: None, normal Tongue: None, normal,Extremity Movements Upper (arms, wrists, hands, fingers): None, normal Lower (legs, knees, ankles, toes): None, normal, Trunk Movements Neck, shoulders, hips: None, normal, Overall Severity Severity of abnormal movements (highest score from  questions above): None, normal Incapacitation due to abnormal movements: None, normal Patient's awareness of abnormal movements (rate only patient's report): No Awareness, Dental Status Current problems with teeth and/or dentures?: No Does patient usually wear dentures?: No  CIWA:  CIWA-Ar Total: 1 COWS:     Musculoskeletal: Strength & Muscle Tone: within normal limits Gait & Station: normal Patient leans: N/A  Psychiatric Specialty Exam: Physical Exam  Nursing note and vitals reviewed. Constitutional: She appears well-developed and well-nourished.  HENT:  Head: Normocephalic and atraumatic.  Eyes: Conjunctivae are normal. Pupils are equal, round, and reactive to [place].  Neck: Normal range of motion.  Cardiovascular: Normal heart sounds.   Respiratory: Effort normal. No respiratory distress.  GI: Soft.  Musculoskeletal: Normal range of motion.  Neurological: She is alert.  Skin: Skin is warm and dry.    Review of Systems  Constitutional: Negative.   HENT: Negative.   Eyes: Negative.   Respiratory: Negative.   Cardiovascular: Negative.   Gastrointestinal: Negative.   Genitourinary: Negative.   Musculoskeletal: Positive for back pain and joint pain.  Skin: Negative.   Neurological: Negative.   Endo/Heme/Allergies: Negative.   Psychiatric/Behavioral: Positive for depression and substance abuse. Negative for hallucinations, memory loss and suicidal ideas. The patient has insomnia. The patient is not nervous/anxious.   All other systems reviewed and are negative.   Blood pressure 113/67, pulse 80, temperature 97.9 F (36.6 C), temperature source Oral, resp. rate 20, height 5\' 2"  (1.575 m), weight 83 kg (182 lb 15.7 oz), SpO2 100 %.Body mass index is 33.47 kg/m.  General Appearance: Casual  Eye Contact:  Fair  Speech:  Clear and Coherent  Volume:  Normal  Mood:  Depressed, irritable   Affect:  Congruent  Thought Process:  Goal Directed  Orientation:  Full (Time, Place,  and Person)  Thought Content:  Logical  Suicidal Thoughts:  No  Homicidal Thoughts:  No  Memory:  Immediate;   Good Recent;   Fair Remote;   Fair  Judgement:  Fair  Insight:  Lacking  Psychomotor Activity:  Normal  Concentration:  Concentration: Poor and Attention Span: Poor  Recall:  Poor  Fund of Knowledge:  Fair  Language:  Fair  Akathisia:  No  Handed:  Right  AIMS (if indicated):     Assets:  Communication Skills Desire for Improvement Physical Health Resilience  ADL's:  Intact  Cognition:  WNL  Sleep:  Number of Hours: 6.75     Treatment Plan Summary: Daily contact with patient to assess and evaluate symptoms and progress in treatment and Medication management   Minimal improvement since admission. She has been started on olanzapine and Depakote. Depakote level will be checked on Sunday. Zyprexa was just increased to 20 mg yesterday. Patient appears to be tolerating medications well. Appears that this combination has been somewhat more effective than the combination of Depakote and Invega.   Per [employer] report, patient had come down to "help" her daughter who lives here and to give the daughter her car. The daughter kicked the patient out who went to stay in a motel. Police were called by the motel staff due to issues between the patient and their staff, the patient going into areas where she didn't belong and for "hearing voices."  R/o Bipolar disorder: Unclear diagnosis at this time. Patient does not have a prior psychiatric history has never received inpatient or outpatient treatment. Prior to admission with thoughts patient was taking Paxil. Per collateral information obtained from the daughter  the symptoms have been present for at least 30 days (since she came to [facility])  -Bipolar d/o: Patient continues to display significant hostility, and severe irritability. Continues to believe she was set up by her daughter and her daughter's boyfriend.  Invega  Increased  to 12 mg qhs on 11/6.  Continues to be very reluctant about taking the higher dose of Invega. Despite her compliant with the higher dose of Invega off over the last several days patient does not appear to be responding to this antipsychotic. We have discontinue the Invega and started olanzapine on 11/14.  We will increase olanzapine today to 30 mg at bedtime  Started on depakote ER 1500 mg qhs--- we'll check Depakote level on Sunday night  -Irritability and agitation: Continue with Ativan 0.5 mg 3 times a day as needed.   -Alcohol use disorder: Patient's daughter report patient drinks heavily.  In need of substance abuse treatment upon discharge  - Alcohol withdrawal: resolved  -Hypertension:  continued with lisinopril 20 mg by mouth daily  -Dyslipidemia: continued with zocor daily  -Cardiovascular disease: Continue aspirin daily  -hypothyroid- Patient continued on levothyroxine  -History of chronic pain: Patient is currently receiving a Lidoderm patch apply to lower back, she has also order is for ibuprofen when necessary. She has past history of abusing narcotics.  -UTI: treated with fosfomycin  -B12 deficiency: continue B12 oral  -Tobacco use disorder: nicotine patch 21 mg  Precautions every 15 minute checks  Vital signs daily  Diet low sodium  Hospitalization status involuntary commitment--IVC hearing was held on 11/7  Labs: HIV -, RPR -, B12 low and ammonia level wnl.  HbA1c and lipid panel have  been completed  Imaging:  MRI of the brain no acute process  Possible d/c in about 3-5 days   Jimmy Footman, MD 02/04/2016, 10:14 AM

## 2016-02-05 NOTE — [facility] Group Notes (Signed)
[facility] LCSW Group Therapy  02/05/2016 3:33 PM  Type of Therapy:  Group Therapy  Participation Level:  Patient did not attend group. CSW invited patient to group.   Summary of Progress/Problems:Self esteem: Patients discussed self esteem and how it impacts them. They discussed what aspects in their lives has influenced their self esteem. They were challenged to identify changes that are needed in order to improve self esteem. Patients participated in activity where they had to identify positive adjectives they felt described their personality. Patients shared with the group on the following areas: Things I am good at, What I like about my appearance, I've helped others by, What I value the most, compliments I have received, challenges I have overcome, thing that make me unique, and Times I've made others happy. Patients also participated in positive socialization with a group game of "Jenga".   Jasher Barkan G. Garnette CzechSampson MSW, LCSWA 02/05/2016, 3:34 PM

## 2016-02-05 NOTE — Progress Notes (Signed)
Pt came to nurse's station stating "holly (another pt's family member here visiting) is telling my family information about me! And greg (MHT) is harassing me because [facility](RN) told him to!" Pt delusional, yelling, angry, pointing her finger. Will continue to monitor.

## 2016-02-05 NOTE — Progress Notes (Signed)
D: Patient is alert and oriented on the unit this shift. Patient attended and passively participated in groups today She states that others are disturbing her and looking at her. Patient denies suicidal ideation, homicidal ideation, auditory or visual hallucinations at the present time.  A: Scheduled medications are administered to patient as per MD orders. Emotional support and encouragement are provided. Patient is maintained on q.15 minute safety checks. Patient is informed to notify staff with questions or concerns. R: No adverse medication reactions are noted. Patient is cooperative with medication administration and treatment plan today. Patient is receptive, calm and cooperative but paranoid and slightly irritated with group and intermingling on the unit at this time. Patient does not interact  well with others on the unit this shift. Patient contracts for safety at this time. Patient remains safe at this time.

## 2016-02-05 NOTE — Progress Notes (Signed)
[facility] MD Progress Note  02/05/2016 11:35 AM Halena Mohar  MRN:  [id number]  Subjective:  Patient is a 50 year old female who reports to the ER by way of police after becoming agitated and acting bizarre at a local motel. Patient was a very poor historian due to thought blocking and delusion/confusion. Patient states that she came to [facility] from [place] to help her daughter. There was a dispute between the patient and her daughter and the daughter kicked her out of the house. Patient states that she went to a local motel where the employees started bullying her and not letting her sleep. She states that her daughter and her boyfriend told the employees to bully her. Patient states that she knows her family is here in the hospital admitted. She can hear them talking outside her door so she knows they are here. She also knows that there is an investigation going on. Patient is very agitated and states that she does not need to be here. She wants to go out and smoke. Patient has other delusions including that her phone and motel room was bugged by the police.  11/12 Patient continues to be paranoid and very irritable. Yesterday she was a little pleasant in the morning. However throughout the day she continues to ask the nursing staff about her discharge. This morning she does this clinician that everybody is colluding to not let her be discharged. She tells this clinician that even if she were to hire a lawyer that he would collude with the rest of her as and not let her be discharged. She is very irritable and starts getting loud and cursing staff. However she has not been physically aggressive so far. She did take Invega 12 mg with some coaxing from the nurse today. Denies suicidal or homicidal thoughts.  11/13 No change from last week she still labile ranging from crying to yelling and cussing. Has no insight into what brought her to the hospital. Continues to be paranoid and delusional about people being  after her and spying on her. She still thinks she is being setup and that is why she is here in the hospital. She is argumentative during assessment. I recommended a mood stabilizer, after much encouragement patient agrees with taking Depakote.   11/14 Daughter says yesterday she was less labile and not as hostile towards here. She however is reported as still delusional hearing voices telecommunication trough the walkie talkies. Nurses picking on her. Here pt continues to fight and acuses staff of making fun of her, laughing at her not attending to her needs. She she yells at me this morning says that I had not listened to her she still thinks she is here because of what somebody else said to about her prior to admission. Maisie Fus impossible to have a conversation with her. She gets very loud and starts using profanity.    11/15 today the patient has been calmer, pleasant and cooperative. She was able to ask for things without yelling or screaming without being demanding or hostile. She has been pleasant to me and the rest of the staff working with her. She did not voice any delusional thinking or paranoia or suspiciousness towards [place] or her daughter.  Yesterday she took first dose of olanzapine, she received 15 mg at bedtime. Patient says she was not able to sleep very well last night and therefore she has been is sleeping up until late this morning. According that she denied having any side effects from  the olanzapine. She denied having any suicidality, homicidality or auditory or visual hallucinations. She complained of having leg pain which she thinks is secondary to Raynaud. She was requesting to be started back on Neurontin however I advised her against her as she is already on Depakote.  11/16 patient is still reporting that nurses are making derogatory and insulting comments about her. She feels that the nurses are laughing at her at times. Appears continues to demand to be discharged. Continues to stay  there is absolutely no reason for her to need to be in the hospital. The patient was again hostile, demanding and irritable; however she actually appears  improved compared to my prior interactions with her. Yesterday she had a very good day where she was pleasant and cooperative. She was respectful to staff. She complied with medications. She was out of her room. She did not show any hostility towards any staff up until the evening. She continues to denied any mood symptoms or any psychotic symptoms. She is not reporting having any more side effects to medications. She denies physical complaints other than her issues with chronic pain. Slowly she appears to be improving.  11/17 today once again she is hostile, irritable and argumentative. However her irritability and is less severe than he was before. The patient was somewhat more redirectable. And the content of her comments did not appear to be delusional. Patient has been attending groups. She has not been disruptive in groups. Patient says she is not sleeping well at night. However she denies having side effects to it she specializes Zyprexa however she tells me she does not plan to take the Zyprexa and Depakote after discharge. She demanding discharge again today. As far as physical complaints he continues to complain of having pain all over.  11/18 patient once again agitated. She called me a jerk. Demanding discharge. Continues to have no insight into what led to her admission. Even though she is hostile. The paranoia has significantly decreased. And irritability is not as intense and severe as it was when she first came in. She is now somewhat redirectable. She has been attending groups and is not disruptive. She is less hostile to nursing staff. She says that last night she refused Depakote and does not plan to take the medication when she leaves.   Per nursing:  11/17 Patient is alert and oriented on the unit this shift. Patient attended and  passively participated in groups today She states that others are disturbing her and looking at her. Patient denies suicidal ideation, homicidal ideation, auditory or visual hallucinations at the present time.   11/16  Pt denies SI/HI/AVH. Pt is pleasant and cooperative with treatment plan and she is less irritable. Patient's thoughts are less disorganized. Pt appears less anxious and she is interacting with peers and staff appropriately.   11/15 Observed pt in hallway talking with peer. Patient alert and oriented x4. Patient denies SI/HI/AVH. Pt affect anxious and labile. For most of the evening, pt was pleasant and appropriate. Pt was not nearly as fixated on leaving nor expressing negativity about staff. Pt stated her day was "great." Pt indicated she felt that the Zyprexa was "smoother" and working better than the [place]. Later in the evening when pt was in room, pt mentioned to writer "I'm still upset about our conversation earlier." Pt briefly alluded to a conversation she claims she had with Clinical research associate wherein she was offended and upset. No such conversation occurred, and when write informed pt of  this, pt accused other staff members of similar activity.   Principal Problem: Bipolar 1 disorder, mixed, severe (HCC) Diagnosis:   Patient Active Problem List   Diagnosis Date Noted  . Alcohol use disorder, severe, dependence (HCC) [F10.20] 01/20/2016  . Alcohol withdrawal (HCC) [F10.239] 01/20/2016  . Cannabis use disorder, mild, abuse [F12.10] 01/20/2016  . Tobacco use disorder [F17.200] 01/20/2016  . HTN (hypertension) [I10] 01/20/2016  . Hypothyroidism [E03.9] 01/20/2016  . Opioid use disorder, moderate, dependence (HCC) [F11.20] 01/20/2016  . Bipolar 1 disorder, mixed, severe (HCC) [F31.63] 01/19/2016   Total Time spent with patient: 30 minutes  Past Psychiatric History: Patient denies past psychiatric history. Patient's mother states that patient has been diagnosed with anxiety. She denies  any previous hospitalizations due to mental health reasons.  Past Medical History:  Past Medical History:  Diagnosis Date  . Anxiety   . Hx of heart valve insufficiency   . Hypertension   . Thyroid disease   . Urinary incontinence     Past Surgical History:  Procedure Laterality Date  . ANTERIOR FUSION CERVICAL SPINE     C5, C6  . BACK SURGERY    . TONSILLECTOMY    . TUBAL LIGATION     Family History: History reviewed. No pertinent family history.   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 Patient's mother does not know why she was fired. Patient has a daughter in [place] and a son. She is currently living with her parents and aunt.  Denies legal trouble.     History  Alcohol Use  . Yes    Comment: 3-4 times weekly      History  Drug Use  . Types: Marijuana    Social History   Social History  . Marital status: Single    Spouse name: N/A  . Number of children: N/A  . Years of education: N/A   Social History Main Topics  . Smoking status: Current Every Day Smoker    Packs/day: 1.50    Types: Cigarettes  . Smokeless tobacco: Never Used  . Alcohol use Yes     Comment: 3-4 times weekly   . Drug use:     Types: Marijuana  . Sexual activity: No   Other Topics Concern  . None   Social History Narrative  . None   Additional Social History:    History of alcohol / drug use?: Yes Negative Consequences of Use: Financial, Personal relationships, Work / Programmer, multimedia     Current Medications: Current Facility-Administered Medications  Medication Dose Route Frequency Provider Last Rate Last Dose  . acetaminophen (TYLENOL) tablet 650 mg  650 mg Oral Q6H PRN Audery Amel, MD   650 mg at 02/01/16 1534  . alum & mag hydroxide-simeth (MAALOX/MYLANTA) 200-200-20 MG/5ML suspension 30 mL  30 mL Oral Q4H PRN Audery Amel, MD   30 mL at  01/31/16 1036  . aspirin EC tablet 81 mg  81 mg Oral Daily Audery Amel, MD   81 mg at 02/05/16 0844  . diphenhydrAMINE (BENADRYL) capsule 25 mg  25 mg Oral Q8H PRN Jimmy Footman, MD   25 mg at 02/03/16 1205  . diphenhydrAMINE (BENADRYL) capsule 25 mg  25 mg Oral QHS Jimmy Footman, MD   25 mg at 02/04/16 2051  . divalproex (DEPAKOTE ER) 24 hr tablet 1,500 mg  1,500  mg Oral QHS Jimmy FootmanAndrea Hernandez-Gonzalez, MD   1,500 mg at 02/03/16 2150  . ibuprofen (ADVIL,MOTRIN) tablet 600 mg  600 mg Oral Q6H PRN Jimmy FootmanAndrea Hernandez-Gonzalez, MD   600 mg at 02/05/16 0844  . levothyroxine (SYNTHROID, LEVOTHROID) tablet 125 mcg  125 mcg Oral QAC breakfast Jimmy FootmanAndrea Hernandez-Gonzalez, MD   75 mcg at 02/05/16 0520  . lidocaine (LIDODERM) 5 % 1 patch  1 patch Transdermal Q24H Audery AmelJohn T Clapacs, MD   1 patch at 01/28/16 1729  . lisinopril (PRINIVIL,ZESTRIL) tablet 20 mg  20 mg Oral Daily Jimmy FootmanAndrea Hernandez-Gonzalez, MD   20 mg at 02/05/16 0844  . LORazepam (ATIVAN) tablet 0.5 mg  0.5 mg Oral TID PRN Jimmy FootmanAndrea Hernandez-Gonzalez, MD   0.5 mg at 02/05/16 0516  . magnesium hydroxide (MILK OF MAGNESIA) suspension 30 mL  30 mL Oral Daily PRN Audery AmelJohn T Clapacs, MD      . nicotine (NICODERM CQ - dosed in mg/24 hours) patch 21 mg  21 mg Transdermal Daily Jimmy FootmanAndrea Hernandez-Gonzalez, MD   21 mg at 02/05/16 0844  . OLANZapine zydis (ZYPREXA) disintegrating tablet 30 mg  30 mg Oral QHS Jimmy FootmanAndrea Hernandez-Gonzalez, MD   30 mg at 02/04/16 2052  . simvastatin (ZOCOR) tablet 20 mg  20 mg Oral q1800 Audery AmelJohn T Clapacs, MD   20 mg at 02/04/16 1715  . traZODone (DESYREL) tablet 100 mg  100 mg Oral QHS PRN Audery AmelJohn T Clapacs, MD   100 mg at 02/03/16 2148  . vitamin B-12 (CYANOCOBALAMIN) tablet 1,000 mcg  1,000 mcg Oral Daily Jimmy FootmanAndrea Hernandez-Gonzalez, MD   1,000 mcg at 02/05/16 [id number]    Lab Results:  No results found for this or any previous visit (from the past 48 hour(s)).  Blood Alcohol level:  Lab Results  Component Value Date   ETH  <5 01/19/2016    Metabolic Disorder Labs: Lab Results  Component Value Date   HGBA1C 5.4 01/20/2016   MPG 108 01/20/2016   Lab Results  Component Value Date   PROLACTIN 54.9 (H) 01/20/2016   Lab Results  Component Value Date   CHOL 139 01/20/2016   TRIG 341 (H) 01/20/2016   HDL 32 (L) 01/20/2016   CHOLHDL 4.3 01/20/2016   VLDL 68 (H) 01/20/2016   LDLCALC 39 01/20/2016    Physical Findings: AIMS: Facial and Oral Movements Muscles of Facial Expression: None, normal Lips and Perioral Area: None, normal Jaw: None, normal Tongue: None, normal,Extremity Movements Upper (arms, wrists, hands, fingers): None, normal Lower (legs, knees, ankles, toes): None, normal, Trunk Movements Neck, shoulders, hips: None, normal, Overall Severity Severity of abnormal movements (highest score from questions above): None, normal Incapacitation due to abnormal movements: None, normal Patient's awareness of abnormal movements (rate only patient's report): No Awareness, Dental Status Current problems with teeth and/or dentures?: No Does patient usually wear dentures?: No  CIWA:  CIWA-Ar Total: 1 COWS:     Musculoskeletal: Strength & Muscle Tone: within normal limits Gait & Station: normal Patient leans: N/A  Psychiatric Specialty Exam: Physical Exam  Nursing note and vitals reviewed. Constitutional: She appears well-developed and well-nourished.  HENT:  Head: Normocephalic and atraumatic.  Eyes: Conjunctivae are normal. Pupils are equal, round, and reactive to Levandowski.  Neck: Normal range of motion.  Cardiovascular: Normal heart sounds.   Respiratory: Effort normal. No respiratory distress.  GI: Soft.  Musculoskeletal: Normal range of motion.  Neurological: She is alert.  Skin: Skin is warm and dry.    Review of Systems  Constitutional: Negative.  HENT: Negative.   Eyes: Negative.   Respiratory: Negative.   Cardiovascular: Negative.   Gastrointestinal: Negative.   Genitourinary:  Negative.   Musculoskeletal: Positive for back pain and joint pain.  Skin: Negative.   Neurological: Negative.   Endo/Heme/Allergies: Negative.   Psychiatric/Behavioral: Positive for depression and substance abuse. Negative for hallucinations, memory loss and suicidal ideas. The patient has insomnia. The patient is not nervous/anxious.   All other systems reviewed and are negative.   Blood pressure 111/62, pulse 79, temperature 98.7 F (37.1 C), resp. rate 20, height 5\' 2"  (1.575 m), weight 83 kg (182 lb 15.7 oz), SpO2 100 %.Body mass index is 33.47 kg/m.  General Appearance: Casual  Eye Contact:  Fair  Speech:  Clear and Coherent  Volume:  Normal  Mood:  Depressed, irritable   Affect:  Congruent  Thought Process:  Goal Directed  Orientation:  Full (Time, Place, and Person)  Thought Content:  Logical  Suicidal Thoughts:  No  Homicidal Thoughts:  No  Memory:  Immediate;   Good Recent;   Fair Remote;   Fair  Judgement:  Fair  Insight:  Lacking  Psychomotor Activity:  Normal  Concentration:  Concentration: Poor and Attention Span: Poor  Recall:  Poor  Fund of Knowledge:  Fair  Language:  Fair  Akathisia:  No  Handed:  Right  AIMS (if indicated):     Assets:  Communication Skills Desire for Improvement Physical Health Resilience  ADL's:  Intact  Cognition:  WNL  Sleep:  Number of Hours: 6.3     Treatment Plan Summary: Daily contact with patient to assess and evaluate symptoms and progress in treatment and Medication management   Minimal improvement since admission. She has been started on olanzapine and Depakote. Depakote level will be checked on Sunday. Zyprexa Has been titrated up to 30 mg.  Per [employer] report, patient had come down to "help" her daughter who lives here and to give the daughter her car. The daughter kicked the patient out who went to stay in a motel. Police were called by the motel staff due to issues between the patient and their staff, the  patient going into areas where she didn't belong and for "hearing voices."  R/o Bipolar disorder: Unclear diagnosis at this time. Patient does not have a prior psychiatric history has never received inpatient or outpatient treatment. Prior to admission with thoughts patient was taking Paxil. Per collateral information obtained from the daughter  the symptoms have been present for at least 30 days (since she came to [facility])  -Bipolar d/o: Patient continues to display significant hostility, and severe irritability. Continues to believe she was set up by her daughter and her daughter's boyfriend.  Invega  Increased to 12 mg qhs on 11/6.  Continues to be very reluctant about taking the higher dose of Invega. Despite her compliant with the higher dose of Invega off over the last several days patient does not appear to be responding to this antipsychotic. We have discontinue the Invega and started olanzapine on 11/14.  Olanzapine was increased on 11/17 to 30 mg  Started on depakote ER 1500 mg qhs--- we'll check Depakote level on Sunday night--- she refused Depakote last night  -Irritability and agitation: Continue with Ativan 0.5 mg 3 times a day as needed.   -Alcohol use disorder: Patient's daughter report patient drinks heavily.  In need of substance abuse treatment upon discharge  - Alcohol withdrawal: resolved  -Hypertension:  continued with lisinopril 20 mg by  mouth daily  -Dyslipidemia: continued with zocor daily  -Cardiovascular disease: Continue aspirin daily  -hypothyroid- Patient continued on levothyroxine 125mcg  -History of chronic pain: Patient is currently receiving a Lidoderm patch apply to lower back, she has also order is for ibuprofen when necessary. She has past history of abusing narcotics.  -UTI: treated with fosfomycin  -B12 deficiency: continue B12 oral  -Tobacco use disorder: nicotine patch 21 mg  Precautions every 15 minute checks  Vital signs daily  Diet low  sodium  Hospitalization status involuntary commitment--IVC hearing was held on 11/7  Labs: HIV -, RPR -, B12 low and ammonia level wnl.  HbA1c and lipid panel have been completed  Imaging:  MRI of the brain no acute process  Possible d/c in about 3-5 days   I asked the social worker to contact patient's daughter. As we potentially the patient could be discharged sometime early next week.  Jimmy FootmanHernandez-Gonzalez,  Sarit Sparano, MD 02/05/2016, 11:35 AM

## 2016-02-05 NOTE — Plan of Care (Signed)
Problem: Coping: Goal: Ability to cope will improve Outcome: Progressing Patient instructed how to cope with others around her and try to obtain the benefits of group in the presence of other patients .She states understanding Tax adviserCTownsend RN

## 2016-02-05 NOTE — Progress Notes (Signed)
Patient ID: Isaiah Sergehristine Catena, female   DOB: [birth year], 50 y.o.   MRN: [id number]  Patient came to nurse station and became hostile and angry with staff yelling "I want to know who is still contacting my daughter, I want all the names of the people whose been contacting my daughter, yall are ruining my life! Francesca OmanYall are all conspiring with my daughter, I know it." The charge nurse, Arnoldo LenisAndrea Breedlove, RN, showed patient the "consent to release information" form that patient signed 01/21/2016 for staff to contact daughter, Gean Quinticole Rostron in regards to patient's care. Patient then started yelling stating "I want to watch you shred that paper, I don't want anyone talking to my daughter about anything, Pt now signed a release of info form to indicate that she declines on having staff speak to anyone regarding her care, witnessed by Arnoldo LenisAndrea Breedlove, RN and Leonarda SalonGigi George Maniattu, RN.   Jailyn Langhorst G. Garnette CzechSampson MSW, LCSWA 02/05/2016 1:44 PM

## 2016-02-05 NOTE — Progress Notes (Signed)
Pt is very upset, yelling at staff, states "I want all of the names of the staff that have been talking to my daughter about me", writer showed the patient the release of information form that she signed on 01/21/16 giving staff permission to speak with her daughter about her care, pt is very angry and states "I want to watch you shred that paper, I don't want anyone talking to my daughter about anything, Pt now signed a release of info form to indicate that she declines on having staff speak to anyone regarding her care.

## 2016-02-05 NOTE — Progress Notes (Signed)
Patient is very irritable and demanding for discharge.Patient is talking loud & cursing staff in the hallway.Patient asked for her lunch tray in her room.When her request is denied she came to day room for lunch.Compliant with medications.No interaction with peers.Isolated in room most of the time.Appetite & energy level good.Sated that she does not want to talk to anybody.

## 2016-02-06 LAB — VALPROIC ACID LEVEL: Valproic Acid Lvl: 50 ug/mL (ref 50.0–100.0)

## 2016-02-06 NOTE — Progress Notes (Signed)
Patient stayed in bed and got up at breakfast and medication time. Alert and oriented x 4 but angry, irritable, blaming and denial. "I told you all,  several times,  that I don't need to be here, I need to go home". Continues to report that "there is nothing wrong with me, just get me out of here". Patient received her morning medications. Requested Ibuprofen but was explained that she had a dose recently: patient understood and accepted to wait for next dose. Patient  ate breakfast. Currently in the dayroom, quiet, flat, no interactions with peers. Safety precautions maintained per unit protocol.

## 2016-02-06 NOTE — Progress Notes (Signed)
Patient was often in room but attended SW group. Was also in the dayroom but had limited interactions with staff and peers. Continues to complain that "no one cares about me, I have been asking to be discharged but people won't listen...". Patient complained of pain, anxiety and itching and was medicated as ordered. Discharge information provided. Emotional support and encouragements provided and therapeutic milieu promoted. Patient's safety maintained per unit protocol.

## 2016-02-06 NOTE — Plan of Care (Signed)
Problem: Safety: Goal: Ability to remain free from injury will improve Outcome: Progressing Pt has remained free from injury.  Problem: Coping: Goal: Ability to verbalize feelings will improve Outcome: Progressing Pt is able to verbalize feelings.

## 2016-02-06 NOTE — Plan of Care (Signed)
Problem: Safety: Goal: Ability to redirect hostility and anger into socially appropriate behaviors will improve Outcome: Progressing Redirectable when irritated and angry. Patient is progressing by learning coping mechanisms when others make her upset Tax adviserCTownsend RN

## 2016-02-06 NOTE — Progress Notes (Signed)
[facility] MD Progress Note  02/06/2016 2:23 PM Catherine Lester  MRN:  [id number]  Subjective:  Patient is a 50 year old female who reports to the ER by way of police after becoming agitated and acting bizarre at a local motel. Patient was a very poor historian due to thought blocking and delusion/confusion. Patient states that she came to [facility] from [place] to help her daughter. There was a dispute between the patient and her daughter and the daughter kicked her out of the house. Patient states that she went to a local motel where the employees started bullying her and not letting her sleep. She states that her daughter and her boyfriend told the employees to bully her. Patient states that she knows her family is here in the hospital admitted. She can hear them talking outside her door so she knows they are here. She also knows that there is an investigation going on. Patient is very agitated and states that she does not need to be here. She wants to go out and smoke. Patient has other delusions including that her phone and motel room was bugged by the police.  Patient more irritable this morning. She says she didn't sleep very well last night because her room was very hot. She is demanding to be discharged today. She states she is tolerating well the Zyprexa but is states she doesn't like to take the Depakote and does not plan to continue the Depakote after discharge. She refused it on Friday night but took it again on Saturday. She denies suicidality, homicidality or auditory or visual hallucinations. Her yesterday afternoon she told staff that she no longer wanted to allow communication with her daughter. She revoked her consent. Topamax okay if I talk to her brother Renae Fickle phone number [phone]  Per nursing: Patient stayed in bed and got up at breakfast and medication time. Alert and oriented x 4 but angry, irritable, blaming and denial. "I told you all,  several times,  that I don't need to be here, I need  to go home". Continues to report that "there is nothing wrong with me, just get me out of here". Patient received her morning medications. Requested Ibuprofen but was explained that she had a dose recently: patient understood and accepted to wait for next dose. Patient  ate breakfast. Currently in the dayroom, quiet, flat, no interactions with peers. Safety precautions maintained per unit protocol.   Pt is alert and oriented x3, respirations even and unlabored, gait steady and unassisted, no acute distress noted. Denies SI/HI/AVH and depression but does state that she feels anxious of 4/10 because "I need to go home. I have to get out. All of you here are treating me horrible. 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 Pt c/o bedroom being too cold stating "you guys are working against me, my room is too cold and you're turning the [facility] on, on me! The temperature in pts room was 78 degrees. Pt then accused this Clinical research associate of telling her that she could shave after 10pm and then telling her no. This Clinical research associate told the pt that she could not shave after 10pm. Pt then proceeded to ask another nurse, who let her shave, after 10pm. A few minutes later, pt came out of bedroom yelling "I want you to turn my air down! It's too hot! You all are  ganging up on me! Turn my air down! You got it! I want it down to 73 degrees! Are we clear! Are we clear!" Pts heat was turned down to 73 degrees. Pt emerged from bedroom again yelling "you all are some terrible nurses! You don't know how to take care of your patients! You don't have a clue!"  Pt then complained about room being too cold again. Temp was turned up to 80 degrees. No further complaints about room temperature. Pt then approached this writer to ask for some Motrin for generalized pain. PRN Motrin 600 mg given as ordered. No further complaints. Is now in the bed  resting with eyes closed. No further complaints. Remains on q15 minute observation checks for safety. Will continue to monitor.    Principal Problem: Bipolar 1 disorder, mixed, severe (HCC) Diagnosis:   Patient Active Problem List   Diagnosis Date Noted  . Alcohol use disorder, severe, dependence (HCC) [F10.20] 01/20/2016  . Alcohol withdrawal (HCC) [F10.239] 01/20/2016  . Cannabis use disorder, mild, abuse [F12.10] 01/20/2016  . Tobacco use disorder [F17.200] 01/20/2016  . HTN (hypertension) [I10] 01/20/2016  . Hypothyroidism [E03.9] 01/20/2016  . Opioid use disorder, moderate, dependence (HCC) [F11.20] 01/20/2016  . Bipolar 1 disorder, mixed, severe (HCC) [F31.63] 01/19/2016   Total Time spent with patient: 30 minutes  Past Psychiatric History: Patient denies past psychiatric history. Patient's mother states that patient has been diagnosed with anxiety. She denies any previous hospitalizations due to mental health reasons.  Past Medical History:  Past Medical History:  Diagnosis Date  . Anxiety   . Hx of heart valve insufficiency   . Hypertension   . Thyroid disease   . Urinary incontinence     Past Surgical History:  Procedure Laterality Date  . ANTERIOR FUSION CERVICAL SPINE     C5, C6  . BACK SURGERY    . TONSILLECTOMY    . TUBAL LIGATION     Family History: History reviewed. No pertinent family history.   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 Patient's mother does not know why she was fired. Patient has a daughter in [place] and a son. She is currently living with her parents and aunt.  Denies legal trouble.     History  Alcohol Use  . Yes    Comment: 3-4 times weekly      History  Drug Use  . Types: Marijuana    Social History   Social History  . Marital status: Single    Spouse name: N/A  . Number of children:  N/A  . Years of education: N/A   Social History Main Topics  . Smoking status: Current Every Day Smoker    Packs/day: 1.50    Types: Cigarettes  . Smokeless tobacco: Never Used  . Alcohol use Yes     Comment: 3-4 times weekly   . Drug use:     Types: Marijuana  . Sexual activity: No   Other Topics Concern  . None   Social History Narrative  . None   Additional Social History:    History of alcohol / drug use?: Yes Negative Consequences of Use: Financial, Personal relationships, Work / Programmer, multimediachool     Current Medications: Current Facility-Administered Medications  Medication Dose Route Frequency Provider Last Rate Last Dose  . acetaminophen (TYLENOL) tablet 650 mg  650 mg Oral Q6H  PRN Audery AmelJohn T Clapacs, MD   650 mg at 02/01/16 1534  . alum & mag hydroxide-simeth (MAALOX/MYLANTA) 200-200-20 MG/5ML suspension 30 mL  30 mL Oral Q4H PRN Audery AmelJohn T Clapacs, MD   30 mL at 02/05/16 1811  . aspirin EC tablet 81 mg  81 mg Oral Daily Audery AmelJohn T Clapacs, MD   81 mg at 02/06/16 0924  . diphenhydrAMINE (BENADRYL) capsule 25 mg  25 mg Oral Q8H PRN Jimmy FootmanAndrea Hernandez-Gonzalez, MD   25 mg at 02/06/16 1235  . diphenhydrAMINE (BENADRYL) capsule 25 mg  25 mg Oral QHS Jimmy FootmanAndrea Hernandez-Gonzalez, MD   25 mg at 02/05/16 2113  . divalproex (DEPAKOTE ER) 24 hr tablet 1,500 mg  1,500 mg Oral QHS Jimmy FootmanAndrea Hernandez-Gonzalez, MD   1,500 mg at 02/05/16 2113  . ibuprofen (ADVIL,MOTRIN) tablet 600 mg  600 mg Oral Q6H PRN Jimmy FootmanAndrea Hernandez-Gonzalez, MD   600 mg at 02/06/16 [id number]  . levothyroxine (SYNTHROID, LEVOTHROID) tablet 125 mcg  125 mcg Oral QAC breakfast Jimmy FootmanAndrea Hernandez-Gonzalez, MD   125 mcg at 02/06/16 [phone]  . lidocaine (LIDODERM) 5 % 1 patch  1 patch Transdermal Q24H Audery AmelJohn T Clapacs, MD   1 patch at 01/28/16 1729  . lisinopril (PRINIVIL,ZESTRIL) tablet 20 mg  20 mg Oral Daily Jimmy FootmanAndrea Hernandez-Gonzalez, MD   20 mg at 02/06/16 0924  . LORazepam (ATIVAN) tablet 0.5 mg  0.5 mg Oral TID PRN Jimmy FootmanAndrea Hernandez-Gonzalez, MD   0.5  mg at 02/06/16 [id number]  . magnesium hydroxide (MILK OF MAGNESIA) suspension 30 mL  30 mL Oral Daily PRN Audery AmelJohn T Clapacs, MD      . nicotine (NICODERM CQ - dosed in mg/24 hours) patch 21 mg  21 mg Transdermal Daily Jimmy FootmanAndrea Hernandez-Gonzalez, MD   21 mg at 02/06/16 0923  . OLANZapine zydis (ZYPREXA) disintegrating tablet 30 mg  30 mg Oral QHS Jimmy FootmanAndrea Hernandez-Gonzalez, MD   30 mg at 02/05/16 2113  . simvastatin (ZOCOR) tablet 20 mg  20 mg Oral q1800 Audery AmelJohn T Clapacs, MD   20 mg at 02/05/16 1811  . traZODone (DESYREL) tablet 100 mg  100 mg Oral QHS PRN Audery AmelJohn T Clapacs, MD   100 mg at 02/05/16 2113  . vitamin B-12 (CYANOCOBALAMIN) tablet 1,000 mcg  1,000 mcg Oral Daily Jimmy FootmanAndrea Hernandez-Gonzalez, MD   1,000 mcg at 02/06/16 [id number]    Lab Results:  No results found for this or any previous visit (from the past 48 hour(s)).  Blood Alcohol level:  Lab Results  Component Value Date   ETH <5 01/19/2016    Metabolic Disorder Labs: Lab Results  Component Value Date   HGBA1C 5.4 01/20/2016   MPG 108 01/20/2016   Lab Results  Component Value Date   PROLACTIN 54.9 (H) 01/20/2016   Lab Results  Component Value Date   CHOL 139 01/20/2016   TRIG 341 (H) 01/20/2016   HDL 32 (L) 01/20/2016   CHOLHDL 4.3 01/20/2016   VLDL 68 (H) 01/20/2016   LDLCALC 39 01/20/2016    Physical Findings: AIMS: Facial and Oral Movements Muscles of Facial Expression: None, normal Lips and Perioral Area: None, normal Jaw: None, normal Tongue: None, normal,Extremity Movements Upper (arms, wrists, hands, fingers): None, normal Lower (legs, knees, ankles, toes): None, normal, Trunk Movements Neck, shoulders, hips: None, normal, Overall Severity Severity of abnormal movements (highest score from questions above): None, normal Incapacitation due to abnormal movements: None, normal Patient's awareness of abnormal movements (rate only patient's report): No Awareness, Dental Status Current problems with teeth and/or dentures?:  No Does patient usually wear dentures?: No  CIWA:  CIWA-Ar Total: 1 COWS:     Musculoskeletal: Strength & Muscle Tone: within normal limits Gait & Station: normal Patient leans: N/A  Psychiatric Specialty Exam: Physical Exam  Nursing note and vitals reviewed. Constitutional: She appears well-developed and well-nourished.  HENT:  Head: Normocephalic and atraumatic.  Eyes: Conjunctivae are normal. Pupils are equal, round, and reactive to [place].  Neck: Normal range of motion.  Cardiovascular: Normal heart sounds.   Respiratory: Effort normal. No respiratory distress.  GI: Soft.  Musculoskeletal: Normal range of motion.  Neurological: She is alert.  Skin: Skin is warm and dry.    Review of Systems  Constitutional: Negative.   HENT: Negative.   Eyes: Negative.   Respiratory: Negative.   Cardiovascular: Negative.   Gastrointestinal: Negative.   Genitourinary: Negative.   Musculoskeletal: Positive for back pain and joint pain.  Skin: Negative.   Neurological: Negative.   Endo/Heme/Allergies: Negative.   Psychiatric/Behavioral: Positive for depression and substance abuse. Negative for hallucinations, memory loss and suicidal ideas. The patient has insomnia. The patient is not nervous/anxious.   All other systems reviewed and are negative.   Blood pressure 119/63, pulse 83, temperature 98.5 F (36.9 C), temperature source Oral, resp. rate 18, height 5\' 2"  (1.575 m), weight 83 kg (182 lb 15.7 oz), SpO2 100 %.Body mass index is 33.47 kg/m.  General Appearance: Casual  Eye Contact:  Fair  Speech:  Clear and Coherent  Volume:  Normal  Mood:  Depressed, irritable   Affect:  Congruent  Thought Process:  Goal Directed  Orientation:  Full (Time, Place, and Person)  Thought Content:  Logical  Suicidal Thoughts:  No  Homicidal Thoughts:  No  Memory:  Immediate;   Good Recent;   Fair Remote;   Fair  Judgement:  Fair  Insight:  Lacking  Psychomotor Activity:  Normal   Concentration:  Concentration: Poor and Attention Span: Poor  Recall:  Poor  Fund of Knowledge:  Fair  Language:  Fair  Akathisia:  No  Handed:  Right  AIMS (if indicated):     Assets:  Communication Skills Desire for Improvement Physical Health Resilience  ADL's:  Intact  Cognition:  WNL  Sleep:  Number of Hours: 5     Treatment Plan Summary: Daily contact with patient to assess and evaluate symptoms and progress in treatment and Medication management   Minimal improvement since admission. She has been started on olanzapine and Depakote. Depakote level will be checked on Sunday. Zyprexa Has been titrated up to 30 mg.  Per [employer] report, patient had come down to "help" her daughter who lives here and to give the daughter her car. The daughter kicked the patient out who went to stay in a motel. Police were called by the motel staff due to issues between the patient and their staff, the patient going into areas where she didn't belong and for "hearing voices."  R/o Bipolar disorder: Unclear diagnosis at this time. Patient does not have a prior psychiatric history has never received inpatient or outpatient treatment. Prior to admission with thoughts patient was taking Paxil. Per collateral information obtained from the daughter  the symptoms have been present for at least 30 days (since she came to [facility])  -Bipolar d/o: Patient continues to display significant hostility, and severe irritability. Continues to believe she was set up by her daughter and her daughter's boyfriend.  Invega  Increased to 12 mg qhs on 11/6.  Continues  to be very reluctant about taking the higher dose of Invega. Despite her compliant with the higher dose of Invega off over the last several days patient does not appear to be responding to this antipsychotic. We have discontinue the Invega and started olanzapine on 11/14.  Olanzapine was increased on 11/17 to 30 mg  Started on depakote ER 1500 mg qhs---  we'll check Depakote level on Sunday night--- she refused Depakote on Friday night but was compliant with it last night  -Irritability and agitation: Continue with Ativan 0.5 mg 3 times a day as needed.   -Alcohol use disorder: Patient's daughter report patient drinks heavily.  In need of substance abuse treatment upon discharge  - Alcohol withdrawal: resolved  -Hypertension:  continued with lisinopril 20 mg by mouth daily  -Dyslipidemia: continued with zocor daily  -Cardiovascular disease: Continue aspirin daily  -hypothyroid- Patient continued on levothyroxine  -History of chronic pain: Patient is currently receiving a Lidoderm patch apply to lower back, she has also order is for ibuprofen when necessary. She has past history of abusing narcotics.  -UTI: treated with fosfomycin  -B12 deficiency: continue B12 oral  -Tobacco use disorder: nicotine patch 21 mg  Precautions every 15 minute checks  Vital signs daily  Diet low sodium  Hospitalization status involuntary commitment--IVC hearing was held on 11/7  Labs: HIV -, RPR -, B12 low and ammonia level wnl.  HbA1c and lipid panel have been completed  Imaging:  MRI of the brain no acute process  Possible d/c early next week we'll discuss with the family  During prior conversation with the daughter just started indicated the patient chronically has an irritable mood. She feels her mood and irritability are as they usually are however her major concern when patient initially came to the hospital was the paranoia. The patient continues to display paranoia however appears it is less severe than he was before. Patient says she likes the Zyprexa and does plan to continue taking this medication however she says she will not continue the Depakote after discharge.  She revoked consent for [place] to speak with the daughter asked her daughter is advocating for psychiatric treatment. Her mother apparently feels that the  patient does not need to be in the hospital. The patient allow [place] to contact her brother now will contact him before discharge.  Jimmy Footman, MD 02/06/2016, 2:23 PM

## 2016-02-06 NOTE — Plan of Care (Signed)
Problem: Coping: Goal: Ability to cope will improve Outcome: Not Progressing Patient remains irritable, angry, demanding and blaming

## 2016-02-06 NOTE — Plan of Care (Signed)
Problem: Pain Managment: Goal: General experience of comfort will improve Outcome: Progressing Patient's pain is monitored and managed by prescribed medications

## 2016-02-06 NOTE — Plan of Care (Signed)
Problem: Safety: Goal: Periods of time without injury will increase Outcome: Progressing Denies SI/HI, able to communicate her feelings and concerns

## 2016-02-06 NOTE — Progress Notes (Signed)
Pt is alert and oriented x3, respirations even and unlabored, gait steady and unassisted, no acute distress noted. Denies SI/HI/AVH and depression but does state that she feels anxious of 4/10 because "I need to go home. I have to get out. All of you here are treating me horrible. 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 Pt c/o bedroom being too cold stating "you guys are working against me, my room is too cold and you're turning the [facility] on, on me! The temperature in pts room was 78 degrees. Pt then accused this Clinical research associatewriter of telling her that she could shave after 10pm and then telling her no. This Clinical research associatewriter told the pt that she could not shave after 10pm. Pt then proceeded to ask another nurse, who let her shave, after 10pm. A few minutes later, pt came out of bedroom yelling "I want you to turn my air down! It's too hot! You all are ganging up on me! Turn my air down! You got it! I want it down to 73 degrees! Are we clear! Are we clear!" Pts heat was turned down to 73 degrees. Pt emerged from bedroom again yelling "you all are some terrible nurses! You don't know how to take care of your patients! You don't have a clue!"  Pt then complained about room being too cold again. Temp was turned up to 80 degrees. No further complaints about room temperature. Pt then approached this writer to ask for some Motrin for generalized pain. PRN Motrin 600 mg given as ordered. No further complaints. Is now in the bed resting with eyes closed. No further complaints. Remains on q15 minute observation checks for safety. Will continue to monitor.

## 2016-02-06 NOTE — [facility] Group Notes (Signed)
[facility] LCSW Group Therapy  02/06/2016 12:42 PM  Type of Therapy:  Group Therapy  Participation Level:  Minimal  Participation Quality:  Attentive  Affect:  Irritable  Cognitive:  Alert  Insight:  None  Engagement in Therapy:  Limited  Modes of Intervention:  Activity, Discussion, Education, Dance movement psychotherapisteality Testing, Socialization and Support  Summary of Progress/Problems: Safety Planning: Patients identified fears or worries surrounding discharge. Patients offered support to their peers and openly developed safety plans for their individual needs. Patients developed their own safety plan. Patients discussed their warning signs, coping strategies, support system with family and friends, identified mental health professionals, and how to keep their environments safe (ex. Removing unnecessary medications or removing weapons/guns). Patients then discussed their personalized safety plan with the group. Patient came to group and mostly remained quiet during group. Patient stated towards the end of group "I wish people knew I wasn't a bitch". CSW provided support to patient. Patient remained quiet for the rest of group.   Catherine Lester Catherine Lester MSW, LCSWA 02/06/2016, 12:50 PM

## 2016-02-06 NOTE — Progress Notes (Signed)
D: Patient is alert and oriented,often irritable, angry and demanding on the unit this shift.She isolates today and stays in her room. Patient not attended and actively participated in groups today. Patient denies suicidal ideation, homicidal ideation, auditory or visual hallucinations at the present time.  A: Scheduled medications are administered to patient as per MD orders. Emotional support and encouragement are provided. Patient is maintained on q.15 minute safety checks. Patient is informed to notify staff with questions or concerns. R: No adverse medication reactions are noted. Patient is cooperative with medication administration and part of  treatment plan today. Patient is non receptive, calm and cooperative on the unit at this time. Patient does not  Interact with others on the unit this shift. Patient contracts for safety at this time. Patient remains safe at this time.

## 2016-02-06 NOTE — [facility] Group Notes (Signed)
[facility] Group Notes:  (Nursing/MHT/Case Management/Adjunct)  Date:  02/06/2016  Time:  9:56 PM  Type of Therapy:  Group Therapy  Participation Level:  Did Not Attend    Catherine Lester 02/06/2016, 9:56 PM 

## 2016-02-07 DIAGNOSIS — F250 Schizoaffective disorder, bipolar type: Secondary | ICD-10-CM

## 2016-02-07 MED ORDER — OLANZAPINE 15 MG PO TABS: 30.0000 mg | ORAL_TABLET | Freq: Every day | ORAL | 0 refills | Status: AC

## 2016-02-07 MED ORDER — DIVALPROEX SODIUM ER 500 MG PO TB24: 1500.0000 mg | ORAL_TABLET | Freq: Every day | ORAL | 0 refills | Status: AC

## 2016-02-07 MED ORDER — SIMVASTATIN 20 MG PO TABS: 20.0000 mg | ORAL_TABLET | Freq: Every day | ORAL | Status: AC

## 2016-02-07 MED ORDER — CYANOCOBALAMIN 1000 MCG PO TABS: 1000.0000 ug | ORAL_TABLET | Freq: Every day | ORAL | Status: AC

## 2016-02-07 NOTE — Progress Notes (Signed)
  [facility] :  Will you be returning to the same living situation after discharge:  Yes,  back home to [place]. At discharge, do you have transportation home?: Yes,  daughter will pick pt up. Do you have the ability to pay for your medications: Yes,  insurance coverage.  Release of information consent forms completed and in the chart;  Patient's signature needed at discharge.  Patient to Follow up at: Follow-up Information    [facility] Go in 3 day(s).   Why:  Please use [facility] In [facility] to establish for services for medications management and therapy.  Hours are Mon - Thurs from 8:30 - 4:30. Contact information: [address]   [place], [place] [id number] Phone:  [phone]            Next level of care provider has [facility] to [facility] Link:no  Safety Planning and Suicide Prevention discussed: Yes,  SPE completed with patient and daughter.  Have you used any form of tobacco in the last 30 days? (Cigarettes, Smokeless Tobacco, Cigars, and/or Pipes): Yes  Has patient been referred to the Quitline?: Patient refused referral  Patient has been referred for addiction treatment: Pt. refused referral  Lynden OxfordKadijah R Fontella Shan, MSW, LCSW-A 02/07/2016, 9:57 AM
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
## 2016-02-07 NOTE — [facility] Suicide Risk Assessment (Signed)
[facility]   Principal Problem: Bipolar 1 disorder, mixed, severe (HCC) Discharge Diagnoses:  Patient Active Problem List   Diagnosis Date Noted  . Alcohol use disorder, severe, dependence (HCC) [F10.20] 01/20/2016  . Alcohol withdrawal (HCC) [F10.239] 01/20/2016  . Cannabis use disorder, mild, abuse [F12.10] 01/20/2016  . Tobacco use disorder [F17.200] 01/20/2016  . HTN (hypertension) [I10] 01/20/2016  . Hypothyroidism [E03.9] 01/20/2016  . Opioid use disorder, moderate, dependence (HCC) [F11.20] 01/20/2016  . Bipolar 1 disorder, mixed, severe (HCC) [F31.63] 01/19/2016    Psychiatric Specialty Exam: ROS  Blood pressure 119/63, pulse 83, temperature 98.5 F (36.9 C), temperature source Oral, resp. rate 18, height 5\' 2"  (1.575 m), weight 83 kg (182 lb 15.7 oz), SpO2 100 %.Body mass index is 33.47 kg/m.   Mental Status Per Nursing Assessment::   On Admission:     Demographic Factors:  Caucasian and Living alone  Loss Factors: NA  Historical Factors: Impulsivity  Risk Reduction Factors:   Sense of responsibility to family and Positive social support  Continued Clinical Symptoms:  Alcohol/Substance Abuse/Dependencies More than one psychiatric diagnosis Previous Psychiatric Diagnoses and Treatments  Cognitive Features That Contribute To Risk:  Polarized thinking    Suicide Risk:  Minimal: No identifiable suicidal ideation.  Patients presenting with no risk factors but with morbid ruminations; may be classified as minimal risk based on the severity of the depressive symptoms   Jimmy FootmanHernandez-Gonzalez,  Cannon Arreola, MD 02/07/2016, 7:00 AM

## 2016-02-07 NOTE — Progress Notes (Signed)
Recreation Therapy Notes  Date: 11.20.17 Time: 9:30 am Location: Craft Room  Group Topic: Self-expression  Goal Area(s) Addresses:  Patient will be able to identify a color that represents each emotion. Patient will verbalize benefit of using art as a means of self-expression. Patient will verbalize one emotion experienced while participating in activity.  Behavioral Response: Attentive, Interactive  Intervention: The Colors Within Me  Activity: Patients were given a blank face worksheet and were instructed to pick a color for each emotion they were feeling and show on the face how much of that emotion they are feeling.  Education: LRT educated patients on other forms of self-expression.  Education Outcome: Acknowledges education/In group clarification offered  Clinical Observations/Feedback: Patient drew a face and what what emotions she was feeling. Patient contributed to group discussion by stating how people view emotions.  Jacquelynn CreeGreene,Lynx Goodrich M, LRT/CTRS 02/07/2016 10:18 AM

## 2016-02-07 NOTE — Progress Notes (Signed)
Patient has been quite pleasant today. Has been medication compliant. Denies SI/HI AVH. Does not have any concerns. Still delusional. Not belligerent with staff today. States she did sleep well. Did get prn medication for complaints of back pain. Goal for the day is to maintain goals to discharge home. How she will meet her goal is to be complaint and to refrain from reacting to negative comments. Safety maintained with Q15 minute checks. Will continue to monitor.

## 2016-02-07 NOTE — Plan of Care (Signed)
Problem: Coping: Goal: Ability to cope will improve Outcome: Progressing Patient has been open with any concerns about her mental health

## 2016-02-07 NOTE — Progress Notes (Signed)
Patient discharged with transition record, suicide risk assessment, after visit summary, and patient belongings. Patient stable. Denies SI/HI upon discharge. Left ambulatory and discharged to daughter.

## 2016-04-07 DIAGNOSIS — R0789 Other chest pain: Secondary | ICD-10-CM | POA: Insufficient documentation

## 2016-04-07 DIAGNOSIS — E7801 Familial hypercholesterolemia: Secondary | ICD-10-CM | POA: Insufficient documentation

## 2016-04-07 DIAGNOSIS — I351 Nonrheumatic aortic (valve) insufficiency: Secondary | ICD-10-CM | POA: Insufficient documentation

## 2016-04-07 DIAGNOSIS — E78019 Familial hypercholesterolemia, unspecified: Secondary | ICD-10-CM | POA: Insufficient documentation

## 2016-08-31 DIAGNOSIS — R1011 Right upper quadrant pain: Secondary | ICD-10-CM | POA: Insufficient documentation

## 2016-12-06 DIAGNOSIS — M47817 Spondylosis without myelopathy or radiculopathy, lumbosacral region: Secondary | ICD-10-CM | POA: Insufficient documentation

## 2016-12-06 DIAGNOSIS — M545 Low back pain, unspecified: Secondary | ICD-10-CM | POA: Insufficient documentation

## 2016-12-06 DIAGNOSIS — G8929 Other chronic pain: Secondary | ICD-10-CM | POA: Insufficient documentation

## 2016-12-06 DIAGNOSIS — M961 Postlaminectomy syndrome, not elsewhere classified: Secondary | ICD-10-CM | POA: Insufficient documentation

## 2017-10-30 IMAGING — MR MR HEAD W/O CM
8 series · 48 of 48 positions shown · non-contrast
Comparison: None.

CLINICAL DATA: Psychosis, agitation at local motel. History of
hypertension, alcohol withdrawal, bipolar disorder.

EXAM:
MRI HEAD WITHOUT CONTRAST
TECHNIQUE: Multiplanar, multiecho pulse sequences of the brain and surrounding
structures were obtained without intravenous contrast. Coronal
diffusion weighted imaging not obtained.

[Series 2: T1 · sagittal · 5.0mm · 0.45mm/px · 3 of 25 slices shown (1 of 2)]
[im 1/25]
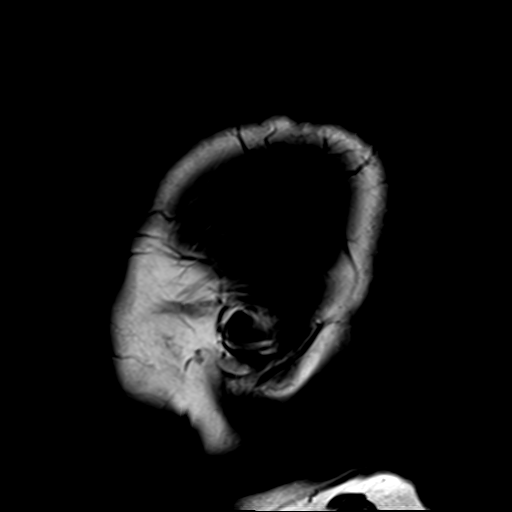
[im 13/25]
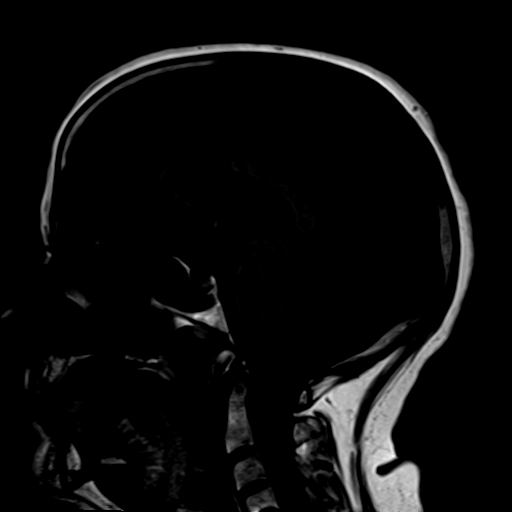
[im 25/25]
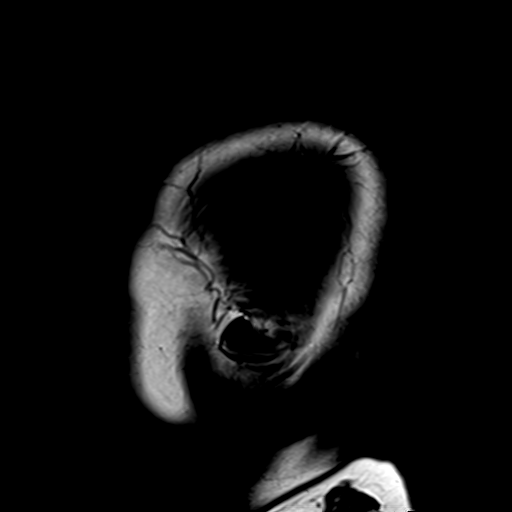

[Series 5: T2 · axial · 5.0mm · 0.60mm/px · z∈[-96,+60]mm · 4 of 25 slices shown (1 of 3)]
[im 1/25]
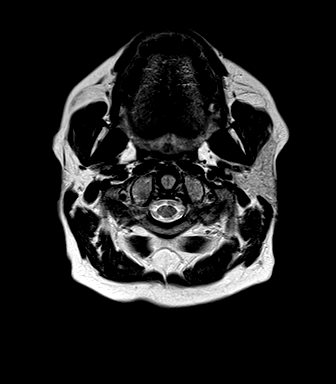
[im 9/25]
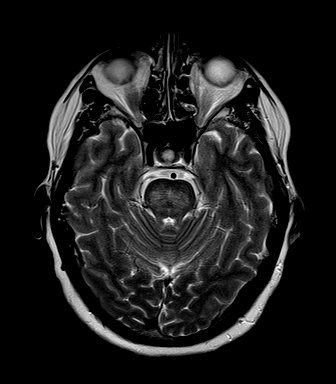
[im 17/25]
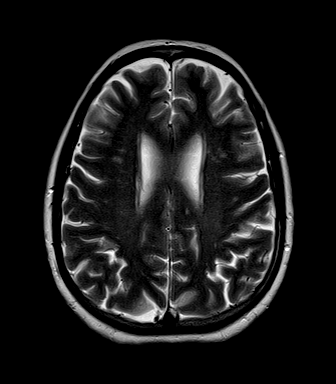
[im 25/25]
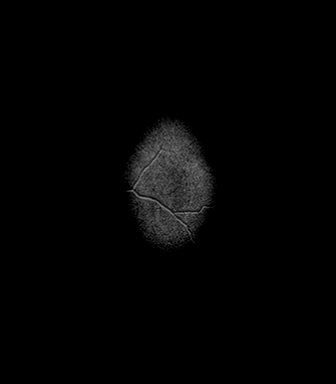

[Series 6: FLAIR · axial · 5.0mm · 0.45mm/px · z∈[-96,+60]mm · 4 of 25 slices shown]
[im 1/25]
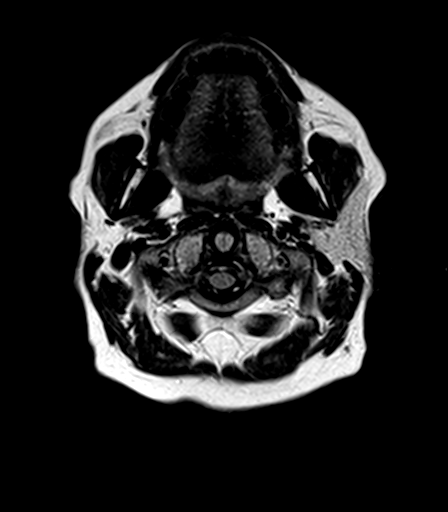
[im 9/25]
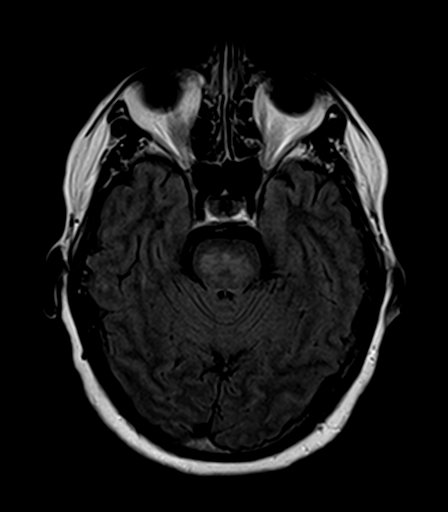
[im 17/25]
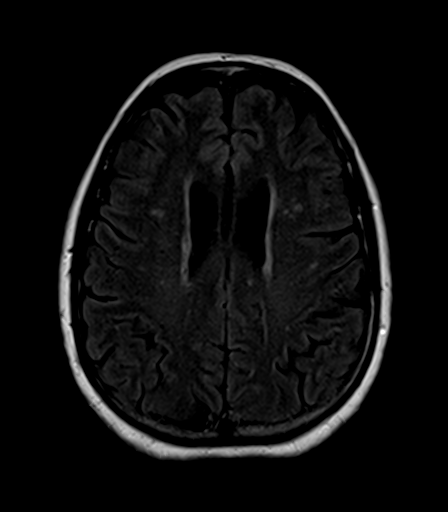
[im 25/25]
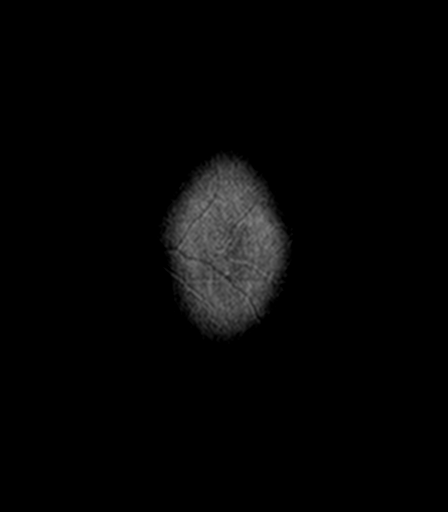

[Series 7: T2 · axial · 5.0mm · 0.45mm/px · z∈[-96,+60]mm · 4 of 25 slices shown (2 of 3)]
[im 1/25]
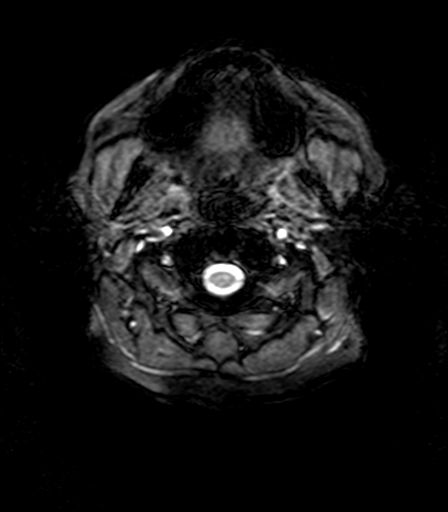
[im 9/25]
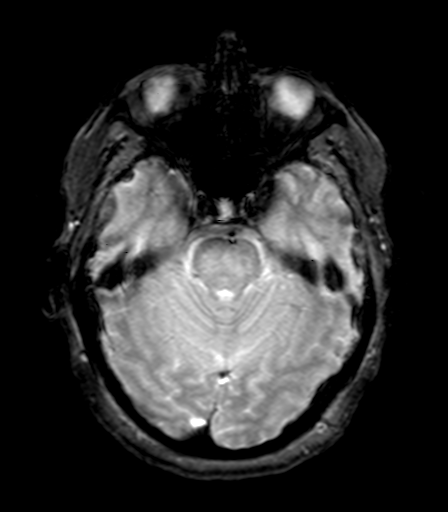
[im 17/25]
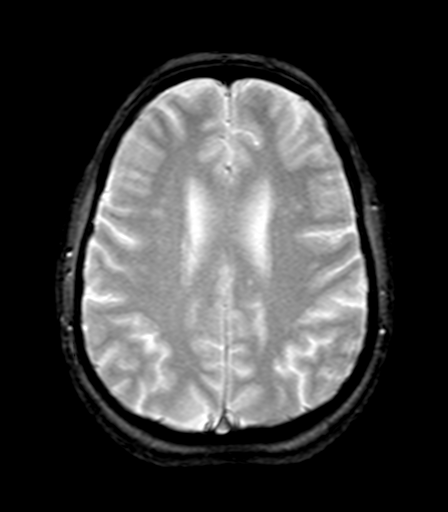
[im 25/25]
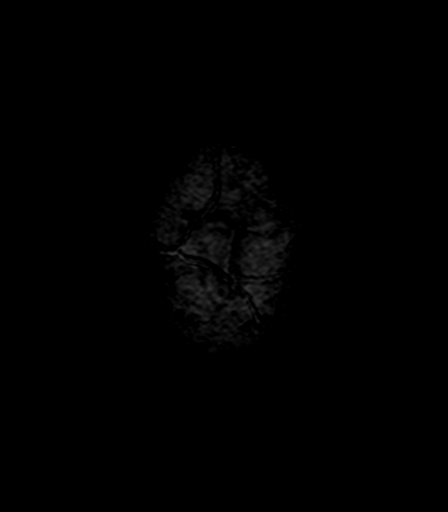

[Series 8: T1 · axial · 3.0mm · 1.00mm/px · z∈[-106,+71]mm · 10 of 60 slices shown (2 of 2)]
[im 1/60]
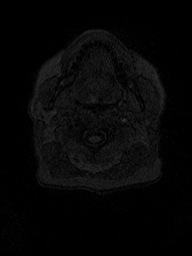
[im 7/60]
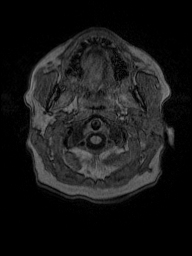
[im 14/60]
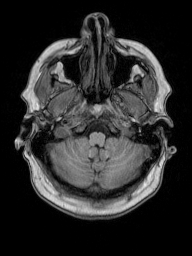
[im 20/60]
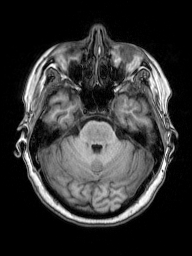
[im 27/60]
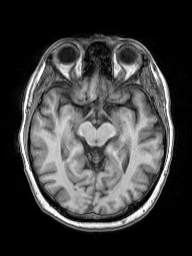
[im 33/60]
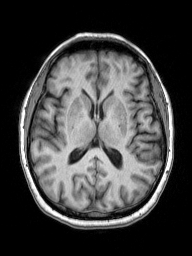
[im 40/60]
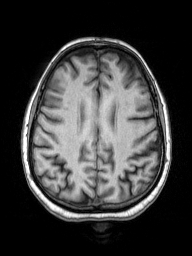
[im 46/60]
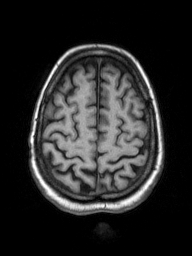
[im 53/60]
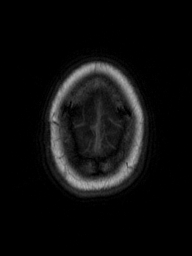
[im 60/60]
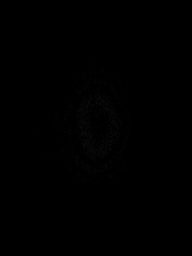

[Series 9: T2 · coronal · 5.0mm · 0.49mm/px · 5 of 30 slices shown (3 of 3)]
[im 1/30]
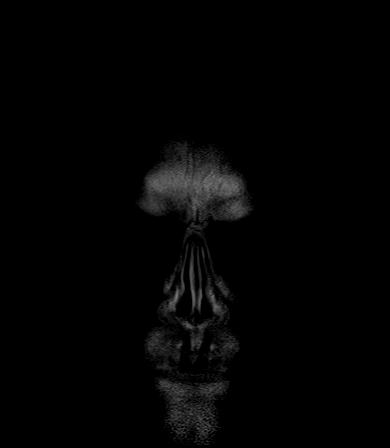
[im 8/30]
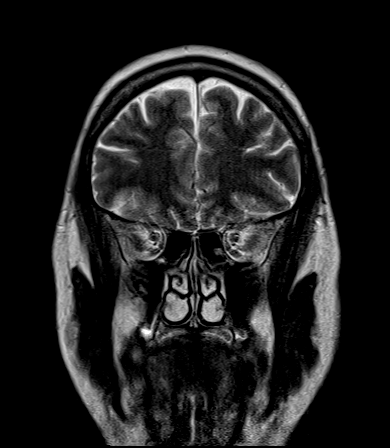
[im 15/30]
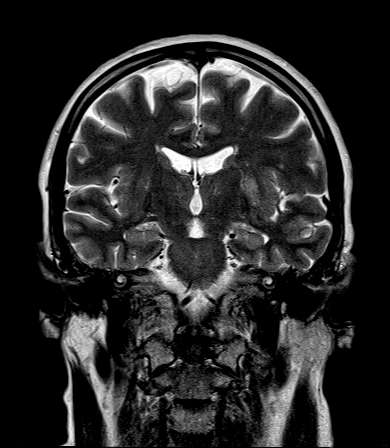
[im 22/30]
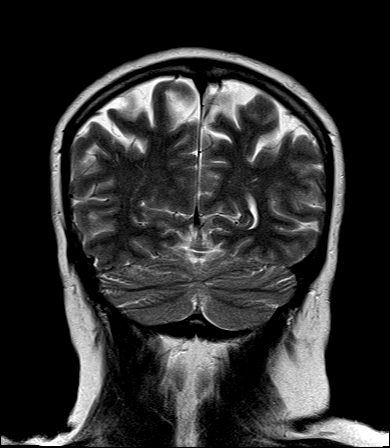
[im 30/30]
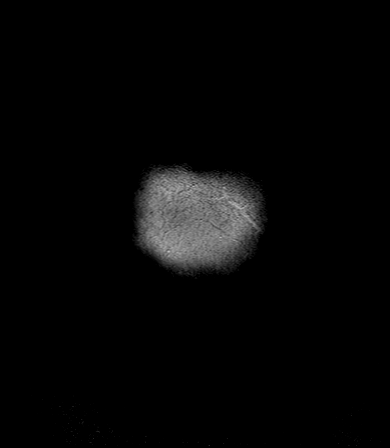

[Series 100: DWI · axial · 3.0mm · 1.80mm/px · z∈[-101,+61]mm · 9 of 54 slices shown]
[im 1/54]
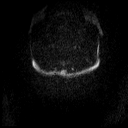
[im 7/54]
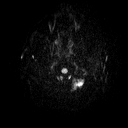
[im 14/54]
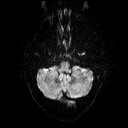
[im 20/54]
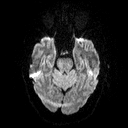
[im 27/54]
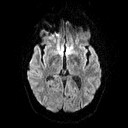
[im 34/54]
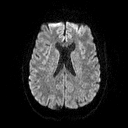
[im 40/54]
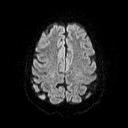
[im 47/54]
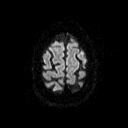
[im 54/54]
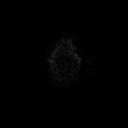

[Series 101: ADC · axial · 3.0mm · 1.80mm/px · z∈[-101,+61]mm · 9 of 54 slices shown]
[im 1/54]
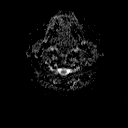
[im 7/54]
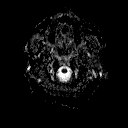
[im 14/54]
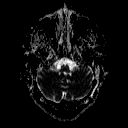
[im 20/54]
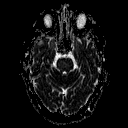
[im 27/54]
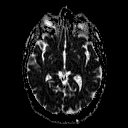
[im 34/54]
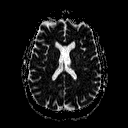
[im 40/54]
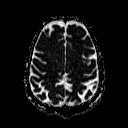
[im 47/54]
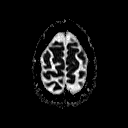
[im 54/54]
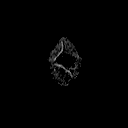

[48 of 48 positions shown; findings below may reference images not displayed]

FINDINGS: BRAIN: No reduced diffusion to suggest acute ischemia. No
susceptibility artifact to suggest hemorrhage. The ventricles and
sulci are normal for patient's age, cavum septum pellucidum.
Confluent non expansile T2 bright signal in the pons. T2 bright
signal within the bilateral globus pallidus, posterior LEFT
putaminal in the anterior RIGHT putaminal. No masses or mass effect.
No abnormal extra-axial fluid collections. No extra-axial masses
though, contrast enhanced sequences would be more sensitive.

VASCULAR: Normal major intracranial vascular flow voids present at
skull base.

SKULL AND UPPER CERVICAL SPINE: No abnormal sellar expansion. No
suspicious calvarial bone marrow signal. Craniocervical junction
maintained.

SINUSES/ORBITS: The bilateral mastoid effusions. Trace paranasal
sinus mucosal thickening without air-fluid levels. The included
ocular globes and orbital contents are non-suspicious.

OTHER: None.
IMPRESSION: No acute intracranial process.

T2 bright signal bilateral deep gray nuclei, pons which can be seen
with chronic ischemia and/or toxic/metabolic insult.

## 2017-11-12 ENCOUNTER — Emergency Department
Admission: EM | Admit: 2017-11-12 | Discharge: 2017-11-12 | Disposition: A | Discharge status: Home / Self Care | Payer: Medicare Other | Attending: Emergency Medicine | Admitting: Emergency Medicine

## 2017-11-12 ENCOUNTER — Other Ambulatory Visit: Payer: Self-pay

## 2017-11-12 ENCOUNTER — Encounter: Payer: Self-pay | Admitting: Emergency Medicine

## 2017-11-12 DIAGNOSIS — L02211 Cutaneous abscess of abdominal wall: Secondary | ICD-10-CM | POA: Diagnosis not present

## 2017-11-12 DIAGNOSIS — Z79899 Other long term (current) drug therapy: Secondary | ICD-10-CM | POA: Diagnosis not present

## 2017-11-12 DIAGNOSIS — Z7982 Long term (current) use of aspirin: Secondary | ICD-10-CM | POA: Insufficient documentation

## 2017-11-12 DIAGNOSIS — E039 Hypothyroidism, unspecified: Secondary | ICD-10-CM | POA: Diagnosis not present

## 2017-11-12 DIAGNOSIS — I10 Essential (primary) hypertension: Secondary | ICD-10-CM | POA: Insufficient documentation

## 2017-11-12 DIAGNOSIS — F17210 Nicotine dependence, cigarettes, uncomplicated: Secondary | ICD-10-CM | POA: Insufficient documentation

## 2017-11-12 DIAGNOSIS — L0291 Cutaneous abscess, unspecified: Secondary | ICD-10-CM

## 2017-11-12 DIAGNOSIS — R222 Localized swelling, mass and lump, trunk: Secondary | ICD-10-CM | POA: Diagnosis present

## 2017-11-12 MED ORDER — LIDOCAINE HCL (PF) 1 % IJ SOLN
INTRAMUSCULAR | Status: AC
  Administered 2017-11-12: 5 mL
  Filled 2017-11-12: qty 5

## 2017-11-12 MED ORDER — LIDOCAINE HCL 1 % IJ SOLN
5.0000 mL | Freq: Once | INTRAMUSCULAR | Status: AC
Start: 2017-11-12 — End: 2017-11-12
  Administered 2017-11-12: 5 mL
  Filled 2017-11-12: qty 5

## 2017-11-12 MED ORDER — SULFAMETHOXAZOLE-TRIMETHOPRIM 800-160 MG PO TABS: 1.0000 | ORAL_TABLET | Freq: Two times a day (BID) | ORAL | 0 refills | Status: AC

## 2017-11-12 NOTE — ED Provider Notes (Signed)
[facility] Emergency Department Provider Note  ____________________________________________  Time seen: Approximately 4:17 PM  I have reviewed the triage vital signs and the nursing notes.   HISTORY  Chief Complaint Abscess    HPI Catherine Lester is a 52 y.o. female presents to the emergency department with a left-sided suprapubic abscess that has been apparent for approximately 4 to 5 days.  Patient has a history of cutaneous abscesses.  She denies fever, chills, nausea and vomiting.  No alleviating measures have been attempted.   Past Medical History:  Diagnosis Date  . Anxiety   . Hx of heart valve insufficiency   . Hypertension   . Thyroid disease   . Urinary incontinence     Patient Active Problem List   Diagnosis Date Noted  . Schizoaffective disorder, bipolar type (HCC) 02/07/2016  . Alcohol use disorder, severe, dependence (HCC) 01/20/2016  . Alcohol withdrawal (HCC) 01/20/2016  . Cannabis use disorder, mild, abuse 01/20/2016  . Tobacco use disorder 01/20/2016  . HTN (hypertension) 01/20/2016  . Hypothyroidism 01/20/2016  . Opioid use disorder, moderate, dependence (HCC) 01/20/2016    Past Surgical History:  Procedure Laterality Date  . ANTERIOR FUSION CERVICAL SPINE     C5, C6  . BACK SURGERY    . TONSILLECTOMY    . TUBAL LIGATION      Prior to Admission medications   Medication Sig Start Date End Date Taking? Authorizing Provider  aspirin 81 MG chewable tablet Chew by mouth daily.   Yes [provider]  citalopram (CELEXA) 20 MG tablet Take 20 mg by mouth daily.   Yes [provider]  levothyroxine (SYNTHROID, LEVOTHROID) 125 MCG tablet Take 125 mcg by mouth daily before breakfast.   Yes [provider]  lisinopril (PRINIVIL,ZESTRIL) 20 MG tablet Take 20 mg by mouth daily.   Yes [provider]  simvastatin (ZOCOR) 20 MG tablet Take 1 tablet (20 mg total) by mouth daily at 6 PM. 02/07/16  Yes  Jimmy FootmanHernandez-Gonzalez, Andrea, MD  divalproex (DEPAKOTE ER) 500 MG 24 hr tablet Take 3 tablets (1,500 mg total) by mouth at bedtime. 02/07/16   Jimmy FootmanHernandez-Gonzalez, Andrea, MD  OLANZapine (ZYPREXA) 15 MG tablet Take 2 tablets (30 mg total) by mouth at bedtime. 02/07/16   Jimmy FootmanHernandez-Gonzalez, Andrea, MD  sulfamethoxazole-trimethoprim (BACTRIM DS,SEPTRA DS) 800-160 MG tablet Take 1 tablet by mouth 2 (two) times daily for 7 days. 11/12/17 11/19/17  Orvil FeilWoods, Venessa Wickham M, PA-C  vitamin B-12 1000 MCG tablet Take 1 tablet (1,000 mcg total) by mouth daily. 02/07/16   Jimmy FootmanHernandez-Gonzalez, Andrea, MD    Allergies Ancef [cefazolin]; Ceclor [cefaclor]; Demerol [meperidine]; and Keflex [cephalexin]  No family history on file.  Social History Social History   Tobacco Use  . Smoking status: Current Every Day Smoker    Packs/day: 1.50    Types: Cigarettes  . Smokeless tobacco: Never Used  Substance Use Topics  . Alcohol use: Yes    Comment: 3-4 times weekly   . Drug use: Yes    Types: Marijuana     Review of Systems  Constitutional: No fever/chills Eyes: No visual changes. No discharge ENT: No upper respiratory complaints. Cardiovascular: no chest pain. Respiratory: no cough. No SOB. Gastrointestinal: No abdominal pain.  No nausea, no vomiting.  No diarrhea.  No constipation. Musculoskeletal: Negative for musculoskeletal pain. Skin: Patient has an abscess.  Neurological: Negative for headaches, focal weakness or numbness.   ____________________________________________   PHYSICAL EXAM:  VITAL SIGNS: ED Triage Vitals  Enc Vitals  Group     BP 11/12/17 1415 114/73     Pulse Rate 11/12/17 1415 89     Resp 11/12/17 1415 18     Temp 11/12/17 1415 99.2 F (37.3 C)     Temp Source 11/12/17 1415 Oral     SpO2 11/12/17 1415 98 %     Weight 11/12/17 1417 145 lb (65.8 kg)     Height 11/12/17 1417 5\' 2"  (1.575 m)     Head Circumference --      Peak Flow --      Pain Score 11/12/17 1416 10     Pain  Loc --      Pain Edu? --      Excl. in GC? --      Constitutional: Alert and oriented. Well appearing and in no acute distress. Eyes: Conjunctivae are normal. PERRL. EOMI. Head: Atraumatic. Cardiovascular: Normal rate, regular rhythm. Normal S1 and S2.  Good peripheral circulation. Respiratory: Normal respiratory effort without tachypnea or retractions. Lungs CTAB. Good air entry to the bases with no decreased or absent breath sounds. Musculoskeletal: Full range of motion to all extremities. No gross deformities appreciated. Neurologic:  Normal speech and language. No gross focal neurologic deficits are appreciated.  Skin: Patient has a 2 cm x 2 cm left-sided suprapubic abscess that is fluctuant and indurated with approximately 1-1/2 cm of surrounding cellulitis. Psychiatric: Mood and affect are normal. Speech and behavior are normal. Patient exhibits appropriate insight and judgement.   ____________________________________________   LABS (all labs ordered are listed, but only abnormal results are displayed)  Labs Reviewed - No data to display ____________________________________________  EKG   ____________________________________________  RADIOLOGY   No results found.  ____________________________________________    PROCEDURES  Procedure(s) performed:    Procedures  INCISION AND DRAINAGE Performed by: Orvil Feil Consent: Verbal consent obtained. Risks and benefits: risks, benefits and alternatives were discussed Type: abscess  Body area: Left sided suprapubic region.   Anesthesia: local infiltration  Incision was made with a scalpel.  Local anesthetic: lidocaine 1% without epinephrine  Anesthetic total: 3 ml  Complexity: complex Blunt dissection to break up loculations  Drainage: purulent  Drainage amount: Copious   Packing material: None  Patient tolerance: Patient tolerated the procedure well with no immediate  complications.     Medications  lidocaine (XYLOCAINE) 1 % (with pres) injection 5 mL (5 mLs Infiltration Given by Other 11/12/17 1615)     ____________________________________________   INITIAL IMPRESSION / ASSESSMENT AND PLAN / ED COURSE  Pertinent labs & imaging results that were available during my care of the patient were reviewed by me and considered in my medical decision making (see chart for details).  Review of the [place] CSRS was performed in accordance of the [facility] prior to dispensing any controlled drugs.      Assessment and plan Abscess Patient presents to the emergency department with a left-sided suprapubic abscess.  Patient underwent incision and drainage in the emergency department and she was discharged with Bactrim.  Patient was advised to follow-up with primary care as needed.  All patient questions were answered.    ____________________________________________  FINAL CLINICAL IMPRESSION(S) / ED DIAGNOSES  Final diagnoses:  Abscess      NEW MEDICATIONS STARTED DURING THIS VISIT:  ED Discharge Orders         Ordered    sulfamethoxazole-trimethoprim (BACTRIM DS,SEPTRA DS) 800-160 MG tablet  2 times daily     11/12/17 1537  This chart was dictated using voice recognition software/Dragon. Despite best efforts to proofread, errors can occur which can change the meaning. Any change was purely unintentional.    Orvil Feil, PA-C 11/12/17 1622    Don Perking, [place], MD 11/15/17 [phone]

## 2017-11-12 NOTE — ED Notes (Signed)
Says has had an abscess at bikini line.   Trying to use a drying cream and hot complresses, but then it got larger and is now size of golfball. Has not felt sick, but has not checked temp either.

## 2017-11-12 NOTE — ED Triage Notes (Signed)
Abscess L groin x 5 days

## 2018-01-01 ENCOUNTER — Ambulatory Visit: Payer: Medicare Other | Attending: Obstetrics and Gynecology

## 2018-04-04 DIAGNOSIS — N3946 Mixed incontinence: Secondary | ICD-10-CM | POA: Insufficient documentation

## 2018-11-05 ENCOUNTER — Emergency Department
Admission: EM | Admit: 2018-11-05 | Discharge: 2018-11-05 | Disposition: A | Discharge status: Home / Self Care | Payer: Medicare Other | Attending: Emergency Medicine | Admitting: Emergency Medicine

## 2018-11-05 ENCOUNTER — Other Ambulatory Visit: Payer: Self-pay

## 2018-11-05 ENCOUNTER — Encounter: Payer: Self-pay | Admitting: Medical Oncology

## 2018-11-05 ENCOUNTER — Emergency Department: Payer: Medicare Other

## 2018-11-05 DIAGNOSIS — E039 Hypothyroidism, unspecified: Secondary | ICD-10-CM | POA: Diagnosis not present

## 2018-11-05 DIAGNOSIS — Z20828 Contact with and (suspected) exposure to other viral communicable diseases: Secondary | ICD-10-CM | POA: Insufficient documentation

## 2018-11-05 DIAGNOSIS — Z7982 Long term (current) use of aspirin: Secondary | ICD-10-CM | POA: Insufficient documentation

## 2018-11-05 DIAGNOSIS — I10 Essential (primary) hypertension: Secondary | ICD-10-CM | POA: Insufficient documentation

## 2018-11-05 DIAGNOSIS — R070 Pain in throat: Secondary | ICD-10-CM | POA: Diagnosis present

## 2018-11-05 DIAGNOSIS — Z79899 Other long term (current) drug therapy: Secondary | ICD-10-CM | POA: Diagnosis not present

## 2018-11-05 DIAGNOSIS — J029 Acute pharyngitis, unspecified: Secondary | ICD-10-CM

## 2018-11-05 DIAGNOSIS — R05 Cough: Secondary | ICD-10-CM

## 2018-11-05 DIAGNOSIS — R059 Cough, unspecified: Secondary | ICD-10-CM

## 2018-11-05 DIAGNOSIS — J069 Acute upper respiratory infection, unspecified: Secondary | ICD-10-CM

## 2018-11-05 DIAGNOSIS — F17210 Nicotine dependence, cigarettes, uncomplicated: Secondary | ICD-10-CM | POA: Insufficient documentation

## 2018-11-05 LAB — GROUP A STREP BY PCR: Group A Strep by PCR: NOT DETECTED

## 2018-11-05 LAB — SARS CORONAVIRUS 2 BY RT PCR (HOSPITAL ORDER, PERFORMED IN ~~LOC~~ HOSPITAL LAB): SARS Coronavirus 2: NEGATIVE

## 2018-11-05 MED ORDER — FEXOFENADINE-PSEUDOEPHED ER 60-120 MG PO TB12: 1.0000 | ORAL_TABLET | Freq: Two times a day (BID) | ORAL | 0 refills | Status: AC

## 2018-11-05 MED ORDER — NAPROXEN 500 MG PO TABS: 500.0000 mg | ORAL_TABLET | Freq: Two times a day (BID) | ORAL | Status: AC

## 2018-11-05 MED ORDER — BENZONATATE 100 MG PO CAPS
200.0000 mg | ORAL_CAPSULE | Freq: Once | ORAL | Status: AC
  Administered 2018-11-05: 200 mg via ORAL
  Filled 2018-11-05: qty 2

## 2018-11-05 MED ORDER — BENZONATATE 100 MG PO CAPS: 200.0000 mg | ORAL_CAPSULE | Freq: Three times a day (TID) | ORAL | 0 refills | Status: AC | PRN

## 2018-11-05 MED ORDER — HYDROCOD POLST-CPM POLST ER 10-8 MG/5ML PO SUER: 5.0000 mL | Freq: Two times a day (BID) | ORAL | 0 refills | Status: AC

## 2018-11-05 MED ORDER — NAPROXEN 500 MG PO TABS
500.0000 mg | ORAL_TABLET | Freq: Once | ORAL | Status: AC
  Administered 2018-11-05: 500 mg via ORAL
  Filled 2018-11-05: qty 1

## 2018-11-05 NOTE — ED Provider Notes (Signed)
[facility] Emergency Department Provider Note   ____________________________________________   First MD Initiated Contact with Patient 11/05/18 1053     (approximate)  I have reviewed the triage vital signs and the nursing notes.   HISTORY  Chief Complaint Sore Throat, Headache, Shortness of Breath, and Cough    HPI Catherine Lester is a 53 y.o. female patient complain of sore throat, headache, shortness of breath, and nonproductive cough for 1 week.  Patient states yesterday she was told her significant other was positive for COVID.  Patient denies nausea, vomiting, diarrhea.  Patient also complained of body aches and clogged ears.         Past Medical History:  Diagnosis Date  . Anxiety   . Hx of heart valve insufficiency   . Hypertension   . Thyroid disease   . Urinary incontinence     Patient Active Problem List   Diagnosis Date Noted  . Schizoaffective disorder, bipolar type (HCC) 02/07/2016  . Alcohol use disorder, severe, dependence (HCC) 01/20/2016  . Alcohol withdrawal (HCC) 01/20/2016  . Cannabis use disorder, mild, abuse 01/20/2016  . Tobacco use disorder 01/20/2016  . HTN (hypertension) 01/20/2016  . Hypothyroidism 01/20/2016  . Opioid use disorder, moderate, dependence (HCC) 01/20/2016    Past Surgical History:  Procedure Laterality Date  . ANTERIOR FUSION CERVICAL SPINE     C5, C6  . BACK SURGERY    . TONSILLECTOMY    . TUBAL LIGATION      Prior to Admission medications   Medication Sig Start Date End Date Taking? Authorizing Provider  aspirin 81 MG chewable tablet Chew by mouth daily.    [provider]  benzonatate (TESSALON PERLES) 100 MG capsule Take 2 capsules (200 mg total) by mouth 3 (three) times daily as needed. 11/05/18 11/05/19  Joni ReiningSmith, Rickard Kennerly K, PA-C  citalopram (CELEXA) 20 MG tablet Take 20 mg by mouth daily.    [provider]  divalproex (DEPAKOTE ER) 500 MG 24 hr tablet Take 3 tablets  (1,500 mg total) by mouth at bedtime. 02/07/16   Jimmy FootmanHernandez-Gonzalez, Andrea, MD  fexofenadine-pseudoephedrine (ALLEGRA-D) 60-120 MG 12 hr tablet Take 1 tablet by mouth 2 (two) times daily. 11/05/18   Joni ReiningSmith, Yarithza Mink K, PA-C  levothyroxine (SYNTHROID, LEVOTHROID) 125 MCG tablet Take 125 mcg by mouth daily before breakfast.    [provider]  lisinopril (PRINIVIL,ZESTRIL) 20 MG tablet Take 20 mg by mouth daily.    [provider]  naproxen (NAPROSYN) 500 MG tablet Take 1 tablet (500 mg total) by mouth 2 (two) times daily with a meal. 11/05/18   Joni ReiningSmith, Rockne Dearinger K, PA-C  OLANZapine (ZYPREXA) 15 MG tablet Take 2 tablets (30 mg total) by mouth at bedtime. 02/07/16   Jimmy FootmanHernandez-Gonzalez, Andrea, MD  simvastatin (ZOCOR) 20 MG tablet Take 1 tablet (20 mg total) by mouth daily at 6 PM. 02/07/16   Jimmy FootmanHernandez-Gonzalez, Andrea, MD  vitamin B-12 1000 MCG tablet Take 1 tablet (1,000 mcg total) by mouth daily. 02/07/16   Jimmy FootmanHernandez-Gonzalez, Andrea, MD    Allergies Ancef [cefazolin], Ceclor [cefaclor], Demerol [meperidine], and Keflex [cephalexin]  No family history on file.  Social History Social History   Tobacco Use  . Smoking status: Current Every Day Smoker    Packs/day: 1.50    Types: Cigarettes  . Smokeless tobacco: Never Used  Substance Use Topics  . Alcohol use: Yes    Comment: 3-4 times weekly   . Drug use: Yes    Types: Marijuana  Review of Systems Constitutional: Fever/chills and body aches.   Eyes: No visual changes. ENT: No sore throat. Cardiovascular: Denies chest pain. Respiratory: States shortness of breath.  Nonproductive cough. Gastrointestinal: No abdominal pain.  No nausea, no vomiting.  No diarrhea.  No constipation. Genitourinary: Negative for dysuria. Musculoskeletal: Negative for back pain. Skin: Negative for rash. Neurological: Negative for headaches, focal weakness or numbness. Psychiatric:  Anxiety, alcohol or drug abuse, and bipolar. Endocrine:   Allergic/Immunilogical: See medication list.  ____________________________________________   PHYSICAL EXAM:  VITAL SIGNS: ED Triage Vitals  Enc Vitals Group     BP 11/05/18 1041 (!) 163/73     Pulse Rate 11/05/18 1041 81     Resp 11/05/18 1041 (!) 21     Temp 11/05/18 1041 99.5 F (37.5 C)     Temp Source 11/05/18 1041 Oral     SpO2 11/05/18 1041 100 %     Weight 11/05/18 1042 145 lb (65.8 kg)     Height 11/05/18 1042 5\' 2"  (1.575 m)     Head Circumference --      Peak Flow --      Pain Score 11/05/18 1042 8     Pain Loc --      Pain Edu? --      Excl. in GC? --    Constitutional: Alert and oriented. Well appearing and in no acute distress. Nose: Edematous nasal turbinates clear rhinorrhea. Mouth/Throat: Mucous membranes are moist.  Oropharynx non-erythematous. Neck: No stridor.   Hematological/Lymphatic/Immunilogical: No cervical lymphadenopathy. Cardiovascular: Normal rate, regular rhythm. Grossly normal heart sounds.  Good peripheral circulation. Respiratory: Normal respiratory effort.  No retractions. Lungs CTAB. Neurologic:  Normal speech and language. No gross focal neurologic deficits are appreciated. No gait instability. Skin:  Skin is warm, dry and intact. No rash noted. Psychiatric: Mood and affect are normal. Speech and behavior are normal.  ____________________________________________   LABS (all labs ordered are listed, but only abnormal results are displayed)  Labs Reviewed  SARS CORONAVIRUS 2 (HOSPITAL ORDER, PERFORMED IN [facility])  GROUP A STREP BY PCR   ____________________________________________  EKG   ____________________________________________  RADIOLOGY  ED MD interpretation:    Official radiology report(s): Dg Chest 1 View  Result Date: 11/05/2018 CLINICAL DATA:  Productive cough EXAM: CHEST  1 VIEW COMPARISON:  None. FINDINGS: Heart and mediastinal contours are within normal limits. No focal opacities or effusions.  No acute bony abnormality. IMPRESSION: No active disease. Electronically Signed   By: Charlett NoseKevin  Dover M.D.   On: 11/05/2018 11:49    ____________________________________________   PROCEDURES  Procedure(s) performed (including Critical Care):  Procedures   ____________________________________________   INITIAL IMPRESSION / ASSESSMENT AND PLAN / ED COURSE  As part of my medical decision making, I reviewed the following data within the electronic MEDICAL RECORD NUMBER     Catherine Lester was evaluated in Emergency Department on 11/05/2018 for the symptoms described in the history of present illness. She was evaluated in the context of the global COVID-19 pandemic, which necessitated consideration that the patient might be at risk for infection with the SARS-CoV-2 virus that causes COVID-19. Institutional protocols and algorithms that pertain to the evaluation of patients at risk for COVID-19 are in a state of rapid change based on information released by regulatory bodies including the CDC and federal and state organizations. These policies and algorithms were followed during the patient's care in the ED.  Patient presents with 1 week of sore throat, headache, cough.  Patient states positive [place] exposure secondary to her significant other tested positive yesterday.  Patient physical exam is consistent with viral respiratory infection.  Patient strep test, COVID test, and chest x-ray is unremarkable.  Patient given discharge care instructions and a prescription for Tessalon Perles, Allegra-D, and naproxen.  Patient advised follow-up PCP.         ____________________________________________   FINAL CLINICAL IMPRESSION(S) / ED DIAGNOSES  Final diagnoses:  Viral upper respiratory tract infection  Cough  Sore throat     ED Discharge Orders         Ordered    benzonatate (TESSALON PERLES) 100 MG capsule  3 times daily PRN     11/05/18 1304    naproxen (NAPROSYN) 500 MG tablet  2 times  daily with meals     11/05/18 1304    fexofenadine-pseudoephedrine (ALLEGRA-D) 60-120 MG 12 hr tablet  2 times daily     11/05/18 1304           Note:  This document was prepared using Dragon voice recognition software and may include unintentional dictation errors.    Sable Feil, PA-C 11/05/18 1306    Schuyler Amor, MD 11/15/18 1215

## 2018-11-05 NOTE — Discharge Instructions (Addendum)
Your COVID and rapid strep test were negative.  No acute findings on chest x-ray.  Follow discharge care instruction take medication as directed.

## 2018-11-05 NOTE — ED Triage Notes (Signed)
Pt reports that she has only been around 1 person in the last week and that person tested positive yesterday for COVID. Pt reports sore throat, clogged ears, headache, cough x 1 week.

## 2018-11-05 NOTE — ED Notes (Signed)
See triage note  Presents with sore throat and cough  Low grade fever  States she has been exposed to COVID last week

## 2018-11-20 ENCOUNTER — Encounter: Payer: Self-pay | Admitting: Emergency Medicine

## 2018-11-20 ENCOUNTER — Other Ambulatory Visit: Payer: Self-pay

## 2018-11-20 ENCOUNTER — Emergency Department
Admission: EM | Admit: 2018-11-20 | Discharge: 2018-11-20 | Disposition: A | Discharge status: Home / Self Care | Payer: Medicare Other | Attending: Emergency Medicine | Admitting: Emergency Medicine

## 2018-11-20 DIAGNOSIS — J0100 Acute maxillary sinusitis, unspecified: Secondary | ICD-10-CM | POA: Diagnosis not present

## 2018-11-20 DIAGNOSIS — F17210 Nicotine dependence, cigarettes, uncomplicated: Secondary | ICD-10-CM | POA: Diagnosis not present

## 2018-11-20 DIAGNOSIS — I10 Essential (primary) hypertension: Secondary | ICD-10-CM | POA: Diagnosis not present

## 2018-11-20 DIAGNOSIS — Z79899 Other long term (current) drug therapy: Secondary | ICD-10-CM | POA: Diagnosis not present

## 2018-11-20 DIAGNOSIS — H6503 Acute serous otitis media, bilateral: Secondary | ICD-10-CM | POA: Insufficient documentation

## 2018-11-20 DIAGNOSIS — H6523 Chronic serous otitis media, bilateral: Secondary | ICD-10-CM

## 2018-11-20 DIAGNOSIS — H9213 Otorrhea, bilateral: Secondary | ICD-10-CM | POA: Diagnosis present

## 2018-11-20 DIAGNOSIS — E039 Hypothyroidism, unspecified: Secondary | ICD-10-CM | POA: Insufficient documentation

## 2018-11-20 DIAGNOSIS — Z7982 Long term (current) use of aspirin: Secondary | ICD-10-CM | POA: Diagnosis not present

## 2018-11-20 MED ORDER — DEXAMETHASONE SODIUM PHOSPHATE 10 MG/ML IJ SOLN
10.0000 mg | Freq: Once | INTRAMUSCULAR | Status: AC
  Administered 2018-11-20: 10 mg via INTRAMUSCULAR
  Filled 2018-11-20: qty 1

## 2018-11-20 MED ORDER — PREDNISONE 20 MG PO TABS: 20.0000 mg | ORAL_TABLET | Freq: Two times a day (BID) | ORAL | 0 refills | Status: AC

## 2018-11-20 NOTE — ED Notes (Signed)
Patient ambulatory to lobby with steady gait and NAD noted. Verbalized understanding of discharge instructions and follow-up care.  

## 2018-11-20 NOTE — Discharge Instructions (Addendum)
Your exam is consistent with serous otitis media. You should take the steroid as directed and continue with your previously prescribed meds. Follow-up with your provider or return as needed.

## 2018-11-20 NOTE — ED Notes (Signed)
Patient reports feeling head and nasal congestion x3 weeks. Reports at beginning of symptoms she had pain in both ears but used some drops which took the pain away. Patient states most sputum produced is clear, however occasionally she will cough up "sick looking and tasting" sputum.

## 2018-11-20 NOTE — ED Triage Notes (Signed)
Pt here with c/o congestion in bilateral ears, states its making it hard to hear. NAD. Sinus congestion also.

## 2018-11-20 NOTE — ED Provider Notes (Signed)
[facility] Emergency Department Provider Note ____________________________________________  Time seen: 1244  I have reviewed the triage vital signs and the nursing notes.  HISTORY  Chief Complaint  Ear Drainage  HPI Catherine Lester is a 53 y.o. female presents to the ED for evaluation of fullness and congestion to the ears bilaterally.  She describes some difficulty hearing but denies any headache, nausea, vomiting, vertigo, or tinnitus.  She is currently treating with Allegra-D, Flonase intermittently, and over-the-counter [place] cough syrup.  She is also reporting that she was tested last 3 weeks for COVID-19, and that report was negative.  She just presents now for persistent symptoms of sinus congestion and ear fullness.  Past Medical History:  Diagnosis Date  . Anxiety   . Hx of heart valve insufficiency   . Hypertension   . Thyroid disease   . Urinary incontinence     Patient Active Problem List   Diagnosis Date Noted  . Schizoaffective disorder, bipolar type (HCC) 02/07/2016  . Alcohol use disorder, severe, dependence (HCC) 01/20/2016  . Alcohol withdrawal (HCC) 01/20/2016  . Cannabis use disorder, mild, abuse 01/20/2016  . Tobacco use disorder 01/20/2016  . HTN (hypertension) 01/20/2016  . Hypothyroidism 01/20/2016  . Opioid use disorder, moderate, dependence (HCC) 01/20/2016    Past Surgical History:  Procedure Laterality Date  . ANTERIOR FUSION CERVICAL SPINE     C5, C6  . BACK SURGERY    . TONSILLECTOMY    . TUBAL LIGATION      Prior to Admission medications   Medication Sig Start Date End Date Taking? Authorizing Provider  aspirin 81 MG chewable tablet Chew by mouth daily.    [provider]  benzonatate (TESSALON PERLES) 100 MG capsule Take 2 capsules (200 mg total) by mouth 3 (three) times daily as needed. 11/05/18 11/05/19  [name], PA-C  chlorpheniramine-HYDROcodone (TUSSIONEX PENNKINETIC ER) 10-8 MG/5ML SUER Take  5 mLs by mouth 2 (two) times daily. 11/05/18   [name], PA-C  citalopram (CELEXA) 20 MG tablet Take 20 mg by mouth daily.    [provider]  divalproex (DEPAKOTE ER) 500 MG 24 hr tablet Take 3 tablets (1,500 mg total) by mouth at bedtime. 02/07/16   Jimmy Footman, MD  fexofenadine-pseudoephedrine (ALLEGRA-D) 60-120 MG 12 hr tablet Take 1 tablet by mouth 2 (two) times daily. 11/05/18   [name], PA-C  levothyroxine (SYNTHROID, LEVOTHROID) 125 MCG tablet Take 125 mcg by mouth daily before breakfast.    [provider]  lisinopril (PRINIVIL,ZESTRIL) 20 MG tablet Take 20 mg by mouth daily.    [provider]  naproxen (NAPROSYN) 500 MG tablet Take 1 tablet (500 mg total) by mouth 2 (two) times daily with a meal. 11/05/18   [name], PA-C  OLANZapine (ZYPREXA) 15 MG tablet Take 2 tablets (30 mg total) by mouth at bedtime. 02/07/16   Jimmy Footman, MD  predniSONE (DELTASONE) 20 MG tablet Take 1 tablet (20 mg total) by mouth 2 (two) times daily with a meal for 5 days. 11/20/18 11/25/18  Sandford Diop, Charlesetta Ivory, PA-C  simvastatin (ZOCOR) 20 MG tablet Take 1 tablet (20 mg total) by mouth daily at 6 PM. 02/07/16   Jimmy Footman, MD  vitamin B-12 1000 MCG tablet Take 1 tablet (1,000 mcg total) by mouth daily. 02/07/16   Jimmy Footman, MD    Allergies Ancef [cefazolin], Ceclor [cefaclor], Demerol [meperidine], and Keflex [cephalexin]  No family history on file.  Social History Social  History   Tobacco Use  . Smoking status: Current Every Day Smoker    Packs/day: 1.50    Types: Cigarettes  . Smokeless tobacco: Never Used  Substance Use Topics  . Alcohol use: Yes    Comment: 3-4 times weekly   . Drug use: Yes    Types: Marijuana    Review of Systems  Constitutional: Negative for fever. Eyes: Negative for visual changes. ENT: Negative for sore throat.  Reports sinus congestion and ear fullness as  above Cardiovascular: Negative for chest pain. Respiratory: Negative for shortness of breath. Gastrointestinal: Negative for abdominal pain, vomiting and diarrhea. Genitourinary: Negative for dysuria. Musculoskeletal: Negative for back pain. Skin: Negative for rash. Neurological: Negative for headaches, focal weakness or numbness. ____________________________________________  PHYSICAL EXAM:  VITAL SIGNS: ED Triage Vitals  Enc Vitals Group     BP 11/20/18 1211 (!) 147/77     Pulse Rate 11/20/18 1211 84     Resp 11/20/18 1211 18     Temp 11/20/18 1211 98.8 F (37.1 C)     Temp Source 11/20/18 1211 Oral     SpO2 11/20/18 1211 100 %     Weight 11/20/18 1210 145 lb (65.8 kg)     Height 11/20/18 1210 5\' 2"  (1.575 m)     Head Circumference --      Peak Flow --      Pain Score 11/20/18 1210 3     Pain Loc --      Pain Edu? --      Excl. in [place]? --     Constitutional: Alert and oriented. Well appearing and in no distress. Head: Normocephalic and atraumatic. Eyes: Conjunctivae are normal. PERRL. Normal extraocular movements Ears: Canals clear. TMs intact bilaterally.  TMs are slightly retracted, dull, and show very minimal serous effusion bilaterally. Nose: No congestion/rhinorrhea/epistaxis. Mouth/Throat: Mucous membranes are moist.  Uvula is midline and tonsils are flat.  No oropharyngeal lesions are appreciated. Neck: Supple. No thyromegaly. Hematological/Lymphatic/Immunological: No cervical lymphadenopathy. Cardiovascular: Normal rate, regular rhythm. Normal distal pulses. Respiratory: Normal respiratory effort. No wheezes/rales/rhonchi. Gastrointestinal: Soft and nontender. No distention. Musculoskeletal: Nontender with normal range of motion in all extremities.  Neurologic:  Normal gait without ataxia. Normal speech and language. No gross focal neurologic deficits are appreciated. Skin:  Skin is warm, dry and intact. No rash  noted. ____________________________________________  PROCEDURES  Decadron 10 mg IM Procedures ____________________________________________  INITIAL IMPRESSION / ASSESSMENT AND PLAN / ED COURSE  Patient with ED evaluation of sinus pressure and ear fullness on presentation.  No indication of any acute OM, bacterial sinus infection, dental infection or pharyngitis.  Patient clinical picture likely representative of a bilateral otitis effusion secondary to sinus congestion.  Patient be treated with a IM dose of Decadron in the ED.  She is also discharged with a prescription for prednisone to take in addition to her previously prescribed medications.  She will follow with her primary provider return to the ED as needed.  Chesni Vos was evaluated in Emergency Department on 11/20/2018 for the symptoms described in the history of present illness. She was evaluated in the context of the global COVID-19 pandemic, which necessitated consideration that the patient might be at risk for infection with the SARS-CoV-2 virus that causes COVID-19. Institutional protocols and algorithms that pertain to the evaluation of patients at risk for COVID-19 are in a state of rapid change based on information released by regulatory bodies including the CDC and federal and state organizations. These policies and  algorithms were followed during the patient's care in the ED. ____________________________________________  FINAL CLINICAL IMPRESSION(S) / ED DIAGNOSES  Final diagnoses:  Bilateral chronic serous otitis media  Acute non-recurrent maxillary sinusitis      Karmen StabsMenshew, Charlesetta IvoryJenise V Bacon, PA-C 11/20/18 1428    Chesley NoonJessup, Charles, MD 11/20/18 1538

## 2019-09-03 ENCOUNTER — Encounter: Payer: Self-pay | Admitting: Emergency Medicine

## 2019-09-03 ENCOUNTER — Emergency Department: Payer: Medicare Other

## 2019-09-03 DIAGNOSIS — Z5321 Procedure and treatment not carried out due to patient leaving prior to being seen by health care provider: Secondary | ICD-10-CM | POA: Insufficient documentation

## 2019-09-03 DIAGNOSIS — R519 Headache, unspecified: Secondary | ICD-10-CM | POA: Diagnosis present
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
## 2019-09-04 ENCOUNTER — Emergency Department
Admission: EM | Admit: 2019-09-04 | Discharge: 2019-09-04 | Disposition: A | Discharge status: Home / Self Care | Payer: Medicare Other | Attending: Emergency Medicine | Admitting: Emergency Medicine

## 2019-09-04 NOTE — ED Notes (Signed)
No answer when called several times from lobby 

## 2019-10-28 ENCOUNTER — Emergency Department
Admission: EM | Admit: 2019-10-28 | Discharge: 2019-10-28 | Disposition: A | Discharge status: Home / Self Care | Payer: Medicare Other | Attending: Emergency Medicine | Admitting: Emergency Medicine

## 2019-10-28 ENCOUNTER — Other Ambulatory Visit: Payer: Self-pay

## 2019-10-28 ENCOUNTER — Encounter: Payer: Self-pay | Admitting: Emergency Medicine

## 2019-10-28 DIAGNOSIS — F17210 Nicotine dependence, cigarettes, uncomplicated: Secondary | ICD-10-CM | POA: Diagnosis not present

## 2019-10-28 DIAGNOSIS — L02214 Cutaneous abscess of groin: Secondary | ICD-10-CM | POA: Diagnosis not present

## 2019-10-28 DIAGNOSIS — Z79890 Hormone replacement therapy (postmenopausal): Secondary | ICD-10-CM | POA: Diagnosis not present

## 2019-10-28 DIAGNOSIS — I10 Essential (primary) hypertension: Secondary | ICD-10-CM | POA: Insufficient documentation

## 2019-10-28 DIAGNOSIS — Z79899 Other long term (current) drug therapy: Secondary | ICD-10-CM | POA: Diagnosis not present

## 2019-10-28 DIAGNOSIS — Z7982 Long term (current) use of aspirin: Secondary | ICD-10-CM | POA: Diagnosis not present

## 2019-10-28 DIAGNOSIS — E039 Hypothyroidism, unspecified: Secondary | ICD-10-CM | POA: Diagnosis not present

## 2019-10-28 DIAGNOSIS — L0291 Cutaneous abscess, unspecified: Secondary | ICD-10-CM

## 2019-10-28 HISTORY — DX: Unspecified osteoarthritis, unspecified site: M19.90

## 2019-10-28 HISTORY — DX: Pure hypercholesterolemia, unspecified: E78.00

## 2019-10-28 HISTORY — DX: Fibromyalgia: M79.7

## 2019-10-28 MED ORDER — LIDOCAINE HCL (PF) 1 % IJ SOLN
10.0000 mL | Freq: Once | INTRAMUSCULAR | Status: AC
  Administered 2019-10-28: 10 mL
  Filled 2019-10-28: qty 10

## 2019-10-28 MED ORDER — SULFAMETHOXAZOLE-TRIMETHOPRIM 800-160 MG PO TABS: 1.0000 | ORAL_TABLET | Freq: Two times a day (BID) | ORAL | 0 refills | Status: DC

## 2019-10-28 MED ORDER — SULFAMETHOXAZOLE-TRIMETHOPRIM 800-160 MG PO TABS
1.0000 | ORAL_TABLET | Freq: Once | ORAL | Status: AC
  Administered 2019-10-28: 1 via ORAL
  Filled 2019-10-28: qty 1

## 2019-10-28 NOTE — ED Provider Notes (Signed)
[facility] Emergency Department Provider Note  ____________________________________________  Time seen: Approximately 5:32 PM  I have reviewed the triage vital signs and the nursing notes.   HISTORY  Chief Complaint Abscess    HPI Catherine Lester is a 54 y.o. female who presents the emergency department complaining of an abscess to the "bikini line" to the left side.  Patient states that she has a history of recurrent abscesses.  She states that she has had multiple times where she has had deep abscesses requiring surgical drainage.  She states that she has been told that she has a "higher than normal amount of staph on my skin."   Patient has been referred to dermatology for further investigation but states that the appointment is still pending as the referral took several months to feel.  Patient has a new abscess to the left groin.  This is not draining.  No fevers or chills.  She denies any streaking from the site.  No abdominal pain.  No nausea or vomiting, diarrhea.        Past Medical History:  Diagnosis Date  . Anxiety   . Arthritis   . Fibromyalgia   . High cholesterol   . Hx of heart valve insufficiency   . Hypertension   . Thyroid disease   . Urinary incontinence     Patient Active Problem List   Diagnosis Date Noted  . Schizoaffective disorder, bipolar type (HCC) 02/07/2016  . Alcohol use disorder, severe, dependence (HCC) 01/20/2016  . Alcohol withdrawal (HCC) 01/20/2016  . Cannabis use disorder, mild, abuse 01/20/2016  . Tobacco use disorder 01/20/2016  . HTN (hypertension) 01/20/2016  . Hypothyroidism 01/20/2016  . Opioid use disorder, moderate, dependence (HCC) 01/20/2016    Past Surgical History:  Procedure Laterality Date  . ANTERIOR FUSION CERVICAL SPINE     C5, C6  . BACK SURGERY    . CHOLECYSTECTOMY    . TONSILLECTOMY    . TUBAL LIGATION      Prior to Admission medications   Medication Sig Start Date End Date  Taking? Authorizing Provider  aspirin 81 MG chewable tablet Chew by mouth daily.    [provider]  benzonatate (TESSALON PERLES) 100 MG capsule Take 2 capsules (200 mg total) by mouth 3 (three) times daily as needed. 11/05/18 11/05/19  [name], PA-C  chlorpheniramine-HYDROcodone (TUSSIONEX PENNKINETIC ER) 10-8 MG/5ML SUER Take 5 mLs by mouth 2 (two) times daily. 11/05/18   [name], PA-C  citalopram (CELEXA) 20 MG tablet Take 20 mg by mouth daily.    [provider]  divalproex (DEPAKOTE ER) 500 MG 24 hr tablet Take 3 tablets (1,500 mg total) by mouth at bedtime. 02/07/16   Jimmy Footman, MD  fexofenadine-pseudoephedrine (ALLEGRA-D) 60-120 MG 12 hr tablet Take 1 tablet by mouth 2 (two) times daily. 11/05/18   [name], PA-C  levothyroxine (SYNTHROID, LEVOTHROID) 125 MCG tablet Take 125 mcg by mouth daily before breakfast.    [provider]  lisinopril (PRINIVIL,ZESTRIL) 20 MG tablet Take 20 mg by mouth daily.    [provider]  naproxen (NAPROSYN) 500 MG tablet Take 1 tablet (500 mg total) by mouth 2 (two) times daily with a meal. 11/05/18   [name], PA-C  OLANZapine (ZYPREXA) 15 MG tablet Take 2 tablets (30 mg total) by mouth at bedtime. 02/07/16   Jimmy Footman, MD  simvastatin (ZOCOR) 20 MG tablet Take 1 tablet (20 mg total) by mouth daily at  6 PM. 02/07/16   Jimmy Footman, MD  sulfamethoxazole-trimethoprim (BACTRIM DS) 800-160 MG tablet Take 1 tablet by mouth 2 (two) times daily. 10/28/19   Kraig Genis, Delorise Royals, PA-C  vitamin B-12 1000 MCG tablet Take 1 tablet (1,000 mcg total) by mouth daily. 02/07/16   Jimmy Footman, MD    Allergies Ancef [cefazolin], Ceclor [cefaclor], Demerol [meperidine], and Keflex [cephalexin]  No family history on file.  Social History Social History   Tobacco Use  . Smoking status: Current Every Day Smoker    Packs/day: 1.50    Types: Cigarettes   . Smokeless tobacco: Never Used  Substance Use Topics  . Alcohol use: Yes    Comment: 3-4 times weekly   . Drug use: Yes    Types: Marijuana     Review of Systems  Constitutional: No fever/chills Eyes: No visual changes. No discharge ENT: No upper respiratory complaints. Cardiovascular: no chest pain. Respiratory: no cough. No SOB. Gastrointestinal: No abdominal pain.  No nausea, no vomiting.  No diarrhea.  No constipation. Musculoskeletal: Negative for musculoskeletal pain. Skin: Abscess to left groin Neurological: Negative for headaches, focal weakness or numbness. 10-point ROS otherwise negative.  ____________________________________________   PHYSICAL EXAM:  VITAL SIGNS: ED Triage Vitals  Enc Vitals Group     BP 10/28/19 1553 120/72     Pulse Rate 10/28/19 1553 89     Resp 10/28/19 1553 16     Temp 10/28/19 1553 99.2 F (37.3 C)     Temp Source 10/28/19 1553 Oral     SpO2 10/28/19 1553 99 %     Weight 10/28/19 1554 150 lb (68 kg)     Height 10/28/19 1554 5\' 2"  (1.575 m)     Head Circumference --      Peak Flow --      Pain Score 10/28/19 1554 5     Pain Loc --      Pain Edu? --      Excl. in [place]? --      Constitutional: Alert and oriented. Well appearing and in no acute distress. Eyes: Conjunctivae are normal. PERRL. EOMI. Head: Atraumatic. ENT:      Ears:       Nose: No congestion/rhinnorhea.      Mouth/Throat: Mucous membranes are moist.  Neck: No stridor.    Cardiovascular: Normal rate, regular rhythm. Normal S1 and S2.  Good peripheral circulation. Respiratory: Normal respiratory effort without tachypnea or retractions. Lungs CTAB. Good air entry to the bases with no decreased or absent breath sounds. Gastrointestinal: Bowel sounds 4 quadrants. Soft and nontender to palpation. No guarding or rigidity. No palpable masses. No distention. No CVA tenderness. Musculoskeletal: Full range of motion to all extremities. No gross deformities  appreciated. Neurologic:  Normal speech and language. No gross focal neurologic deficits are appreciated.  Skin:  Skin is warm, dry and intact. No rash noted.  Visualization of the left inguinal fold reveals erythematous, edematous, fluctuant lesion consistent with abscess.  This measures approximately 3 cm in length and 1.5 cm in width.  There is some mild drainage with palpation.  Greatest area of erythema measures approximately 3-1/2 cm in diameter.  No streaking.  No regional lymphadenopathy. Psychiatric: Mood and affect are normal. Speech and behavior are normal. Patient exhibits appropriate insight and judgement.   ____________________________________________   LABS (all labs ordered are listed, but only abnormal results are displayed)  Labs Reviewed - No data to display ____________________________________________  EKG   ____________________________________________  RADIOLOGY  No results found.  ____________________________________________    PROCEDURES  Procedure(s) performed:    Marland KitchenMarland KitchenIncision and Drainage  Date/Time: 10/28/2019 6:44 PM Performed by: Racheal Patches, PA-C Authorized by: Racheal Patches, PA-C   Consent:    Consent obtained:  Verbal   Consent given by:  Patient   Risks discussed:  Incomplete drainage and pain Location:    Type:  Abscess   Location:  Trunk   Trunk location: L groin. Pre-procedure details:    Skin preparation:  Betadine Anesthesia (see MAR for exact dosages):    Anesthesia method:  Local infiltration   Local anesthetic:  Lidocaine 1% w/o epi Procedure type:    Complexity:  Simple Procedure details:    Incision types:  Single straight   Incision depth:  Subcutaneous   Scalpel blade:  11   Wound management:  Probed and deloculated   Drainage:  Bloody and purulent   Drainage amount:  Moderate   Wound treatment:  Wound left open   Packing materials:  None Post-procedure details:    Patient tolerance of  procedure:  Tolerated well, no immediate complications      Medications  lidocaine (PF) (XYLOCAINE) 1 % injection 10 mL (has no administration in time range)  sulfamethoxazole-trimethoprim (BACTRIM DS) 800-160 MG per tablet 1 tablet (has no administration in time range)     ____________________________________________   INITIAL IMPRESSION / ASSESSMENT AND PLAN / ED COURSE  Pertinent labs & imaging results that were available during my care of the patient were reviewed by me and considered in my medical decision making (see chart for details).  Review of the [place] CSRS was performed in accordance of the [facility] prior to dispensing any controlled drugs.           Patient's diagnosis is consistent with abscess.  Patient presented to emergency department with an abscess to the left groin.  Patient has recurrent abscesses and has been referred to dermatology.  Patient has not been able to establish the appointment yet due to the length of wait time.  Patient developed another abscess to the left groin.  This was incised and drained in the emergency department.  Patient will be started on antibiotics for same.  Follow-up with dermatology for further management.  Return precautions discussed with the patient..  Patient is given ED precautions to return to the ED for any worsening or new symptoms.     ____________________________________________  FINAL CLINICAL IMPRESSION(S) / ED DIAGNOSES  Final diagnoses:  Abscess      NEW MEDICATIONS STARTED DURING THIS VISIT:  ED Discharge Orders         Ordered    sulfamethoxazole-trimethoprim (BACTRIM DS) 800-160 MG tablet  2 times daily     Discontinue  Reprint     10/28/19 1848              This chart was dictated using voice recognition software/Dragon. Despite best efforts to proofread, errors can occur which can change the meaning. Any change was purely unintentional.    Racheal Patches, PA-C 10/28/19 Gracy Bruins, MD 10/28/19 2059

## 2019-10-28 NOTE — ED Triage Notes (Signed)
Patient presents to the ED with abscess to her bikini line area.  Patient states she was told to go to a dermatologist by her PCP but has had difficulty getting an appointment.  Patient states area has been draining slightly with washing.

## 2019-11-04 ENCOUNTER — Ambulatory Visit (INDEPENDENT_AMBULATORY_CARE_PROVIDER_SITE_OTHER): Payer: Medicare Other | Admitting: Dermatology

## 2019-11-04 ENCOUNTER — Other Ambulatory Visit: Payer: Self-pay

## 2019-11-04 ENCOUNTER — Encounter: Payer: Self-pay | Admitting: Dermatology

## 2019-11-04 DIAGNOSIS — L732 Hidradenitis suppurativa: Secondary | ICD-10-CM

## 2019-11-04 MED ORDER — CLINDAMYCIN PHOSPHATE 1 % EX LOTN: TOPICAL_LOTION | CUTANEOUS | 4 refills | Status: AC

## 2019-11-04 MED ORDER — DOXYCYCLINE MONOHYDRATE 100 MG PO TABS: 100.0000 mg | ORAL_TABLET | Freq: Two times a day (BID) | ORAL | 4 refills | Status: DC

## 2019-11-04 NOTE — Patient Instructions (Signed)
Recommend daily broad spectrum sunscreen SPF 30+ to sun-exposed areas, reapply every 2 hours as needed. Call for new or changing lesions.  

## 2019-11-04 NOTE — Progress Notes (Signed)
   New Patient Visit  Subjective  Catherine Lester is a 54 y.o. female who presents for the following: New Patient (Initial Visit).  Patient presents today as a new patient to establish care at this practice. Patient was recently seen in ED for Abcess at bikini line which was lanced, has been taking Bactrim 800-160 mg BID for 7 days. She is doing better now.  She did not get a culture at the time it was drained.  Here to follow up on that visit with Dermatology. She has had problems with recurring cysts for over 30 years in axilla, under breasts, and in bikini area.  They are painful, will drain, scar, and greatly negatively impact her life.  The following portions of the chart were reviewed this encounter and updated as appropriate:      Review of Systems:  No other skin or systemic complaints except as noted in HPI or Assessment and Plan.  Objective  Well appearing patient in no apparent distress; mood and affect are within normal limits.  A focused examination was performed including Waist. Relevant physical exam findings are noted in the Assessment and Plan.  Objective  Left Groin, B/L Axilla, B/L inframammary: Flat nodule with dusky erythema with induration and drainage L inguinal crease  Violaceous scars B/L Axilla  Scars B/L Inframammary   Assessment & Plan  Hidradenitis Left Groin, B/L Axilla, B/L inframammary  With Abcess Bacterial culture performed today to left inguinal groin Finish Bactrim (3 doses left), then Start Doxycycline Monohydrate 100 mg BID with food Start Clindamycin lotion QD after shower Start a OTC Benzol peroxide cleanser in shower qd, let sit several minutes prior to rinsing. Continue warm compresses to L groin prn  Gave patient info on HS.  Briefly discussed Humira if not improving on above regimen.  Anaerobic and Aerobic Culture - Left Groin, B/L Axilla, B/L inframammary  doxycycline (ADOXA) 100 MG tablet - Left Groin, B/L Axilla, B/L  inframammary  clindamycin (CLEOCIN-T) 1 % lotion - Left Groin, B/L Axilla, B/L inframammary  Return in about 2 months (around 01/04/2020) for hidradenitis.  Allen Norris, CMA, am acting as scribe for Willeen Niece, MD .  Documentation: I have reviewed the above documentation for accuracy and completeness, and I agree with the above.  Willeen Niece MD

## 2019-11-09 LAB — ANAEROBIC AND AEROBIC CULTURE

## 2019-11-09 LAB — SPECIMEN STATUS REPORT

## 2020-01-05 ENCOUNTER — Ambulatory Visit: Payer: Medicare Other | Admitting: Dermatology

## 2020-06-24 DIAGNOSIS — Z8541 Personal history of malignant neoplasm of cervix uteri: Secondary | ICD-10-CM | POA: Insufficient documentation
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
## 2020-07-07 ENCOUNTER — Ambulatory Visit: Payer: Medicare Other

## 2020-07-07 ENCOUNTER — Other Ambulatory Visit: Payer: Self-pay

## 2020-07-07 ENCOUNTER — Inpatient Hospital Stay: Payer: Medicare Other | Attending: Hematology and Oncology | Admitting: Hematology and Oncology

## 2020-07-07 ENCOUNTER — Encounter: Payer: Self-pay | Admitting: Hematology and Oncology

## 2020-07-07 VITALS — BP 118/62 | HR 80 | Temp 98.0°F | Resp 16 | Wt 155.0 lb

## 2020-07-07 DIAGNOSIS — I7300 Raynaud's syndrome without gangrene: Secondary | ICD-10-CM | POA: Diagnosis not present

## 2020-07-07 DIAGNOSIS — Z800 Family history of malignant neoplasm of digestive organs: Secondary | ICD-10-CM | POA: Diagnosis not present

## 2020-07-07 DIAGNOSIS — Z8701 Personal history of pneumonia (recurrent): Secondary | ICD-10-CM | POA: Insufficient documentation

## 2020-07-07 DIAGNOSIS — F17210 Nicotine dependence, cigarettes, uncomplicated: Secondary | ICD-10-CM | POA: Insufficient documentation

## 2020-07-07 DIAGNOSIS — J42 Unspecified chronic bronchitis: Secondary | ICD-10-CM | POA: Insufficient documentation

## 2020-07-07 DIAGNOSIS — R5381 Other malaise: Secondary | ICD-10-CM | POA: Insufficient documentation

## 2020-07-07 DIAGNOSIS — Z79899 Other long term (current) drug therapy: Secondary | ICD-10-CM | POA: Insufficient documentation

## 2020-07-07 DIAGNOSIS — Z791 Long term (current) use of non-steroidal anti-inflammatories (NSAID): Secondary | ICD-10-CM | POA: Insufficient documentation

## 2020-07-07 DIAGNOSIS — M797 Fibromyalgia: Secondary | ICD-10-CM | POA: Diagnosis not present

## 2020-07-07 DIAGNOSIS — D7589 Other specified diseases of blood and blood-forming organs: Secondary | ICD-10-CM | POA: Diagnosis not present

## 2020-07-07 DIAGNOSIS — D72829 Elevated white blood cell count, unspecified: Secondary | ICD-10-CM

## 2020-07-07 DIAGNOSIS — Z8541 Personal history of malignant neoplasm of cervix uteri: Secondary | ICD-10-CM | POA: Insufficient documentation

## 2020-07-07 DIAGNOSIS — M1990 Unspecified osteoarthritis, unspecified site: Secondary | ICD-10-CM | POA: Insufficient documentation

## 2020-07-07 DIAGNOSIS — R5383 Other fatigue: Secondary | ICD-10-CM | POA: Insufficient documentation

## 2020-07-07 DIAGNOSIS — D751 Secondary polycythemia: Secondary | ICD-10-CM | POA: Insufficient documentation

## 2020-07-07 DIAGNOSIS — R634 Abnormal weight loss: Secondary | ICD-10-CM | POA: Diagnosis not present

## 2020-07-07 DIAGNOSIS — Z7982 Long term (current) use of aspirin: Secondary | ICD-10-CM | POA: Insufficient documentation

## 2020-07-07 DIAGNOSIS — F1290 Cannabis use, unspecified, uncomplicated: Secondary | ICD-10-CM | POA: Insufficient documentation

## 2020-07-07 LAB — CBC WITH DIFFERENTIAL/PLATELET
Abs Immature Granulocytes: 0.06 10*3/uL (ref 0.00–0.07)
Basophils Absolute: 0.1 10*3/uL (ref 0.0–0.1)
Basophils Relative: 1 %
Eosinophils Absolute: 0.3 10*3/uL (ref 0.0–0.5)
Eosinophils Relative: 2 %
HCT: 41.7 % (ref 36.0–46.0)
Hemoglobin: 14.5 g/dL (ref 12.0–15.0)
Immature Granulocytes: 1 %
Lymphocytes Relative: 19 %
Lymphs Abs: 2.3 10*3/uL (ref 0.7–4.0)
MCH: 35.2 pg — ABNORMAL HIGH (ref 26.0–34.0)
MCHC: 34.8 g/dL (ref 30.0–36.0)
MCV: 101.2 fL — ABNORMAL HIGH (ref 80.0–100.0)
Monocytes Absolute: 0.9 10*3/uL (ref 0.1–1.0)
Monocytes Relative: 7 %
Neutro Abs: 8.7 10*3/uL — ABNORMAL HIGH (ref 1.7–7.7)
Neutrophils Relative %: 70 %
Platelets: 287 10*3/uL (ref 150–400)
RBC: 4.12 MIL/uL (ref 3.87–5.11)
RDW: 11.9 % (ref 11.5–15.5)
WBC: 12.3 10*3/uL — ABNORMAL HIGH (ref 4.0–10.5)
nRBC: 0 % (ref 0.0–0.2)

## 2020-07-07 LAB — IRON AND TIBC
Iron: 59 ug/dL (ref 28–170)
Saturation Ratios: 14 % (ref 10.4–31.8)
TIBC: 412 ug/dL (ref 250–450)
UIBC: 353 ug/dL

## 2020-07-07 LAB — FERRITIN: Ferritin: 38 ng/mL (ref 11–307)

## 2020-07-07 LAB — SEDIMENTATION RATE: Sed Rate: 13 mm/hr (ref 0–30)

## 2020-07-07 LAB — C-REACTIVE PROTEIN: CRP: 0.8 mg/dL (ref <1.0)

## 2020-07-08 LAB — CARBON MONOXIDE, BLOOD (PERFORMED AT REF LAB): Carbon Monoxide, Blood: 8.9 % — ABNORMAL HIGH (ref 0.0–3.6)

## 2020-07-08 LAB — ERYTHROPOIETIN: Erythropoietin: 8.1 m[IU]/mL (ref 2.6–18.5)

## 2020-07-08 LAB — PATHOLOGIST SMEAR REVIEW

## 2020-07-12 ENCOUNTER — Encounter: Payer: Self-pay | Admitting: *Deleted

## 2020-07-12 ENCOUNTER — Telehealth: Payer: Self-pay | Admitting: *Deleted

## 2020-07-12 DIAGNOSIS — Z122 Encounter for screening for malignant neoplasm of respiratory organs: Secondary | ICD-10-CM

## 2020-07-12 DIAGNOSIS — Z87891 Personal history of nicotine dependence: Secondary | ICD-10-CM

## 2020-07-12 DIAGNOSIS — F17200 Nicotine dependence, unspecified, uncomplicated: Secondary | ICD-10-CM

## 2020-07-12 LAB — BCR-ABL1 FISH
Cells Analyzed: 200
Cells Counted: 200

## 2020-07-12 NOTE — Telephone Encounter (Signed)
Received referral for initial lung cancer screening scan. Contacted patient and obtained smoking history,(current smoker, 1 ppd x 25 years) as well as answering questions related to screening process. Patient denies signs of lung cancer such as weight loss or hemoptysis. Patient denies comorbidity that would prevent curative treatment if lung cancer were found. Patient is scheduled for shared decision making visit and CT scan on 07/20/20 @ 1:00 pm.

## 2020-07-15 ENCOUNTER — Inpatient Hospital Stay (HOSPITAL_BASED_OUTPATIENT_CLINIC_OR_DEPARTMENT_OTHER): Payer: Medicare Other | Admitting: Oncology

## 2020-07-15 ENCOUNTER — Inpatient Hospital Stay: Payer: Medicare Other

## 2020-07-15 ENCOUNTER — Other Ambulatory Visit: Payer: Self-pay

## 2020-07-15 VITALS — BP 147/70 | HR 70 | Temp 98.3°F | Resp 18 | Wt 153.6 lb

## 2020-07-15 DIAGNOSIS — Z716 Tobacco abuse counseling: Secondary | ICD-10-CM | POA: Diagnosis not present

## 2020-07-15 DIAGNOSIS — Z7189 Other specified counseling: Secondary | ICD-10-CM

## 2020-07-15 DIAGNOSIS — D751 Secondary polycythemia: Secondary | ICD-10-CM | POA: Diagnosis not present

## 2020-07-15 DIAGNOSIS — D72829 Elevated white blood cell count, unspecified: Secondary | ICD-10-CM | POA: Diagnosis not present

## 2020-07-15 DIAGNOSIS — F17200 Nicotine dependence, unspecified, uncomplicated: Secondary | ICD-10-CM

## 2020-07-15 DIAGNOSIS — D7589 Other specified diseases of blood and blood-forming organs: Secondary | ICD-10-CM

## 2020-07-15 DIAGNOSIS — E038 Other specified hypothyroidism: Secondary | ICD-10-CM

## 2020-07-15 DIAGNOSIS — D520 Dietary folate deficiency anemia: Secondary | ICD-10-CM

## 2020-07-15 LAB — CBC WITH DIFFERENTIAL/PLATELET
Abs Immature Granulocytes: 0.05 10*3/uL (ref 0.00–0.07)
Basophils Absolute: 0.1 10*3/uL (ref 0.0–0.1)
Basophils Relative: 1 %
Eosinophils Absolute: 0.2 10*3/uL (ref 0.0–0.5)
Eosinophils Relative: 2 %
HCT: 41.4 % (ref 36.0–46.0)
Hemoglobin: 14.1 g/dL (ref 12.0–15.0)
Immature Granulocytes: 0 %
Lymphocytes Relative: 25 %
Lymphs Abs: 3 10*3/uL (ref 0.7–4.0)
MCH: 34.4 pg — ABNORMAL HIGH (ref 26.0–34.0)
MCHC: 34.1 g/dL (ref 30.0–36.0)
MCV: 101 fL — ABNORMAL HIGH (ref 80.0–100.0)
Monocytes Absolute: 0.7 10*3/uL (ref 0.1–1.0)
Monocytes Relative: 6 %
Neutro Abs: 7.9 10*3/uL — ABNORMAL HIGH (ref 1.7–7.7)
Neutrophils Relative %: 66 %
Platelets: 338 10*3/uL (ref 150–400)
RBC: 4.1 MIL/uL (ref 3.87–5.11)
RDW: 11.9 % (ref 11.5–15.5)
WBC: 11.9 10*3/uL — ABNORMAL HIGH (ref 4.0–10.5)
nRBC: 0 % (ref 0.0–0.2)

## 2020-07-15 LAB — RETIC PANEL
Immature Retic Fract: 23.4 % — ABNORMAL HIGH (ref 2.3–15.9)
RBC.: 3.98 MIL/uL (ref 3.87–5.11)
Retic Count, Absolute: 83.6 10*3/uL (ref 19.0–186.0)
Retic Ct Pct: 2.1 % (ref 0.4–3.1)
Reticulocyte Hemoglobin: 39.9 pg (ref >27.9)

## 2020-07-15 LAB — TSH: TSH: 2.182 u[IU]/mL (ref 0.350–4.500)

## 2020-07-15 LAB — VITAMIN B12: Vitamin B-12: 243 pg/mL (ref 180–914)

## 2020-07-15 LAB — MISC LABCORP TEST (SEND OUT): Labcorp test code: 123200

## 2020-07-15 LAB — FOLATE: Folate: 13.4 ng/mL (ref >5.9)

## 2020-07-15 LAB — T4, FREE: Free T4: 1.27 ng/dL — ABNORMAL HIGH (ref 0.61–1.12)
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
## 2020-07-19 ENCOUNTER — Telehealth: Payer: Self-pay | Admitting: Oncology

## 2020-07-19 NOTE — Telephone Encounter (Signed)
07/19/2020 Spoke w/ pt and informed her of 3 month f/u appt per Boneta Lucks on 8/2 @ 2:45 for Lab/Np assess. Pt confirmed appt SRW

## 2020-07-20 ENCOUNTER — Ambulatory Visit: Payer: Medicare Other

## 2020-07-20 ENCOUNTER — Inpatient Hospital Stay: Payer: Medicare Other | Admitting: Oncology

## 2020-07-21 LAB — CALR + JAK2 E12-15 + MPL (REFLEXED)

## 2020-07-21 LAB — JAK2 V617F, W REFLEX TO CALR/E12/MPL

## 2020-08-03 ENCOUNTER — Inpatient Hospital Stay: Payer: Medicare Other | Admitting: Hospice and Palliative Medicine

## 2020-08-03 ENCOUNTER — Other Ambulatory Visit: Payer: Self-pay

## 2020-08-03 ENCOUNTER — Ambulatory Visit
Admission: RE | Admit: 2020-08-03 | Discharge: 2020-08-03 | Disposition: A | Discharge status: Home / Self Care | Payer: Medicare Other | Source: Ambulatory Visit | Attending: Nurse Practitioner | Admitting: Nurse Practitioner

## 2020-08-03 DIAGNOSIS — F17200 Nicotine dependence, unspecified, uncomplicated: Secondary | ICD-10-CM | POA: Insufficient documentation

## 2020-08-03 DIAGNOSIS — Z87891 Personal history of nicotine dependence: Secondary | ICD-10-CM | POA: Insufficient documentation

## 2020-08-03 DIAGNOSIS — Z122 Encounter for screening for malignant neoplasm of respiratory organs: Secondary | ICD-10-CM | POA: Diagnosis present

## 2020-08-05 ENCOUNTER — Telehealth: Payer: Medicare Other | Admitting: Nurse Practitioner

## 2020-08-05 ENCOUNTER — Telehealth: Payer: Self-pay | Admitting: *Deleted

## 2020-08-05 ENCOUNTER — Other Ambulatory Visit: Payer: Medicare Other

## 2020-08-05 NOTE — Telephone Encounter (Signed)
Notified patient of LDCT lung cancer screening program results with recommendation for 12 month follow up imaging. Also notified of incidental findings noted below and is encouraged to discuss further with PCP who will receive a copy of this note and/or the CT report. Patient verbalizes understanding.   IMPRESSION: 1. Lung-RADS 2S, benign appearance or behavior. Continue annual screening with low-dose chest CT without contrast in 12 months. 2. The "S" modifier above refers to potentially clinically significant non lung cancer related findings. Specifically, there is aortic atherosclerosis, in addition to left main and 3 vessel coronary artery disease. Please note that although the presence of coronary artery calcium documents the presence of coronary artery disease, the severity of this disease and any potential stenosis cannot be assessed on this non-gated CT examination. Assessment for potential risk factor modification, dietary therapy or pharmacologic therapy may be warranted, if clinically indicated. 3. Mild diffuse bronchial wall thickening with very mild centrilobular and paraseptal emphysema; imaging findings suggestive of underlying COPD.  Aortic Atherosclerosis (ICD10-I70.0) and Emphysema (ICD10-J43.9). 

## 2020-10-18 ENCOUNTER — Other Ambulatory Visit: Payer: Self-pay

## 2020-10-18 DIAGNOSIS — D520 Dietary folate deficiency anemia: Secondary | ICD-10-CM

## 2020-10-18 DIAGNOSIS — D72829 Elevated white blood cell count, unspecified: Secondary | ICD-10-CM

## 2020-10-19 ENCOUNTER — Inpatient Hospital Stay: Payer: Medicare Other | Attending: Pediatrics

## 2020-10-19 ENCOUNTER — Inpatient Hospital Stay: Payer: Medicare Other | Admitting: Internal Medicine

## 2021-04-28 ENCOUNTER — Ambulatory Visit (INDEPENDENT_AMBULATORY_CARE_PROVIDER_SITE_OTHER): Payer: Medicare Other | Admitting: Psychiatry

## 2021-04-28 ENCOUNTER — Other Ambulatory Visit: Payer: Self-pay

## 2021-04-28 ENCOUNTER — Encounter: Payer: Self-pay | Admitting: Psychiatry

## 2021-04-28 VITALS — BP 132/78 | HR 97 | Temp 97.3°F | Wt 149.0 lb

## 2021-04-28 DIAGNOSIS — F1994 Other psychoactive substance use, unspecified with psychoactive substance-induced mood disorder: Secondary | ICD-10-CM

## 2021-04-28 DIAGNOSIS — F1220 Cannabis dependence, uncomplicated: Secondary | ICD-10-CM

## 2021-04-28 DIAGNOSIS — I7300 Raynaud's syndrome without gangrene: Secondary | ICD-10-CM | POA: Insufficient documentation

## 2021-04-28 DIAGNOSIS — Z8659 Personal history of other mental and behavioral disorders: Secondary | ICD-10-CM

## 2021-04-28 DIAGNOSIS — F1020 Alcohol dependence, uncomplicated: Secondary | ICD-10-CM | POA: Diagnosis not present

## 2021-04-28 DIAGNOSIS — F17200 Nicotine dependence, unspecified, uncomplicated: Secondary | ICD-10-CM

## 2021-04-28 DIAGNOSIS — E7849 Other hyperlipidemia: Secondary | ICD-10-CM | POA: Insufficient documentation

## 2021-04-28 DIAGNOSIS — F419 Anxiety disorder, unspecified: Secondary | ICD-10-CM | POA: Insufficient documentation

## 2021-04-28 NOTE — Patient Instructions (Signed)
[email or web address] [address], [place], [place] [address]  [phone]  [email or web address] [address], [place], [place] [address]  ~2.9 mi [phone]
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
## 2021-04-29 ENCOUNTER — Encounter: Payer: Self-pay | Admitting: Psychiatry

## 2021-05-02 ENCOUNTER — Other Ambulatory Visit: Payer: Self-pay

## 2021-05-02 ENCOUNTER — Ambulatory Visit (INDEPENDENT_AMBULATORY_CARE_PROVIDER_SITE_OTHER): Payer: Medicare Other | Admitting: Licensed Clinical Social Worker

## 2021-05-02 DIAGNOSIS — F4323 Adjustment disorder with mixed anxiety and depressed mood: Secondary | ICD-10-CM | POA: Diagnosis not present

## 2021-05-02 DIAGNOSIS — Z634 Disappearance and death of family member: Secondary | ICD-10-CM

## 2021-05-04 NOTE — Plan of Care (Signed)
Developed tx plan with input from pt
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
## 2021-06-14 ENCOUNTER — Other Ambulatory Visit: Payer: Self-pay

## 2021-06-14 ENCOUNTER — Ambulatory Visit (INDEPENDENT_AMBULATORY_CARE_PROVIDER_SITE_OTHER): Payer: Medicare Other | Admitting: Licensed Clinical Social Worker

## 2021-06-14 DIAGNOSIS — Z634 Disappearance and death of family member: Secondary | ICD-10-CM | POA: Diagnosis not present

## 2021-06-14 DIAGNOSIS — F4323 Adjustment disorder with mixed anxiety and depressed mood: Secondary | ICD-10-CM | POA: Diagnosis not present

## 2021-06-14 NOTE — Plan of Care (Signed)
?  Problem: Depression CCP Problem  1 Decrease depressive symptoms and improve levels of effective functioning-pt reports a decrease in overall depression symptoms 3 out of 5 sessions documented.  ?Goal: LTG: Reduce frequency, intensity, and duration of depression symptoms as evidenced by: SSB input needed on appropriate metric ?Outcome: Progressing ?Goal: STG: Catherine Lester WILL PARTICIPATE IN AT LEAST 80% OF SCHEDULED INDIVIDUAL PSYCHOTHERAPY SESSIONS ?Outcome: Progressing ?  ?Problem: Anxiety Disorder CCP Problem  1 Reduce overall frequency, intensity, and duration of the anxiety so that daily functioning is not impaired per pt self report 3 out of 5 sessions documented.   ?Goal: LTG: Patient will score less than 5 on the Generalized Anxiety Disorder 7 Scale (GAD-7) ?Outcome: Not Progressing ?Note: Pt anxiety levels high due to recent external stressors ?Intervention: Encourage verbalization of feelings/concerns/expectations ?Intervention: Continue cognitive behavioral therapy for setting healthy boundaries ?Intervention: Encourage patient to set small goals for self ?  ?
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
## 2021-08-01 ENCOUNTER — Telehealth: Payer: Self-pay | Admitting: Licensed Clinical Social Worker

## 2021-08-01 ENCOUNTER — Ambulatory Visit (INDEPENDENT_AMBULATORY_CARE_PROVIDER_SITE_OTHER): Payer: Self-pay | Admitting: Licensed Clinical Social Worker

## 2021-08-01 DIAGNOSIS — Z91199 Patient's noncompliance with other medical treatment and regimen due to unspecified reason: Secondary | ICD-10-CM

## 2021-08-01 NOTE — Progress Notes (Signed)
LCSW counselor tried to connect with patient for scheduled appointment via MyChart video text request x 2 and email request; also tried to connect via phone without success. LCSW counselor left message for patient to call office number to reschedule OPT appointment.  ? ?Attempt 1: Text and email: 10:03a ? ?Attempt 2: Text and email: 10:09a ? ?Attempt 3: phone call: 10:15a.  Left message. ? ?Visit will be coded as No Show ? ?

## 2021-08-01 NOTE — Telephone Encounter (Signed)
LCSW counselor tried to connect with patient for scheduled appointment via MyChart video text request x 2 and email request; also tried to connect via phone without success. LCSW counselor left message for patient to call office number to reschedule OPT appointment.  ? ?Attempt 1: Text and email: 10:03a ? ?Attempt 2: Text and email: 10:09a ? ?Attempt 3: phone call: 10:15a.  Left message. ? ?Visit will be coded as No Show ? ?

## 2021-09-05 ENCOUNTER — Telehealth: Payer: Self-pay | Admitting: *Deleted

## 2021-09-05 NOTE — Telephone Encounter (Signed)
LMTC to schedule Yearly Lung CA CT Scan. 

## 2022-01-15 ENCOUNTER — Other Ambulatory Visit: Payer: Self-pay

## 2022-01-15 ENCOUNTER — Emergency Department
Admission: EM | Admit: 2022-01-15 | Discharge: 2022-01-15 | Disposition: A | Discharge status: Home / Self Care | Payer: Medicare (Managed Care) | Attending: Emergency Medicine | Admitting: Emergency Medicine

## 2022-01-15 ENCOUNTER — Encounter: Payer: Self-pay | Admitting: Intensive Care

## 2022-01-15 DIAGNOSIS — L0292 Furuncle, unspecified: Secondary | ICD-10-CM

## 2022-01-15 DIAGNOSIS — I10 Essential (primary) hypertension: Secondary | ICD-10-CM | POA: Diagnosis not present

## 2022-01-15 DIAGNOSIS — F17200 Nicotine dependence, unspecified, uncomplicated: Secondary | ICD-10-CM | POA: Diagnosis not present

## 2022-01-15 DIAGNOSIS — L089 Local infection of the skin and subcutaneous tissue, unspecified: Secondary | ICD-10-CM | POA: Diagnosis present

## 2022-01-15 DIAGNOSIS — L03317 Cellulitis of buttock: Secondary | ICD-10-CM | POA: Insufficient documentation

## 2022-01-15 DIAGNOSIS — L0390 Cellulitis, unspecified: Secondary | ICD-10-CM

## 2022-01-15 DIAGNOSIS — Z8541 Personal history of malignant neoplasm of cervix uteri: Secondary | ICD-10-CM | POA: Insufficient documentation

## 2022-01-15 DIAGNOSIS — E039 Hypothyroidism, unspecified: Secondary | ICD-10-CM | POA: Insufficient documentation

## 2022-01-15 MED ORDER — CLINDAMYCIN HCL 300 MG PO CAPS: 300.0000 mg | ORAL_CAPSULE | Freq: Three times a day (TID) | ORAL | 0 refills | Status: AC

## 2022-01-15 MED ORDER — CLINDAMYCIN HCL 150 MG PO CAPS
300.0000 mg | ORAL_CAPSULE | Freq: Once | ORAL | Status: AC
Start: 2022-01-15 — End: 2022-01-15
  Administered 2022-01-15: 300 mg via ORAL
  Filled 2022-01-15: qty 2

## 2022-01-15 NOTE — ED Triage Notes (Signed)
Patient reports she has two boils at this time and describes as walnut size.

## 2022-01-15 NOTE — Discharge Instructions (Signed)
Take the antibiotics as prescribed.  Please return for any new, worsening, or change in symptoms or other concerns.  Keep the area clean and dry, wash multiple times with soap and water.  It was a pleasure caring for you today.

## 2022-01-15 NOTE — ED Provider Notes (Signed)
[facility] Provider Note    Event Date/Time   First MD Initiated Contact with Patient 01/15/22 1749     (approximate)   History   Recurrent Skin Infections   HPI  Catherine Lester is a 56 y.o. female with a past medical history of hyperlipidemia, anxiety, substance-induced mood disorder, schizoaffective disorder, alcohol use disorder, opiate use disorder, who presents today for evaluation of "boils."  She reports that she has had these multiple times in the past.  She reports that she has 1 on her tailbone and one on her bikini line.  She reports that they are both open and draining.  No fevers or chills.  Patient Active Problem List   Diagnosis Date Noted   Raynaud's disease 04/28/2021   Familial hyperlipidemia 04/28/2021   Anxiety 04/28/2021   Substance induced mood disorder ([place]) 04/28/2021   History of schizoaffective disorder 04/28/2021   Cannabis use disorder, moderate, dependence ([place]) 04/28/2021   Leukocytosis 07/07/2020   Erythrocytosis 07/07/2020   Hx of cervical cancer 06/24/2020   Mixed stress and urge urinary incontinence 04/04/2018   Lumbosacral spondylosis without myelopathy 12/06/2016   Chronic bilateral low back pain 12/06/2016   Failed back syndrome, cervical 12/06/2016   RUQ abdominal pain 08/31/2016   Other chest pain 04/07/2016   Moderate aortic valve insufficiency 04/07/2016   Familial hypercholesterolemia 04/07/2016   Schizoaffective disorder, bipolar type ([place]) 02/07/2016   Alcohol use disorder, severe, dependence ([place]) 01/20/2016   Alcohol withdrawal ([place]) 01/20/2016   Cannabis use disorder, mild, abuse 01/20/2016   Tobacco use disorder 01/20/2016   HTN (hypertension) 01/20/2016   Hypothyroidism 01/20/2016   Opioid use disorder, moderate, dependence ([place]) 01/20/2016   Positive ANA (antinuclear antibody) 07/16/2014   Cough 07/16/2014   Chronic fatigue 07/16/2014   Arthralgia 07/16/2014          Physical Exam    Triage Vital Signs: ED Triage Vitals  Enc Vitals Group     BP 01/15/22 1623 113/86     Pulse Rate 01/15/22 1623 78     Resp 01/15/22 1623 16     Temp 01/15/22 1623 98.4 F (36.9 C)     Temp Source 01/15/22 1623 Oral     SpO2 01/15/22 1623 100 %     Weight 01/15/22 1621 150 lb (68 kg)     Height 01/15/22 1621 5\' 2"  (1.575 m)     Head Circumference --      Peak Flow --      Pain Score 01/15/22 1620 10     Pain Loc --      Pain Edu? --      Excl. in [place]? --     Most recent vital signs: Vitals:   01/15/22 1623  BP: 113/86  Pulse: 78  Resp: 16  Temp: 98.4 F (36.9 C)  SpO2: 100%    Physical Exam Vitals and nursing note reviewed.  Constitutional:      General: Awake and alert. No acute distress.    Appearance: Normal appearance. The patient is normal weight.  HENT:     Head: Normocephalic and atraumatic.     Mouth: Mucous membranes are moist.  Eyes:     General: PERRL. Normal EOMs        Right eye: No discharge.        Left eye: No discharge.     Conjunctiva/sclera: Conjunctivae normal.  Cardiovascular:     Rate and Rhythm: Normal rate and regular rhythm.  Pulses: Normal pulses.     Heart sounds: Normal heart sounds Pulmonary:     Effort: Pulmonary effort is normal. No respiratory distress.     Breath sounds: Normal breath sounds.  Abdominal:     Abdomen is soft. There is no abdominal tenderness. No rebound or guarding. No distention. Musculoskeletal:        General: No swelling. Normal range of motion.     Cervical back: Normal range of motion and neck supple.  Skin:    General: Skin is warm and dry.     Capillary Refill: Capillary refill takes less than 2 seconds.     Findings: 2x2cm area of erythema with central opening measuring 20mm to left superior gluteal cleft. No fluctuance. No active drainage. No crepitus.  0.5x0.5cm area of induration around hair follicle in left bikini line pubic hair. No fluctuance, crepitus, surrounding erythema, or  drainage Neurological:     Mental Status: The patient is awake and alert.      ED Results / Procedures / Treatments   Labs (all labs ordered are listed, but only abnormal results are displayed) Labs Reviewed - No data to display   EKG     RADIOLOGY     PROCEDURES:  Critical Care performed:   Procedures   MEDICATIONS ORDERED IN ED: Medications - No data to display   IMPRESSION / MDM / [place] / ED COURSE  I reviewed the triage vital signs and the nursing notes.   Differential diagnosis includes, but is not limited to, ***   Patient's presentation is most consistent with {EM COPA:27473}   {If the patient is on the monitor, remove the brackets and asterisks on the sentence below and remember to document it as a Procedure as well. Otherwise delete the sentence below:1} {**The patient is on the cardiac monitor to evaluate for evidence of arrhythmia and/or significant heart rate changes.**} {Remember to include, when applicable, any/all of the following data: independent review of imaging independent review of labs (comment specifically on pertinent positives and negatives) review of specific prior hospitalizations, PCP/specialist notes, etc. discuss meds given and prescribed document any discussion with consultants (including hospitalists) any clinical decision tools you used and why (PECARN, NEXUS, etc.) did you consider admitting the patient? document social determinants of health affecting patient's care (homelessness, inability to follow up in a timely fashion, etc) document any pre-existing conditions increasing risk on current visit (e.g. diabetes and HTN increasing danger of high-risk chest pain/ACS) describes what meds you gave (especially parenteral) and why any other interventions?:1}     FINAL CLINICAL IMPRESSION(S) / ED DIAGNOSES   Final diagnoses:  None     Rx / DC Orders   ED Discharge Orders     None        Note:   This document was prepared using Dragon voice recognition software and may include unintentional dictation errors.

## 2022-01-26 LAB — MULTIPLE MYELOMA PANEL, SERUM

## 2022-04-27 ENCOUNTER — Other Ambulatory Visit: Payer: Self-pay | Admitting: Cardiology

## 2022-04-27 DIAGNOSIS — I351 Nonrheumatic aortic (valve) insufficiency: Secondary | ICD-10-CM

## 2022-04-27 DIAGNOSIS — R0789 Other chest pain: Secondary | ICD-10-CM

## 2022-04-27 DIAGNOSIS — I2510 Atherosclerotic heart disease of native coronary artery without angina pectoris: Secondary | ICD-10-CM

## 2022-05-22 ENCOUNTER — Ambulatory Visit
Admission: RE | Admit: 2022-05-22 | Discharge: 2022-05-22 | Disposition: A | Discharge status: Home / Self Care | Payer: Medicare (Managed Care) | Source: Ambulatory Visit | Attending: Cardiology | Admitting: Cardiology

## 2022-05-22 DIAGNOSIS — F17200 Nicotine dependence, unspecified, uncomplicated: Secondary | ICD-10-CM | POA: Insufficient documentation

## 2022-05-22 DIAGNOSIS — I350 Nonrheumatic aortic (valve) stenosis: Secondary | ICD-10-CM

## 2022-05-22 DIAGNOSIS — R5383 Other fatigue: Secondary | ICD-10-CM | POA: Diagnosis not present

## 2022-05-22 DIAGNOSIS — I2510 Atherosclerotic heart disease of native coronary artery without angina pectoris: Secondary | ICD-10-CM | POA: Insufficient documentation

## 2022-05-22 DIAGNOSIS — R079 Chest pain, unspecified: Secondary | ICD-10-CM | POA: Insufficient documentation

## 2022-05-22 DIAGNOSIS — E785 Hyperlipidemia, unspecified: Secondary | ICD-10-CM | POA: Insufficient documentation

## 2022-05-22 DIAGNOSIS — R0789 Other chest pain: Secondary | ICD-10-CM | POA: Insufficient documentation

## 2022-05-22 DIAGNOSIS — I10 Essential (primary) hypertension: Secondary | ICD-10-CM | POA: Diagnosis not present

## 2022-05-22 DIAGNOSIS — I083 Combined rheumatic disorders of mitral, aortic and tricuspid valves: Secondary | ICD-10-CM | POA: Diagnosis not present

## 2022-05-22 DIAGNOSIS — I351 Nonrheumatic aortic (valve) insufficiency: Secondary | ICD-10-CM | POA: Diagnosis not present

## 2022-05-22 DIAGNOSIS — I359 Nonrheumatic aortic valve disorder, unspecified: Secondary | ICD-10-CM | POA: Diagnosis present

## 2022-05-22 LAB — ECHOCARDIOGRAM COMPLETE
AR max vel: 2.17 cm2
AV Area VTI: 2.34 cm2
AV Area mean vel: 2.33 cm2
AV Mean grad: 8.3 mmHg
AV Peak grad: 17.7 mmHg
Ao pk vel: 2.1 m/s
Area-P 1/2: 3.76 cm2
MV VTI: 2.74 cm2
P 1/2 time: 418 msec
S' Lateral: 2.8 cm

## 2022-05-22 MED ORDER — REGADENOSON 0.4 MG/5ML IV SOLN
0.4000 mg | Freq: Once | INTRAVENOUS | Status: AC
  Administered 2022-05-22: 0.4 mg via INTRAVENOUS

## 2022-05-22 MED ORDER — TECHNETIUM TC 99M TETROFOSMIN IV KIT
10.0000 | PACK | Freq: Once | INTRAVENOUS | Status: AC
  Administered 2022-05-22: 10 via INTRAVENOUS

## 2022-05-22 MED ORDER — TECHNETIUM TC 99M TETROFOSMIN IV KIT
30.0000 | PACK | Freq: Once | INTRAVENOUS | Status: AC
  Administered 2022-05-22: 29.32 via INTRAVENOUS

## 2022-05-22 NOTE — Progress Notes (Signed)
*  PRELIMINARY RESULTS* Echocardiogram 2D Echocardiogram has been performed.  Catherine Lester 05/22/2022, 11:39 AM

## 2022-06-08 LAB — NM MYOCAR MULTI W/SPECT W/WALL MOTION / EF
Base ST Depression (mm): 0 mm
Estimated workload: 1
Exercise duration (min): 1 min
LV dias vol: 73 mL (ref 46–106)
LV sys vol: 19 mL
MPHR: 162 {beats}/min
Nuc Stress EF: 74 %
Peak HR: 95 {beats}/min
Percent HR: 58 %
Rest HR: 89 {beats}/min
Rest Nuclear Isotope Dose: 10 mCi
SDS: 1
SRS: 2
SSS: 0
ST Depression (mm): 0 mm
Stress Nuclear Isotope Dose: 29.3 mCi
TID: 0.88

## 2023-01-09 ENCOUNTER — Emergency Department (HOSPITAL_COMMUNITY)
Admission: EM | Admit: 2023-01-09 | Discharge: 2023-01-09 | Disposition: A | Discharge status: Home / Self Care | Payer: Medicare (Managed Care) | Attending: Emergency Medicine | Admitting: Emergency Medicine

## 2023-01-09 ENCOUNTER — Encounter (HOSPITAL_COMMUNITY): Payer: Self-pay

## 2023-01-09 ENCOUNTER — Ambulatory Visit (INDEPENDENT_AMBULATORY_CARE_PROVIDER_SITE_OTHER)
Admission: EM | Admit: 2023-01-09 | Discharge: 2023-01-09 | Disposition: A | Discharge status: Home / Self Care | Payer: Medicare (Managed Care) | Source: Home / Self Care

## 2023-01-09 ENCOUNTER — Other Ambulatory Visit: Payer: Self-pay

## 2023-01-09 ENCOUNTER — Emergency Department (HOSPITAL_COMMUNITY): Payer: Medicare (Managed Care)

## 2023-01-09 DIAGNOSIS — R1084 Generalized abdominal pain: Secondary | ICD-10-CM | POA: Diagnosis present

## 2023-01-09 DIAGNOSIS — Z7982 Long term (current) use of aspirin: Secondary | ICD-10-CM | POA: Insufficient documentation

## 2023-01-09 DIAGNOSIS — Z79899 Other long term (current) drug therapy: Secondary | ICD-10-CM | POA: Insufficient documentation

## 2023-01-09 DIAGNOSIS — N12 Tubulo-interstitial nephritis, not specified as acute or chronic: Secondary | ICD-10-CM | POA: Diagnosis not present

## 2023-01-09 DIAGNOSIS — R109 Unspecified abdominal pain: Secondary | ICD-10-CM

## 2023-01-09 DIAGNOSIS — I10 Essential (primary) hypertension: Secondary | ICD-10-CM | POA: Insufficient documentation

## 2023-01-09 LAB — URINALYSIS, ROUTINE W REFLEX MICROSCOPIC
Bilirubin Urine: NEGATIVE
Glucose, UA: NEGATIVE mg/dL
Ketones, ur: 5 mg/dL — AB
Nitrite: NEGATIVE
Protein, ur: 30 mg/dL — AB
Specific Gravity, Urine: 1.016 (ref 1.005–1.030)
WBC, UA: >50 WBC/hpf (ref 0–5)
pH: 5 (ref 5.0–8.0)

## 2023-01-09 LAB — CBC WITH DIFFERENTIAL/PLATELET
Abs Immature Granulocytes: 0.08 10*3/uL — ABNORMAL HIGH (ref 0.00–0.07)
Basophils Absolute: 0.1 10*3/uL (ref 0.0–0.1)
Basophils Relative: 0 %
Eosinophils Absolute: 0 10*3/uL (ref 0.0–0.5)
Eosinophils Relative: 0 %
HCT: 34 % — ABNORMAL LOW (ref 36.0–46.0)
Hemoglobin: 11.7 g/dL — ABNORMAL LOW (ref 12.0–15.0)
Immature Granulocytes: 1 %
Lymphocytes Relative: 6 %
Lymphs Abs: 1.1 10*3/uL (ref 0.7–4.0)
MCH: 35.1 pg — ABNORMAL HIGH (ref 26.0–34.0)
MCHC: 34.4 g/dL (ref 30.0–36.0)
MCV: 102.1 fL — ABNORMAL HIGH (ref 80.0–100.0)
Monocytes Absolute: 1.9 10*3/uL — ABNORMAL HIGH (ref 0.1–1.0)
Monocytes Relative: 10 %
Neutro Abs: 14.7 10*3/uL — ABNORMAL HIGH (ref 1.7–7.7)
Neutrophils Relative %: 83 %
Platelets: 252 10*3/uL (ref 150–400)
RBC: 3.33 MIL/uL — ABNORMAL LOW (ref 3.87–5.11)
RDW: 11.9 % (ref 11.5–15.5)
WBC: 17.8 10*3/uL — ABNORMAL HIGH (ref 4.0–10.5)
nRBC: 0 % (ref 0.0–0.2)

## 2023-01-09 LAB — COMPREHENSIVE METABOLIC PANEL
ALT: 19 U/L (ref 0–44)
AST: 17 U/L (ref 15–41)
Albumin: 2.9 g/dL — ABNORMAL LOW (ref 3.5–5.0)
Alkaline Phosphatase: 51 U/L (ref 38–126)
Anion gap: 10 (ref 5–15)
BUN: 10 mg/dL (ref 6–20)
CO2: 21 mmol/L — ABNORMAL LOW (ref 22–32)
Calcium: 8.2 mg/dL — ABNORMAL LOW (ref 8.9–10.3)
Chloride: 98 mmol/L (ref 98–111)
Creatinine, Ser: 0.87 mg/dL (ref 0.44–1.00)
GFR, Estimated: >60 mL/min (ref >60)
Glucose, Bld: 180 mg/dL — ABNORMAL HIGH (ref 70–99)
Potassium: 3.3 mmol/L — ABNORMAL LOW (ref 3.5–5.1)
Sodium: 129 mmol/L — ABNORMAL LOW (ref 135–145)
Total Bilirubin: 0.4 mg/dL (ref 0.3–1.2)
Total Protein: 5.9 g/dL — ABNORMAL LOW (ref 6.5–8.1)

## 2023-01-09 LAB — POCT URINALYSIS DIP (MANUAL ENTRY)
Glucose, UA: NEGATIVE mg/dL
Nitrite, UA: NEGATIVE
Protein Ur, POC: 30 mg/dL — AB
Spec Grav, UA: 1.01 (ref 1.010–1.025)
Urobilinogen, UA: 0.2 U/dL
pH, UA: 5.5 (ref 5.0–8.0)

## 2023-01-09 LAB — I-STAT CG4 LACTIC ACID, ED: Lactic Acid, Venous: 1.1 mmol/L (ref 0.5–1.9)

## 2023-01-09 MED ORDER — CIPROFLOXACIN HCL 500 MG PO TABS: 500.0000 mg | ORAL_TABLET | Freq: Two times a day (BID) | ORAL | 0 refills | Status: DC

## 2023-01-09 MED ORDER — CIPROFLOXACIN IN D5W 400 MG/200ML IV SOLN
400.0000 mg | Freq: Once | INTRAVENOUS | Status: AC
  Administered 2023-01-09: 400 mg via INTRAVENOUS
  Filled 2023-01-09: qty 200

## 2023-01-09 MED ORDER — KETOROLAC TROMETHAMINE 30 MG/ML IJ SOLN
30.0000 mg | Freq: Once | INTRAMUSCULAR | Status: AC
  Administered 2023-01-09: 30 mg via INTRAVENOUS
  Filled 2023-01-09: qty 1

## 2023-01-09 MED ORDER — IOHEXOL 350 MG/ML SOLN
75.0000 mL | Freq: Once | INTRAVENOUS | Status: AC | PRN
  Administered 2023-01-09: 75 mL via INTRAVENOUS

## 2023-01-09 MED ORDER — ONDANSETRON 4 MG PO TBDP: 4.0000 mg | ORAL_TABLET | Freq: Three times a day (TID) | ORAL | 0 refills | Status: DC | PRN

## 2023-01-09 MED ORDER — ONDANSETRON HCL 4 MG/2ML IJ SOLN
4.0000 mg | Freq: Once | INTRAMUSCULAR | Status: AC
  Administered 2023-01-09: 4 mg via INTRAVENOUS
  Filled 2023-01-09: qty 2

## 2023-01-09 MED ORDER — ONDANSETRON 4 MG PO TBDP: 4.0000 mg | ORAL_TABLET | Freq: Three times a day (TID) | ORAL | 0 refills | Status: AC | PRN

## 2023-01-09 MED ORDER — ACETAMINOPHEN 500 MG PO TABS
1000.0000 mg | ORAL_TABLET | Freq: Once | ORAL | Status: AC
  Administered 2023-01-09: 1000 mg via ORAL
  Filled 2023-01-09: qty 2

## 2023-01-09 MED ORDER — SODIUM CHLORIDE 0.9 % IV BOLUS
1000.0000 mL | Freq: Once | INTRAVENOUS | Status: AC
  Administered 2023-01-09: 1000 mL via INTRAVENOUS

## 2023-01-09 MED ORDER — IBUPROFEN 400 MG PO TABS
600.0000 mg | ORAL_TABLET | Freq: Once | ORAL | Status: AC
  Administered 2023-01-09: 600 mg via ORAL
  Filled 2023-01-09: qty 1

## 2023-01-09 MED ORDER — MORPHINE SULFATE (PF) 4 MG/ML IV SOLN
4.0000 mg | Freq: Once | INTRAVENOUS | Status: AC
  Administered 2023-01-09: 4 mg via INTRAVENOUS
  Filled 2023-01-09: qty 1

## 2023-01-09 MED ORDER — NITROFURANTOIN MONOHYD MACRO 100 MG PO CAPS: 100.0000 mg | ORAL_CAPSULE | Freq: Two times a day (BID) | ORAL | 0 refills | Status: AC

## 2023-01-09 MED ORDER — CIPROFLOXACIN HCL 500 MG PO TABS: 500.0000 mg | ORAL_TABLET | Freq: Two times a day (BID) | ORAL | 0 refills | Status: AC

## 2023-01-09 NOTE — Discharge Instructions (Signed)
Your urinalysis shows Deeana Atwater blood cells but at this time it does not show bacteria, your urine will be sent to the lab to determine exactly which bacteria is present, if any changes need to be made to your medications you will be notified  It is possible that your symptoms are also related to a kidney stone, please monitor, some kidney stones if small enough are able to pass on their own however during this time they are able to cause severe pain, vomiting, urinary changes such as frequency, discomfort and blood  Begin use of Macrobid every morning and every evening for 5 days  You may use over-the-counter Pyridium to help minimize your symptoms until antibiotic removes bacteria, this medication will turn your urine orange  Increase your fluid intake through use of water  As always practice good hygiene, wiping front to back and avoidance of scented vaginal products to prevent further irritation  If symptoms continue to persist after use of medication or recur please follow-up with urgent care or your primary doctor as needed  Any point if your side pain worsens in severity, you start to notice large amounts of blood in your urine, you begin vomiting persistently or your fever does not resolve with use of Tylenol or Motrin please go to the nearest emergency department for immediate evaluation

## 2023-01-09 NOTE — ED Provider Triage Note (Signed)
Emergency Medicine Provider Triage Evaluation Note  Catherine Lester , a 57 y.o. female  was evaluated in triage.  Pt complains of right kidney pain, max temp 103.8. Pain started 1 week ago, thought possible back spasm. 4 days ago with temps 99, fevers started yesterday. No hx of stones. Went to UC, ua with blood and wbc but no bacteria. No hx of stones  Review of Systems  Positive: Nausea  Negative: Vomiting, abd pain, dysuria   Physical Exam  BP 111/70 (BP Location: Right Arm)   Pulse 98   Temp (!) 100.7 F (38.2 C)   Resp 18   SpO2 95%  Gen:   Awake, no distress   Resp:  Normal effort  MSK:   Moves extremities without difficulty  Other:    Medical Decision Making  Medically screening exam initiated at 3:30 PM.  Appropriate orders placed.  Kharis Schuneman was informed that the remainder of the evaluation will be completed by another provider, this initial triage assessment does not replace that evaluation, and the importance of remaining in the ED until their evaluation is complete.     Jeannie Fend, PA-C 01/09/23 1531

## 2023-01-09 NOTE — ED Triage Notes (Addendum)
Patient to Urgent Care with complaints of fevers and right sided flank pain. Pain radiates into side. Describes constant, dull pain. At times has sharp pains. Relieved when laying on her right side. Denies any dysuria/ no urinary symptoms.   Reports symptoms started approx 4 days ago. Max temps 102. Taking tylenol and motrin.

## 2023-01-09 NOTE — ED Provider Notes (Signed)
Catherine Lester    CSN: [id number] Arrival date & time: 01/09/23  1048      History   Chief Complaint Chief Complaint  Patient presents with   Fever   Flank Pain    HPI Catherine Lester is a 57 y.o. female.   Patient presents for evaluation of right-sided flank pain beginning 4 days ago, began to experience fevers 2 days ago picking up 102.  Initially thought was muscular strain of the right lower back but pain began to radiate to the right kidney and has been present at that location since.  Pain has been constant fluctuating in intensity, described as dull and aching with intermittent sharp pains.  improves when laying down leaning to the right side.  Has been taking Tylenol and Motrin which has helped to manage fever.  Denies urinary symptoms or vaginal symptoms, nausea vomiting or diarrhea, URI symptoms.  Denies history of UTI or kidney stone.  Past Medical History:  Diagnosis Date   Anxiety    Arthritis    Fibromyalgia    High cholesterol    Hx of heart valve insufficiency    Hypertension    Thyroid disease    Urinary incontinence     Patient Active Problem List   Diagnosis Date Noted   Raynaud's disease 04/28/2021   Familial hyperlipidemia 04/28/2021   Anxiety 04/28/2021   Substance induced mood disorder ([facility]) 04/28/2021   History of schizoaffective disorder 04/28/2021   Cannabis use disorder, moderate, dependence ([facility]) 04/28/2021   Leukocytosis 07/07/2020   Erythrocytosis 07/07/2020   Hx of cervical cancer 06/24/2020   Mixed stress and urge urinary incontinence 04/04/2018   Lumbosacral spondylosis without myelopathy 12/06/2016   Chronic bilateral low back pain 12/06/2016   Failed back syndrome, cervical 12/06/2016   RUQ abdominal pain 08/31/2016   Other chest pain 04/07/2016   Moderate aortic valve insufficiency 04/07/2016   Familial hypercholesterolemia 04/07/2016   Schizoaffective disorder, bipolar type ([facility]) 02/07/2016   Alcohol use disorder,  severe, dependence ([facility]) 01/20/2016   Alcohol withdrawal ([facility]) 01/20/2016   Cannabis use disorder, mild, abuse 01/20/2016   Tobacco use disorder 01/20/2016   HTN (hypertension) 01/20/2016   Hypothyroidism 01/20/2016   Opioid use disorder, moderate, dependence ([facility]) 01/20/2016   Positive ANA (antinuclear antibody) 07/16/2014   Cough 07/16/2014   Chronic fatigue 07/16/2014   Arthralgia 07/16/2014    Past Surgical History:  Procedure Laterality Date   ANTERIOR FUSION CERVICAL SPINE     C5, C6   BACK SURGERY     CHOLECYSTECTOMY     TONSILLECTOMY     TUBAL LIGATION      OB History   No obstetric history on file.      Home Medications    Prior to Admission medications   Medication Sig Start Date End Date Taking? Authorizing Provider  cyclobenzaprine (FLEXERIL) 10 MG tablet Take 1 tablet by mouth 3 (three) times daily as needed. 12/19/22 12/19/23 Yes [provider]  levothyroxine (SYNTHROID) 112 MCG tablet Take by mouth. 12/19/22 12/19/23 Yes [provider]  lisinopril (ZESTRIL) 5 MG tablet Take by mouth. 12/19/22 12/19/23 Yes [provider]  nitrofurantoin, macrocrystal-monohydrate, (MACROBID) 100 MG capsule Take 1 capsule (100 mg total) by mouth 2 (two) times daily. 01/09/23  Yes Naavya Postma R, NP  rosuvastatin (CRESTOR) 20 MG tablet Take by mouth. 12/19/22 12/19/23 Yes [provider]  Turmeric (QC TUMERIC COMPLEX PO) Take by mouth.   Yes [provider]  albuterol (PROVENTIL) (2.5 MG/3ML) 0.083%  nebulizer solution Take 3 mLs by nebulization every 6 hours as needed for Wheezing or Shortness of Breath 11/17/13   [provider]  aspirin 81 MG chewable tablet Chew by mouth daily.    [provider]  atorvastatin (LIPITOR) 20 MG tablet Take by mouth. Patient not taking: Reported on 07/15/2020    [provider]  cetirizine (ZYRTEC) 10 MG tablet Take 1 tablet by mouth daily. 08/27/18   [provider]   chlorpheniramine-HYDROcodone (TUSSIONEX PENNKINETIC ER) 10-8 MG/5ML SUER Take 5 mLs by mouth 2 (two) times daily. Patient not taking: Reported on 11/04/2019 11/05/18   [name], PA-C  citalopram (CELEXA) 20 MG tablet Take 20 mg by mouth daily. Patient not taking: Reported on 11/04/2019    [provider]  clindamycin (CLEOCIN-T) 1 % lotion Apply to affected area  QD after shower. Patient not taking: Reported on 07/07/2020 11/04/19   Willeen Niece, MD  divalproex (DEPAKOTE ER) 500 MG 24 hr tablet Take 3 tablets (1,500 mg total) by mouth at bedtime. Patient not taking: Reported on 11/04/2019 02/07/16   Jimmy Footman, MD  EUTHYROX 112 MCG tablet Take 112 mcg by mouth every morning. Patient not taking: Reported on 07/07/2020 04/22/20   [provider]  fexofenadine-pseudoephedrine (ALLEGRA-D) 60-120 MG 12 hr tablet Take 1 tablet by mouth 2 (two) times daily. Patient not taking: Reported on 04/28/2021 11/05/18   [name], PA-C  levothyroxine (SYNTHROID) 112 MCG tablet Take by mouth. Patient not taking: Reported on 04/28/2021 11/19/20 11/19/21  [provider]  levothyroxine (SYNTHROID, LEVOTHROID) 125 MCG tablet Take 125 mcg by mouth daily before breakfast.    [provider]  lisinopril (PRINIVIL,ZESTRIL) 20 MG tablet Take 20 mg by mouth daily. Patient not taking: Reported on 04/28/2021    [provider]  lisinopril (ZESTRIL) 2.5 MG tablet Take 5 mg by mouth daily. 03/01/21   [provider]  naproxen (NAPROSYN) 500 MG tablet Take 1 tablet (500 mg total) by mouth 2 (two) times daily with a meal. 11/05/18   [name], PA-C  OLANZapine (ZYPREXA) 15 MG tablet Take 2 tablets (30 mg total) by mouth at bedtime. Patient not taking: Reported on 11/04/2019 02/07/16   Jimmy Footman, MD  sertraline (ZOLOFT) 50 MG tablet Take by mouth. Patient not taking: No sig reported 09/24/19 09/23/20  [provider]  simvastatin  (ZOCOR) 20 MG tablet Take 1 tablet (20 mg total) by mouth daily at 6 PM. Patient not taking: Reported on 07/07/2020 02/07/16   Jimmy Footman, MD  simvastatin (ZOCOR) 40 MG tablet Take 40 mg by mouth at bedtime. 04/22/20   [provider]  spironolactone (ALDACTONE) 25 MG tablet Take 1 tablet by mouth daily. Patient not taking: Reported on 07/07/2020 05/11/16   [provider]  vitamin B-12 1000 MCG tablet Take 1 tablet (1,000 mcg total) by mouth daily. Patient not taking: Reported on 11/04/2019 02/07/16   Jimmy Footman, MD    Family History Family History  Problem Relation Age of Onset   Anxiety disorder Mother    Anxiety disorder Brother     Social History Social History   Tobacco Use   Smoking status: Every Day    Current packs/day: 1.00    Average packs/day: 1 pack/day for 25.0 years (25.0 ttl pk-yrs)    Types: Cigarettes   Smokeless tobacco: Never  Vaping Use   Vaping status: Never Used  Substance Use Topics   Alcohol use: Yes    Alcohol/week:  8.0 standard drinks of alcohol    Types: 8 Cans of beer per week    Comment: 3-4 times weekly ,6-8 per day   Drug use: Not Currently    Types: Marijuana     Allergies   Ancef [cefazolin], Ceclor [cefaclor], Demerol [meperidine], Erythromycin, and Keflex [cephalexin]   Review of Systems Review of Systems   Physical Exam Triage Vital Signs ED Triage Vitals  Encounter Vitals Group     BP 01/09/23 1101 128/80     Systolic BP Percentile --      Diastolic BP Percentile --      Pulse Rate 01/09/23 1101 91     Resp 01/09/23 1101 19     Temp 01/09/23 1101 99.9 F (37.7 C)     Temp src --      SpO2 01/09/23 1101 97 %     Weight --      Height --      Head Circumference --      Peak Flow --      Pain Score 01/09/23 1053 4     Pain Loc --      Pain Education --      Exclude from Growth Chart --    No data found.  Updated Vital Signs BP 128/80   Pulse 91   Temp 99.9 F (37.7  C)   Resp 19   SpO2 97%   Visual Acuity Right Eye Distance:   Left Eye Distance:   Bilateral Distance:    Right Eye Near:   Left Eye Near:    Bilateral Near:     Physical Exam Constitutional:      Appearance: Normal appearance.  Eyes:     Extraocular Movements: Extraocular movements intact.  Pulmonary:     Effort: Pulmonary effort is normal.  Abdominal:     General: Abdomen is flat. Bowel sounds are normal.     Palpations: Abdomen is soft.     Tenderness: There is right CVA tenderness.  Neurological:     Mental Status: She is alert and oriented to person, place, and time. Mental status is at baseline.      UC Treatments / Results  Labs (all labs ordered are listed, but only abnormal results are displayed) Labs Reviewed  POCT URINALYSIS DIP (MANUAL ENTRY) - Abnormal; Notable for the following components:      Result Value   Color, UA straw (*)    Clarity, UA cloudy (*)    Bilirubin, UA small (*)    Ketones, POC UA trace (5) (*)    Blood, UA small (*)    Protein Ur, POC =30 (*)    Leukocytes, UA Trace (*)    All other components within normal limits  URINE CULTURE    EKG   Radiology No results found.  Procedures Procedures (including critical care time)  Medications Ordered in UC Medications - No data to display  Initial Impression / Assessment and Plan / UC Course  I have reviewed the triage vital signs and the nursing notes.  Pertinent labs & imaging results that were available during my care of the patient were reviewed by me and considered in my medical decision making (see chart for details).  Right flank plain  Vital signs are stable, low-grade fever of 99.9 noted in triage, patient is in no signs of distress nontoxic-appearing, right CVA tenderness noted on exam, no tenderness to the abdomen, urinalysis showing leukocytes and hematuria negative for nitrates, sent for culture as  patient has been experiencing fever initiating antibiotic, etiology  most likely infection versus kidney stone causing infection, discussed, Macrobid sent to pharmacy recommended continue over-the-counter analgesics for management of fever, given strict emergency room precautions and advised follow-up if do not worsen with urgent care if symptoms continue to persist but do not worsen Final Clinical Impressions(s) / UC Diagnoses   Final diagnoses:  Right flank pain     Discharge Instructions      Your urinalysis shows Jenea Dake blood cells but at this time it does not show bacteria, your urine will be sent to the lab to determine exactly which bacteria is present, if any changes need to be made to your medications you will be notified  It is possible that your symptoms are also related to a kidney stone, please monitor, some kidney stones if small enough are able to pass on their own however during this time they are able to cause severe pain, vomiting, urinary changes such as frequency, discomfort and blood  Begin use of Macrobid every morning and every evening for 5 days  You may use over-the-counter Pyridium to help minimize your symptoms until antibiotic removes bacteria, this medication will turn your urine orange  Increase your fluid intake through use of water  As always practice good hygiene, wiping front to back and avoidance of scented vaginal products to prevent further irritation  If symptoms continue to persist after use of medication or recur please follow-up with urgent care or your primary doctor as needed  Any point if your side pain worsens in severity, you start to notice large amounts of blood in your urine, you begin vomiting persistently or your fever does not resolve with use of Tylenol or Motrin please go to the nearest emergency department for immediate evaluation    ED Prescriptions     Medication Sig Dispense Auth. Provider   nitrofurantoin, macrocrystal-monohydrate, (MACROBID) 100 MG capsule Take 1 capsule (100 mg total) by mouth  2 (two) times daily. 10 capsule Valinda Hoar, NP      PDMP not reviewed this encounter.   Valinda Hoar, NP 01/09/23 1139

## 2023-01-09 NOTE — Discharge Instructions (Signed)
It was a pleasure taking care of you here in the emergency department today  As we discussed in the room you have an infection in your kidney called pyelonephritis.  Recommend alternating Tylenol and ibuprofen at home for fever and pain  I have written you for antibiotics to help with your infection  I have also written for Zofran to help with your nausea  Make sure to follow-up with your primary care provider in the next few days  Return for new or worsening symptoms

## 2023-01-09 NOTE — ED Provider Notes (Cosign Needed Addendum)
[place] EMERGENCY DEPARTMENT AT [facility] Provider Note   CSN: [id number] Arrival date & time: 01/09/23  1502    History  Chief Complaint  Patient presents with   Flank Pain    Catherine Lester is a 57 y.o. female history of hypertension, cannabinoid use, EtOH use, chronic leukocytosis here for evaluation of right side abdominal pain.  Started 4 days ago.  Starts in flank works the way around the central abdomen.  Fever up to 102 which began 2 days ago.  Pain constant, fluctuates in intensity.  Tylenol Motrin at home, last Tylenol, 650 mg at 12.  No chest pain, shortness of breath, dysuria, hematuria, congestion, rhinorrhea, midline back pain, bowel or bladder incontinence, saddle paresthesia, history of IV drug use, diarrhea.  Has previously been followed by heme-onc for chronic leukocytosis.  She is allergy to penicillins and cephalosporins.  No sick contacts.  No prior history of kidney stones.  Seen by urgent care, urine sent for culture, they started her on Macrobid which she has not picked up. She states she was told she had blood in her Urine at [facility]    HPI     Home Medications Prior to Admission medications   Medication Sig Start Date End Date Taking? Authorizing Provider  ciprofloxacin (CIPRO) 500 MG tablet Take 1 tablet (500 mg total) by mouth every 12 (twelve) hours. 01/09/23  Yes Skiler Tye A, PA-C  ondansetron (ZOFRAN-ODT) 4 MG disintegrating tablet Take 1 tablet (4 mg total) by mouth every 8 (eight) hours as needed. 01/09/23  Yes Jacari Kirsten A, PA-C  albuterol (PROVENTIL) (2.5 MG/3ML) 0.083% nebulizer solution Take 3 mLs by nebulization every 6 hours as needed for Wheezing or Shortness of Breath 11/17/13   [provider]  aspirin 81 MG chewable tablet Chew by mouth daily.    [provider]  atorvastatin (LIPITOR) 20 MG tablet Take by mouth. Patient not taking: Reported on 07/15/2020    [provider]  cetirizine  (ZYRTEC) 10 MG tablet Take 1 tablet by mouth daily. 08/27/18   [provider]  chlorpheniramine-HYDROcodone (TUSSIONEX PENNKINETIC ER) 10-8 MG/5ML SUER Take 5 mLs by mouth 2 (two) times daily. Patient not taking: Reported on 11/04/2019 11/05/18   [name], PA-C  citalopram (CELEXA) 20 MG tablet Take 20 mg by mouth daily. Patient not taking: Reported on 11/04/2019    [provider]  clindamycin (CLEOCIN-T) 1 % lotion Apply to affected area  QD after shower. Patient not taking: Reported on 07/07/2020 11/04/19   Willeen Niece, MD  cyclobenzaprine (FLEXERIL) 10 MG tablet Take 1 tablet by mouth 3 (three) times daily as needed. 12/19/22 12/19/23  [provider]  divalproex (DEPAKOTE ER) 500 MG 24 hr tablet Take 3 tablets (1,500 mg total) by mouth at bedtime. Patient not taking: Reported on 11/04/2019 02/07/16   Jimmy Footman, MD  EUTHYROX 112 MCG tablet Take 112 mcg by mouth every morning. Patient not taking: Reported on 07/07/2020 04/22/20   [provider]  fexofenadine-pseudoephedrine (ALLEGRA-D) 60-120 MG 12 hr tablet Take 1 tablet by mouth 2 (two) times daily. Patient not taking: Reported on 04/28/2021 11/05/18   [name], PA-C  levothyroxine (SYNTHROID) 112 MCG tablet Take by mouth. Patient not taking: Reported on 04/28/2021 11/19/20 11/19/21  [provider]  levothyroxine (SYNTHROID) 112 MCG tablet Take by mouth. 12/19/22 12/19/23  [provider]  levothyroxine (SYNTHROID, LEVOTHROID) 125 MCG tablet Take 125 mcg by mouth daily before breakfast.  [provider]  lisinopril (PRINIVIL,ZESTRIL) 20 MG tablet Take 20 mg by mouth daily. Patient not taking: Reported on 04/28/2021    [provider]  lisinopril (ZESTRIL) 2.5 MG tablet Take 5 mg by mouth daily. 03/01/21   [provider]  lisinopril (ZESTRIL) 5 MG tablet Take by mouth. 12/19/22 12/19/23  [provider]  naproxen (NAPROSYN) 500 MG tablet  Take 1 tablet (500 mg total) by mouth 2 (two) times daily with a meal. 11/05/18   [name], PA-C  nitrofurantoin, macrocrystal-monohydrate, (MACROBID) 100 MG capsule Take 1 capsule (100 mg total) by mouth 2 (two) times daily. 01/09/23   White, Elita Boone, NP  OLANZapine (ZYPREXA) 15 MG tablet Take 2 tablets (30 mg total) by mouth at bedtime. Patient not taking: Reported on 11/04/2019 02/07/16   Jimmy Footman, MD  rosuvastatin (CRESTOR) 20 MG tablet Take by mouth. 12/19/22 12/19/23  [provider]  sertraline (ZOLOFT) 50 MG tablet Take by mouth. Patient not taking: No sig reported 09/24/19 09/23/20  [provider]  simvastatin (ZOCOR) 20 MG tablet Take 1 tablet (20 mg total) by mouth daily at 6 PM. Patient not taking: Reported on 07/07/2020 02/07/16   Jimmy Footman, MD  simvastatin (ZOCOR) 40 MG tablet Take 40 mg by mouth at bedtime. 04/22/20   [provider]  spironolactone (ALDACTONE) 25 MG tablet Take 1 tablet by mouth daily. Patient not taking: Reported on 07/07/2020 05/11/16   [provider]  Turmeric (QC TUMERIC COMPLEX PO) Take by mouth.    [provider]  vitamin B-12 1000 MCG tablet Take 1 tablet (1,000 mcg total) by mouth daily. Patient not taking: Reported on 11/04/2019 02/07/16   Jimmy Footman, MD      Allergies    Ancef [cefazolin], Ceclor [cefaclor], Demerol [meperidine], Erythromycin, and Keflex [cephalexin]    Review of Systems   Review of Systems  Constitutional:  Positive for fatigue and fever.  HENT: Negative.    Respiratory: Negative.    Cardiovascular: Negative.   Gastrointestinal:  Positive for abdominal pain. Negative for abdominal distention, anal bleeding, blood in stool, constipation, diarrhea, nausea, rectal pain and vomiting.  Genitourinary:  Positive for flank pain. Negative for decreased urine volume, difficulty urinating, dysuria, enuresis, frequency, genital sores, hematuria,  menstrual problem, pelvic pain, urgency, vaginal bleeding, vaginal discharge and vaginal pain.  Skin: Negative.   Neurological: Negative.   All other systems reviewed and are negative.   Physical Exam Updated Vital Signs BP (!) 105/56 (BP Location: Right Arm)   Pulse 70   Temp 98.1 F (36.7 C) (Oral)   Resp (!) 97   Ht 5\' 2"  (1.575 m)   Wt 68 kg   SpO2 100%   BMI 27.42 kg/m  Physical Exam Vitals and nursing note reviewed.  Constitutional:      General: She is not in acute distress.    Appearance: She is well-developed. She is not ill-appearing, toxic-appearing or diaphoretic.  HENT:     Head: Atraumatic.  Eyes:     Pupils: Pupils are equal, round, and reactive to Cathy.  Cardiovascular:     Rate and Rhythm: Normal rate.     Pulses: Normal pulses.          Radial pulses are 2+ on the right side and 2+ on the left side.       Dorsalis pedis pulses are 2+ on the left side.     Heart sounds: Normal heart sounds.  Pulmonary:  Effort: Pulmonary effort is normal. No respiratory distress.     Breath sounds: Normal breath sounds.  Abdominal:     General: Bowel sounds are normal. There is no distension.     Tenderness: There is abdominal tenderness. There is right CVA tenderness.     Comments: Soft, diffuse tenderness to right abdomen positive CVA tap on right  Musculoskeletal:        General: No swelling, tenderness, deformity or signs of injury. Normal range of motion.     Cervical back: Normal range of motion.     Right lower leg: No edema.     Left lower leg: No edema.  Skin:    General: Skin is warm and dry.     Capillary Refill: Capillary refill takes less than 2 seconds.  Neurological:     General: No focal deficit present.     Mental Status: She is alert.  Psychiatric:        Mood and Affect: Mood normal.    ED Results / Procedures / Treatments   Labs (all labs ordered are listed, but only abnormal results are displayed) Labs Reviewed  CBC WITH  DIFFERENTIAL/PLATELET - Abnormal; Notable for the following components:      Result Value   WBC 17.8 (*)    RBC 3.33 (*)    Hemoglobin 11.7 (*)    HCT 34.0 (*)    MCV 102.1 (*)    MCH 35.1 (*)    Neutro Abs 14.7 (*)    Monocytes Absolute 1.9 (*)    Abs Immature Granulocytes 0.08 (*)    All other components within normal limits  COMPREHENSIVE METABOLIC PANEL - Abnormal; Notable for the following components:   Sodium 129 (*)    Potassium 3.3 (*)    CO2 21 (*)    Glucose, Bld 180 (*)    Calcium 8.2 (*)    Total Protein 5.9 (*)    Albumin 2.9 (*)    All other components within normal limits  URINALYSIS, ROUTINE W REFLEX MICROSCOPIC - Abnormal; Notable for the following components:   APPearance HAZY (*)    Hgb urine dipstick MODERATE (*)    Ketones, ur 5 (*)    Protein, ur 30 (*)    Leukocytes,Ua LARGE (*)    Bacteria, UA MANY (*)    All other components within normal limits  CULTURE, BLOOD (ROUTINE X 2)  CULTURE, BLOOD (ROUTINE X 2)  URINE CULTURE  I-STAT CG4 LACTIC ACID, ED    EKG None  Radiology CT ABDOMEN PELVIS W CONTRAST  Result Date: 01/09/2023 CLINICAL DATA:  Acute abdominal pain EXAM: CT ABDOMEN AND PELVIS WITH CONTRAST TECHNIQUE: Multidetector CT imaging of the abdomen and pelvis was performed using the standard protocol following bolus administration of intravenous contrast. RADIATION DOSE REDUCTION: This exam was performed according to the departmental dose-optimization program which includes automated exposure control, adjustment of the mA and/or kV according to patient size and/or use of iterative reconstruction technique. CONTRAST:  75mL OMNIPAQUE IOHEXOL 350 MG/ML SOLN COMPARISON:  None Available. FINDINGS: Lower chest: No acute abnormality. Hepatobiliary: No focal liver abnormality is seen. Status post cholecystectomy. No biliary dilatation. Pancreas: Unremarkable. No pancreatic ductal dilatation or surrounding inflammatory changes. Spleen: Normal in size without  focal abnormality. Adrenals/Urinary Tract: Patchy areas of cortical hypodensity are seen throughout the right kidney suspicious for pyelonephritis. There is mild right perinephric fat stranding. There is also wall enhancement of the right ureter. There is no hydronephrosis or perinephric fluid collection. The  left kidney, ureters and bladder are within normal limits. Stomach/Bowel: Stomach is within normal limits. Appendix appears normal. There is some wall thickening and inflammation of the terminal ileum. There is no bowel obstruction, pneumatosis or free air. Vascular/Lymphatic: Aortic atherosclerosis. No enlarged abdominal or pelvic lymph nodes. Reproductive: Uterus and bilateral adnexa are unremarkable. Other: No abdominal wall hernia or abnormality. No abdominopelvic ascites. Musculoskeletal: No acute or significant osseous findings. IMPRESSION: 1. Findings compatible with right-sided pyelonephritis and ureteritis. No hydronephrosis or perinephric fluid collection. 2. Wall thickening and inflammation of the terminal ileum compatible with infectious or inflammatory enteritis. 3. Aortic atherosclerosis. Aortic Atherosclerosis (ICD10-I70.0). Electronically Signed   By: Darliss Cheney M.D.   On: 01/09/2023 22:35    Procedures Procedures    Medications Ordered in ED Medications  ibuprofen (ADVIL) tablet 600 mg (has no administration in time range)  ciprofloxacin (CIPRO) IVPB 400 mg (0 mg Intravenous Stopped 01/09/23 2009)  morphine (PF) 4 MG/ML injection 4 mg (4 mg Intravenous Given 01/09/23 1857)  ondansetron (ZOFRAN) injection 4 mg (4 mg Intravenous Given 01/09/23 1857)  sodium chloride 0.9 % bolus 1,000 mL (0 mLs Intravenous Stopped 01/09/23 2011)  ketorolac (TORADOL) 30 MG/ML injection 30 mg (30 mg Intravenous Given 01/09/23 1857)  acetaminophen (TYLENOL) tablet 1,000 mg (1,000 mg Oral Given 01/09/23 1856)  iohexol (OMNIPAQUE) 350 MG/ML injection 75 mL (75 mLs Intravenous Contrast Given 01/09/23  2029)   ED Course/ Medical Decision Making/ A&P   57 year old here for evaluation of right flank and right abdominal pain which began 4 to 5 days ago.  Developed fever 2 days ago.  No UTI symptoms.  No chest pain, shortness of breath.  No recent falls or injuries.  No midline back pain, bowel or bladder incontinence, saddle paresthesia, history of IV drug use.  Seen by urgent care who noted blood in her urine they subsequently started her on Macrobid today which patient has not picked up.  Arrival patient is febrile, not tachycardic, tachypneic or hypoxic.  She has positive CVA tap on right as well as diffuse abdominal pain.  Of note patient states she does have chronic leukocytosis previously followed by heme-onc.  Will plan on labs, imaging and reassess  Labs and imaging personally viewed and interpreted:  CBC leukocytosis 17.8, hemoglobin 11.7 Metabolic panel sodium 129, potassium 3.3 UA positive for UTI-- culture sent Lactic 1.1 BC pending CT AP pending  2110: Reassessed, pain controlled, defervesced with Tylenol.  Awaiting official radiology read of CT scan  Patient reassessed.  She does have a mild headache and requesting oral ibuprofen.  She is tolerating p.o. intake.  CT scan shows pyelonephritis.  Discussed results with patient, family in room.  Will DC home, have her follow-up outpatient, return for any worsening symptoms  Patient is nontoxic, nonseptic appearing, in no apparent distress.  Patient's pain and other symptoms adequately managed in emergency department.  Fluid bolus given.  Labs, imaging and vitals reviewed.  Patient does not meet the SIRS or Sepsis criteria.  On repeat exam patient does not have a surgical abdomin and there are no peritoneal signs.  No indication of appendicitis, bowel obstruction, bowel perforation, cholecystitis, diverticulitis, PID or ectopic pregnancy.  Patient discharged home with symptomatic treatment and given strict instructions for follow-up with  their primary care physician.  I have also discussed reasons to return immediately to the ER.  Patient expresses understanding and agrees with plan.      Click here for ABCD2, HEART and other calculatorsREFRESH Note before  signing :1}                              Medical Decision Making Amount and/or Complexity of Data Reviewed External Data Reviewed: labs, radiology and notes. Labs: ordered. Decision-making details documented in ED Course. Radiology: ordered and independent interpretation performed. Decision-making details documented in ED Course.  Risk OTC drugs. Prescription drug management. Parenteral controlled substances. Diagnosis or treatment significantly limited by social determinants of health.     Final Clinical Impression(s) / ED Diagnoses Final diagnoses:  Pyelonephritis    Rx / DC Orders ED Discharge Orders          Ordered    ciprofloxacin (CIPRO) 500 MG tablet  Every 12 hours        01/09/23 2248    ondansetron (ZOFRAN-ODT) 4 MG disintegrating tablet  Every 8 hours PRN        01/09/23 2248                  Deedra Pro A, PA-C 01/09/23 2309    Jacalyn Lefevre, MD 01/10/23 [phone]

## 2023-01-09 NOTE — ED Triage Notes (Signed)
Patient reports R flank pain x 1 week with fever since yesterday.

## 2023-01-12 LAB — URINE CULTURE: Culture: 70000 — AB

## 2023-01-14 LAB — CULTURE, BLOOD (ROUTINE X 2)
Culture: NO GROWTH
Culture: NO GROWTH
Special Requests: ADEQUATE

## 2023-01-25 ENCOUNTER — Other Ambulatory Visit: Payer: Self-pay | Admitting: Obstetrics and Gynecology

## 2023-01-25 DIAGNOSIS — Z1231 Encounter for screening mammogram for malignant neoplasm of breast: Secondary | ICD-10-CM

## 2023-03-19 ENCOUNTER — Other Ambulatory Visit: Payer: Self-pay | Admitting: Pediatrics

## 2023-03-19 DIAGNOSIS — Z122 Encounter for screening for malignant neoplasm of respiratory organs: Secondary | ICD-10-CM

## 2023-03-19 DIAGNOSIS — F17210 Nicotine dependence, cigarettes, uncomplicated: Secondary | ICD-10-CM

## 2023-11-01 ENCOUNTER — Other Ambulatory Visit: Payer: Self-pay

## 2023-11-01 ENCOUNTER — Emergency Department (HOSPITAL_COMMUNITY)
Admission: EM | Admit: 2023-11-01 | Discharge: 2023-11-02 | Disposition: A | Discharge status: Home / Self Care | Attending: Emergency Medicine | Admitting: Emergency Medicine

## 2023-11-01 ENCOUNTER — Encounter (HOSPITAL_COMMUNITY): Payer: Self-pay

## 2023-11-01 ENCOUNTER — Emergency Department (HOSPITAL_COMMUNITY)

## 2023-11-01 DIAGNOSIS — W1830XA Fall on same level, unspecified, initial encounter: Secondary | ICD-10-CM | POA: Diagnosis not present

## 2023-11-01 DIAGNOSIS — I10 Essential (primary) hypertension: Secondary | ICD-10-CM | POA: Insufficient documentation

## 2023-11-01 DIAGNOSIS — S42212A Unspecified displaced fracture of surgical neck of left humerus, initial encounter for closed fracture: Secondary | ICD-10-CM | POA: Insufficient documentation

## 2023-11-01 DIAGNOSIS — Z7982 Long term (current) use of aspirin: Secondary | ICD-10-CM | POA: Insufficient documentation

## 2023-11-01 DIAGNOSIS — F17200 Nicotine dependence, unspecified, uncomplicated: Secondary | ICD-10-CM | POA: Diagnosis not present

## 2023-11-01 DIAGNOSIS — S42302A Unspecified fracture of shaft of humerus, left arm, initial encounter for closed fracture: Secondary | ICD-10-CM

## 2023-11-01 DIAGNOSIS — Z79899 Other long term (current) drug therapy: Secondary | ICD-10-CM | POA: Insufficient documentation

## 2023-11-01 DIAGNOSIS — M79602 Pain in left arm: Secondary | ICD-10-CM | POA: Diagnosis present

## 2023-11-01 NOTE — ED Triage Notes (Signed)
 Pt coming in after a fall on Monday evening. Pt fell on the steps going up. Pt reports bruising on her chest close to her armpit. Pt reports pain of about 6/10 when it is immobilized. With movement pain increases to 10/10.

## 2023-11-01 NOTE — ED Triage Notes (Signed)
 L arm pain after mechanical fall. No thinners. Did not hit head. No LOC.

## 2023-11-01 NOTE — ED Provider Triage Note (Signed)
 Emergency Medicine Provider Triage Evaluation Note  Catherine Lester , a 58 y.o. female  was evaluated in triage.  Pt complains of fall going up the steps.  Bruising in left armpit, left upper arm.  Reports pain 6/10 when immobilized, but 10/10 with movement.  Denies any chest pain, head injury, loss of consciousness..  Review of Systems  Positive: Shoulder pain, hematoma Negative:   Physical Exam  BP (!) 147/61 (BP Location: Right Arm)   Pulse (!) 102   Temp 98.9 F (37.2 C) (Oral)   Resp 20   Ht 5' 2 (1.575 m)   Wt 61.2 kg   SpO2 100%   BMI 24.69 kg/m  Gen:   Awake, no distress   Resp:  Normal effort  MSK:   Moves extremities without difficulty  Other:  Large hematoma, no obvious stepoff, decreased ROM to flexion / extension of the affected LUE  Medical Decision Making  Medically screening exam initiated at 3:20 PM.  Appropriate orders placed.  Catherine Lester was informed that the remainder of the evaluation will be completed by another provider, this initial triage assessment does not replace that evaluation, and the importance of remaining in the ED until their evaluation is complete.  Workup initiated in triage    Catherine Sherlean DEL, PA-C 11/01/23 1520

## 2023-11-02 MED ORDER — OXYCODONE-ACETAMINOPHEN 5-325 MG PO TABS
2.0000 | ORAL_TABLET | Freq: Once | ORAL | Status: AC
  Administered 2023-11-02: 2 via ORAL
  Filled 2023-11-02: qty 2

## 2023-11-02 MED ORDER — HYDROCODONE-ACETAMINOPHEN 5-325 MG PO TABS: 1.0000 | ORAL_TABLET | ORAL | 0 refills | Status: DC | PRN

## 2023-11-02 NOTE — ED Provider Notes (Signed)
 [facility]GENCY DEPARTMENT AT [facility] Provider Note   CSN: [id number] Arrival date & time: 11/01/23  1420     Patient presents with: Arm Injury and Fall   Catherine Lester is a 58 y.o. female.  Patient presents the emergency room complaining of left-sided arm pain secondary to a fall.  The patient states she had a mechanical fall while going up steps on Monday evening.  She states that when the arm is still pain is 6 out of 10 in severity but when she tries to move her upper left arm she has 10 out of 10 severity of pain.  She denies any blood thinners and denies other injuries.  Past medical history significant for alcohol use disorder, hypertension, fibromyalgia, anxiety    Arm Injury Fall       Prior to Admission medications   Medication Sig Start Date End Date Taking? Authorizing Provider  albuterol (PROVENTIL) (2.5 MG/3ML) 0.083% nebulizer solution Take 3 mLs by nebulization every 6 hours as needed for Wheezing or Shortness of Breath 11/17/13   [provider]  aspirin  81 MG chewable tablet Chew by mouth daily.    [provider]  atorvastatin (LIPITOR) 20 MG tablet Take by mouth. Patient not taking: Reported on 07/15/2020    [provider]  cetirizine (ZYRTEC) 10 MG tablet Take 1 tablet by mouth daily. 08/27/18   [provider]  chlorpheniramine-HYDROcodone  (TUSSIONEX PENNKINETIC ER) 10-8 MG/5ML SUER Take 5 mLs by mouth 2 (two) times daily. Patient not taking: Reported on 11/04/2019 11/05/18   Claudene Tanda POUR, PA-C  ciprofloxacin  (CIPRO ) 500 MG tablet Take 1 tablet (500 mg total) by mouth every 12 (twelve) hours. 01/09/23   Henderly, Britni A, PA-C  citalopram  (CELEXA ) 20 MG tablet Take 20 mg by mouth daily. Patient not taking: Reported on 11/04/2019    [provider]  clindamycin  (CLEOCIN -T) 1 % lotion Apply to affected area  QD after shower. Patient not taking: Reported on 07/07/2020 11/04/19   Stewart, Tara, MD   cyclobenzaprine (FLEXERIL) 10 MG tablet Take 1 tablet by mouth 3 (three) times daily as needed. 12/19/22 12/19/23  [provider]  divalproex  (DEPAKOTE  ER) 500 MG 24 hr tablet Take 3 tablets (1,500 mg total) by mouth at bedtime. Patient not taking: Reported on 11/04/2019 02/07/16   Ragena Odor, MD  EUTHYROX  112 MCG tablet Take 112 mcg by mouth every morning. Patient not taking: Reported on 07/07/2020 04/22/20   [provider]  fexofenadine -pseudoephedrine (ALLEGRA-D) 60-120 MG 12 hr tablet Take 1 tablet by mouth 2 (two) times daily. Patient not taking: Reported on 04/28/2021 11/05/18   Claudene Tanda POUR, PA-C  levothyroxine  (SYNTHROID ) 112 MCG tablet Take by mouth. Patient not taking: Reported on 04/28/2021 11/19/20 11/19/21  [provider]  levothyroxine  (SYNTHROID ) 112 MCG tablet Take by mouth. 12/19/22 12/19/23  [provider]  levothyroxine  (SYNTHROID , LEVOTHROID) 125 MCG tablet Take 125 mcg by mouth daily before breakfast.    [provider]  lisinopril  (PRINIVIL ,ZESTRIL ) 20 MG tablet Take 20 mg by mouth daily. Patient not taking: Reported on 04/28/2021    [provider]  lisinopril  (ZESTRIL ) 2.5 MG tablet Take 5 mg by mouth daily. 03/01/21   [provider]  lisinopril  (ZESTRIL ) 5 MG tablet Take by mouth. 12/19/22 12/19/23  [provider]  naproxen  (NAPROSYN ) 500 MG tablet Take 1 tablet (500 mg total) by mouth 2 (two) times daily with a meal. 11/05/18   Claudene Tanda POUR, PA-C  nitrofurantoin ,  macrocrystal-monohydrate, (MACROBID ) 100 MG capsule Take 1 capsule (100 mg total) by mouth 2 (two) times daily. 01/09/23   White, Shelba SAUNDERS, NP  OLANZapine  (ZYPREXA ) 15 MG tablet Take 2 tablets (30 mg total) by mouth at bedtime. Patient not taking: Reported on 11/04/2019 02/07/16   Ragena Odor, MD  ondansetron  (ZOFRAN -ODT) 4 MG disintegrating tablet Take 1 tablet (4 mg total) by mouth every 8 (eight) hours as needed.  01/09/23   Henderly, Britni A, PA-C  rosuvastatin (CRESTOR) 20 MG tablet Take by mouth. 12/19/22 12/19/23  [provider]  sertraline (ZOLOFT) 50 MG tablet Take by mouth. Patient not taking: No sig reported 09/24/19 09/23/20  [provider]  simvastatin  (ZOCOR ) 20 MG tablet Take 1 tablet (20 mg total) by mouth daily at 6 PM. Patient not taking: Reported on 07/07/2020 02/07/16   Ragena Odor, MD  simvastatin  (ZOCOR ) 40 MG tablet Take 40 mg by mouth at bedtime. 04/22/20   [provider]  spironolactone (ALDACTONE) 25 MG tablet Take 1 tablet by mouth daily. Patient not taking: Reported on 07/07/2020 05/11/16   [provider]  Turmeric (QC TUMERIC COMPLEX PO) Take by mouth.    [provider]  vitamin B-12 1000 MCG tablet Take 1 tablet (1,000 mcg total) by mouth daily. Patient not taking: Reported on 11/04/2019 02/07/16   Ragena Odor, MD    Allergies: Ancef [cefazolin], Ceclor [cefaclor], Demerol [meperidine], Erythromycin, and Keflex [cephalexin]    Review of Systems  Updated Vital Signs BP (!) 141/45 (BP Location: Right Arm)   Pulse 83   Temp 98.5 F (36.9 C)   Resp 16   Ht 5' 2 (1.575 m)   Wt 61.2 kg   SpO2 98%   BMI 24.69 kg/m   Physical Exam Vitals and nursing note reviewed.  Constitutional:      General: She is not in acute distress.    Appearance: She is well-developed.  HENT:     Head: Normocephalic and atraumatic.  Eyes:     Conjunctiva/sclera: Conjunctivae normal.  Cardiovascular:     Rate and Rhythm: Normal rate and regular rhythm.     Heart sounds: No murmur heard. Pulmonary:     Effort: Pulmonary effort is normal. No respiratory distress.     Breath sounds: Normal breath sounds.  Abdominal:     Palpations: Abdomen is soft.     Tenderness: There is no abdominal tenderness.  Musculoskeletal:        General: No swelling.     Cervical back: Neck supple.     Comments: Pain the left arm with any  range of motion of the left upper arm. Normal movement from elbow down. Sensation intact. Palpable radial pulse.   Skin:    General: Skin is warm and dry.     Capillary Refill: Capillary refill takes less than 2 seconds.  Neurological:     Mental Status: She is alert.  Psychiatric:        Mood and Affect: Mood normal.     (all labs ordered are listed, but only abnormal results are displayed) Labs Reviewed - No data to display  EKG: None  Radiology: DG Shoulder Left Result Date: 11/01/2023 CLINICAL DATA:  Status post fall EXAM: LEFT SHOULDER - 2+ VIEW COMPARISON:  None Available. FINDINGS: Displaced fracture of the left humeral neck extending to humeral tuberosity. Chip fracture fragment is identified along the medial cortex. There is overlying soft tissue swelling/hematoma. No definite dislocation identified. Visualized thorax is unremarkable. IMPRESSION: Humeral neck  fracture extending to humeral tuberosity with some displacement. Electronically Signed   By: Megan  Zare M.D.   On: 11/01/2023 16:30     .Ortho Injury Treatment  Date/Time: 11/02/2023 2:17 AM  Performed by: Logan Ubaldo NOVAK, PA-C Authorized by: Logan Ubaldo NOVAK, PA-C   Consent:    Consent obtained:  Verbal   Consent given by:  Patient   Risks discussed:  Nerve damage, restricted joint movement, vascular damage and stiffnessInjury location: upper arm Location details: left upper arm Injury type: fracture-dislocation Pre-procedure neurovascular assessment: neurovascularly intact Immobilization: sling Splint Applied by: Kemp Balm Post-procedure neurovascular assessment: post-procedure neurovascularly intact      Medications Ordered in the ED - No data to display                                  Medical Decision Making Risk Prescription drug management.   This patient presents to the ED for concern of left arm pain, this involves an extensive number of treatment options, and is a complaint that carries  with it a high risk of complications and morbidity.  The differential diagnosis includes fracture, dislocation, soft tissue injury, others   Co morbidities / Chronic conditions that complicate the patient evaluation  HTN   Additional history obtained:  Additional history obtained from EMR   Imaging Studies ordered:  I ordered imaging studies including plain films of the left shoulder  I independently visualized and interpreted imaging which showed  Humeral neck fracture extending to humeral tuberosity with some  displacement.     Problem List / ED Course / Critical interventions / Medication management   I ordered medication including Percocet Reevaluation of the patient after these medicines showed that the patient improved    Consultations Obtained:  I requested consultation with the orthopedist,Dr.Bokshan,  and discussed lab and imaging findings as well as pertinent plan - they recommend: Sling with outpatient follow-up   Social Determinants of Health:  Patient is a daily smoker   Test / Admission - Considered:  Patient with fracture of the left humerus.  Patient placed in sling for immobilization.  Patient prescribed short course of Norco.  Discussed with orthopedics who recommends follow-up in the office.  Patient provided contact information.  Return precautions provided.      Final diagnoses:  None    ED Discharge Orders     None          Logan Ubaldo NOVAK DEVONNA 11/02/23 0258    Trine Raynell Moder, MD 11/02/23 [phone]

## 2023-11-02 NOTE — Discharge Instructions (Addendum)
 Your workup was concerning this morning for a fracture of the left upper arm.  Please keep your arm in the sling as provided.  Follow-up with orthopedics.  I have ordered you a short course of pain medication.  Be sure not to exceed 4000 mg in total of acetaminophen  from all sources in a 24-hour time period.  You may also take ibuprofen  for pain.  Return to the emergency department if you develop any life-threatening symptoms.

## 2023-11-02 NOTE — Progress Notes (Signed)
 Orthopedic Tech Progress Note Patient Details:  Zharia Conrow [birth year] [id number]  Ortho Devices Type of Ortho Device: Sling immobilizer Ortho Device/Splint Location: LUE Ortho Device/Splint Interventions: Ordered, Application   Post Interventions Patient Tolerated: Well Instructions Provided: Care of device.   Jermy Couper L Brodie Scovell 11/02/2023, 2:25 AM

## 2023-11-07 ENCOUNTER — Encounter (HOSPITAL_BASED_OUTPATIENT_CLINIC_OR_DEPARTMENT_OTHER): Payer: Self-pay | Admitting: Physician Assistant

## 2023-11-07 ENCOUNTER — Telehealth (HOSPITAL_BASED_OUTPATIENT_CLINIC_OR_DEPARTMENT_OTHER): Payer: Self-pay | Admitting: Physician Assistant

## 2023-11-07 ENCOUNTER — Ambulatory Visit (INDEPENDENT_AMBULATORY_CARE_PROVIDER_SITE_OTHER): Payer: Medicare (Managed Care) | Admitting: Physician Assistant

## 2023-11-07 DIAGNOSIS — M25512 Pain in left shoulder: Secondary | ICD-10-CM | POA: Diagnosis not present

## 2023-11-07 MED ORDER — HYDROCODONE-ACETAMINOPHEN 5-325 MG PO TABS: 1.0000 | ORAL_TABLET | Freq: Four times a day (QID) | ORAL | 0 refills | Status: AC | PRN

## 2023-11-07 NOTE — Progress Notes (Signed)
 Office Visit Note   Patient: Catherine Lester           Date of Birth: 1966-02-19           MRN: [id number] Visit Date: 11/07/2023              Requested by: Delfina Pao, MD [address] [place],  [place] [address] PCP: Delfina Pao, MD   Assessment & Plan: Visit Diagnoses:  1. Acute pain of left shoulder     Plan: Pleasant 58 year old woman comes in today with a 6-day history of left shoulder pain after she fell onto her left shoulder.  X-rays today demonstrate humeral neck fracture with displacement.  She is a smoker question whether this was a osteoporotic fracture.  She is a retired Engineer, civil (consulting).  She is neurologically intact.  Would like to get a CT scan will review with Dr. Genelle will follow-up in 1 week  Follow-Up Instructions: No follow-ups on file.   Orders:  Orders Placed This Encounter  Procedures   CT SHOULDER LEFT WO CONTRAST   Meds ordered this encounter  Medications   HYDROcodone -acetaminophen  (NORCO/VICODIN) 5-325 MG tablet    Sig: Take 1 tablet by mouth every 6 (six) hours as needed.    Dispense:  20 tablet    Refill:  0      Procedures: No procedures performed   Clinical Data: No additional findings.   Subjective: Chief Complaint  Patient presents with   Left Shoulder - Pain    HPI Patient is a pleasant 58 year old who sustained a fall about 6 days ago fell onto her left shoulder was seen and evaluated emergency room diagnosed with a humeral neck fracture with displacement.  Comes in today for evaluation Review of Systems  All other systems reviewed and are negative.    Objective: Vital Signs: There were no vitals taken for this visit.  Physical Exam Constitutional:      Appearance: Normal appearance.  Pulmonary:     Effort: Pulmonary effort is normal.     Breath sounds: Normal breath sounds.  Neurological:     General: No focal deficit present.     Mental Status: She is alert and oriented to person, place, and time.  Psychiatric:         Mood and Affect: Mood normal.        Behavior: Behavior normal.     Ortho Exam Examination she has sensation is intact pulses intact she has good grip strength she is able to extend her elbow.  Resolving ecchymosis compartments are soft and tender  Imaging: No results found.   PMFS History: Patient Active Problem List   Diagnosis Date Noted   Raynaud's disease 04/28/2021   Familial hyperlipidemia 04/28/2021   Anxiety 04/28/2021   Substance induced mood disorder (HCC) 04/28/2021   History of schizoaffective disorder 04/28/2021   Cannabis use disorder, moderate, dependence (HCC) 04/28/2021   Leukocytosis 07/07/2020   Erythrocytosis 07/07/2020   Hx of cervical cancer 06/24/2020   Mixed stress and urge urinary incontinence 04/04/2018   Lumbosacral spondylosis without myelopathy 12/06/2016   Chronic bilateral low back pain 12/06/2016   Failed back syndrome, cervical 12/06/2016   RUQ abdominal pain 08/31/2016   Other chest pain 04/07/2016   Moderate aortic valve insufficiency 04/07/2016   Familial hypercholesterolemia 04/07/2016   Schizoaffective disorder, bipolar type (HCC) 02/07/2016   Alcohol use disorder, severe, dependence (HCC) 01/20/2016   Alcohol withdrawal (HCC) 01/20/2016   Cannabis use disorder, mild, abuse 01/20/2016  Tobacco use disorder 01/20/2016   HTN (hypertension) 01/20/2016   Hypothyroidism 01/20/2016   Opioid use disorder, moderate, dependence (HCC) 01/20/2016   Positive ANA (antinuclear antibody) 07/16/2014   Cough 07/16/2014   Chronic fatigue 07/16/2014   Arthralgia 07/16/2014   Past Medical History:  Diagnosis Date   Anxiety    Arthritis    Fibromyalgia    High cholesterol    Hx of heart valve insufficiency    Hypertension    Thyroid  disease    Urinary incontinence     Family History  Problem Relation Age of Onset   Anxiety disorder Mother    Anxiety disorder Brother     Past Surgical History:  Procedure Laterality Date   ANTERIOR  FUSION CERVICAL SPINE     C5, C6   BACK SURGERY     CHOLECYSTECTOMY     TONSILLECTOMY     TUBAL LIGATION     Social History   Occupational History   Not on file  Tobacco Use   Smoking status: Every Day    Current packs/day: 1.00    Average packs/day: 1 pack/day for 25.0 years (25.0 ttl pk-yrs)    Types: Cigarettes   Smokeless tobacco: Never  Vaping Use   Vaping status: Never Used  Substance and Sexual Activity   Alcohol use: Yes    Comment: occ   Drug use: Not Currently    Types: Marijuana   Sexual activity: Never

## 2023-11-07 NOTE — Telephone Encounter (Signed)
 Patient has just had hydrocodone  refilled and she still should have some left please call [facility] [phone]

## 2023-11-09 ENCOUNTER — Other Ambulatory Visit: Payer: Medicare (Managed Care)

## 2023-11-12 ENCOUNTER — Ambulatory Visit
Admission: RE | Admit: 2023-11-12 | Discharge: 2023-11-12 | Disposition: A | Discharge status: Home / Self Care | Payer: Medicare (Managed Care) | Source: Ambulatory Visit | Attending: Physician Assistant

## 2023-11-12 DIAGNOSIS — M25512 Pain in left shoulder: Secondary | ICD-10-CM

## 2023-11-14 ENCOUNTER — Ambulatory Visit (INDEPENDENT_AMBULATORY_CARE_PROVIDER_SITE_OTHER): Payer: Medicare (Managed Care) | Admitting: Orthopaedic Surgery

## 2023-11-14 ENCOUNTER — Other Ambulatory Visit (HOSPITAL_BASED_OUTPATIENT_CLINIC_OR_DEPARTMENT_OTHER): Payer: Self-pay | Admitting: Orthopaedic Surgery

## 2023-11-14 ENCOUNTER — Telehealth: Payer: Self-pay | Admitting: Orthopaedic Surgery

## 2023-11-14 ENCOUNTER — Other Ambulatory Visit (HOSPITAL_BASED_OUTPATIENT_CLINIC_OR_DEPARTMENT_OTHER): Payer: Self-pay

## 2023-11-14 DIAGNOSIS — M25512 Pain in left shoulder: Secondary | ICD-10-CM | POA: Diagnosis not present

## 2023-11-14 MED ORDER — OXYCODONE HCL 5 MG PO TABS
5.0000 mg | ORAL_TABLET | ORAL | 0 refills | Status: DC | PRN
  Filled 2023-11-14: qty 15, 3d supply, fill #0

## 2023-11-14 MED ORDER — HYDROCODONE-ACETAMINOPHEN 5-325 MG PO TABS
1.0000 | ORAL_TABLET | Freq: Four times a day (QID) | ORAL | 0 refills | Status: AC | PRN
Start: 2023-11-14 — End: ?

## 2023-11-14 NOTE — Telephone Encounter (Signed)
 Patient called and needed the hydrocodone  to go to [facility]. CB#[phone]

## 2023-11-14 NOTE — Addendum Note (Signed)
 Addended by: WOLFGANG CONLEY HERO on: 11/14/2023 12:51 PM   Modules accepted: Orders

## 2023-11-14 NOTE — Progress Notes (Signed)
 Chief Complaint: Left shoulder follow-up     History of Present Illness:    Catherine Lester is a 58 y.o. female presents today with ongoing left shoulder pain in the setting of proximal humerus fracture which is now 2 weeks out.  She does have a history of multiple fractures including of the ankle as well.  She is here today for MRI discussion    PMH/PSH/Family History/Social History/Meds/Allergies:    Past Medical History:  Diagnosis Date   Anxiety    Arthritis    Fibromyalgia    High cholesterol    Hx of heart valve insufficiency    Hypertension    Thyroid  disease    Urinary incontinence    Past Surgical History:  Procedure Laterality Date   ANTERIOR FUSION CERVICAL SPINE     C5, C6   BACK SURGERY     CHOLECYSTECTOMY     TONSILLECTOMY     TUBAL LIGATION     Social History   Socioeconomic History   Marital status: Divorced    Spouse name: Not on file   Number of children: Not on file   Years of education: Not on file   Highest education level: Not on file  Occupational History   Not on file  Tobacco Use   Smoking status: Every Day    Current packs/day: 1.00    Average packs/day: 1 pack/day for 25.0 years (25.0 ttl pk-yrs)    Types: Cigarettes   Smokeless tobacco: Never  Vaping Use   Vaping status: Never Used  Substance and Sexual Activity   Alcohol use: Yes    Comment: occ   Drug use: Not Currently    Types: Marijuana   Sexual activity: Never  Other Topics Concern   Not on file  Social History Narrative   Not on file   Social Drivers of Health   Financial Resource Strain: High Risk (12/19/2022)   Received from [facility]   Overall Financial Resource Strain (CARDIA)    Difficulty of Paying Living Expenses: Hard  Food Insecurity: Food Insecurity Present (12/19/2022)   Received from [facility]   Hunger Vital Sign    Within the past 12 months, you worried that your food would run out before you got the money  to buy more.: Sometimes true    Within the past 12 months, the food you bought just didn't last and you didn't have money to get more.: Never true  Transportation Needs: Unmet Transportation Needs (12/19/2022)   Received from [facility] - Transportation    In the past 12 months, has lack of transportation kept you from medical appointments or from getting medications?: Yes    Lack of Transportation (Non-Medical): No  Physical Activity: Not on file  Stress: Not on file  Social Connections: Not on file   Family History  Problem Relation Age of Onset   Anxiety disorder Mother    Anxiety disorder Brother    Allergies  Allergen Reactions   Ancef [Cefazolin] Hives   Ceclor [Cefaclor] Hives   Demerol [Meperidine] Other (See Comments)    insomnia   Erythromycin Hives   Keflex [Cephalexin] Rash   Current Outpatient Medications  Medication Sig Dispense Refill   oxyCODONE  (ROXICODONE ) 5 MG immediate release tablet Take 1 tablet (5 mg total) by mouth every 4 (four) hours as needed for severe pain (pain score 7-10) or breakthrough pain. 15 tablet 0   albuterol (PROVENTIL) (2.5 MG/3ML)  0.083% nebulizer solution Take 3 mLs by nebulization every 6 hours as needed for Wheezing or Shortness of Breath     aspirin  81 MG chewable tablet Chew by mouth daily.     atorvastatin (LIPITOR) 20 MG tablet Take by mouth. (Patient not taking: Reported on 07/15/2020)     cetirizine (ZYRTEC) 10 MG tablet Take 1 tablet by mouth daily.     chlorpheniramine-HYDROcodone  (TUSSIONEX PENNKINETIC ER) 10-8 MG/5ML SUER Take 5 mLs by mouth 2 (two) times daily. (Patient not taking: Reported on 11/04/2019) 115 mL 0   ciprofloxacin  (CIPRO ) 500 MG tablet Take 1 tablet (500 mg total) by mouth every 12 (twelve) hours. 10 tablet 0   citalopram  (CELEXA ) 20 MG tablet Take 20 mg by mouth daily. (Patient not taking: Reported on 11/04/2019)     clindamycin  (CLEOCIN -T) 1 % lotion Apply to affected area  QD after  shower. (Patient not taking: Reported on 07/07/2020) 60 mL 4   cyclobenzaprine (FLEXERIL) 10 MG tablet Take 1 tablet by mouth 3 (three) times daily as needed.     divalproex  (DEPAKOTE  ER) 500 MG 24 hr tablet Take 3 tablets (1,500 mg total) by mouth at bedtime. (Patient not taking: Reported on 11/04/2019) 90 tablet 0   EUTHYROX  112 MCG tablet Take 112 mcg by mouth every morning. (Patient not taking: Reported on 07/07/2020)     fexofenadine -pseudoephedrine (ALLEGRA-D) 60-120 MG 12 hr tablet Take 1 tablet by mouth 2 (two) times daily. (Patient not taking: Reported on 04/28/2021) 20 tablet 0   HYDROcodone -acetaminophen  (NORCO/VICODIN) 5-325 MG tablet Take 1 tablet by mouth every 6 (six) hours as needed. 20 tablet 0   levothyroxine  (SYNTHROID ) 112 MCG tablet Take by mouth. (Patient not taking: Reported on 04/28/2021)     levothyroxine  (SYNTHROID ) 112 MCG tablet Take by mouth.     levothyroxine  (SYNTHROID , LEVOTHROID) 125 MCG tablet Take 125 mcg by mouth daily before breakfast.     lisinopril  (PRINIVIL ,ZESTRIL ) 20 MG tablet Take 20 mg by mouth daily. (Patient not taking: Reported on 04/28/2021)     lisinopril  (ZESTRIL ) 2.5 MG tablet Take 5 mg by mouth daily.     lisinopril  (ZESTRIL ) 5 MG tablet Take by mouth.     naproxen  (NAPROSYN ) 500 MG tablet Take 1 tablet (500 mg total) by mouth 2 (two) times daily with a meal. 20 tablet 00   nitrofurantoin , macrocrystal-monohydrate, (MACROBID ) 100 MG capsule Take 1 capsule (100 mg total) by mouth 2 (two) times daily. 10 capsule 0   OLANZapine  (ZYPREXA ) 15 MG tablet Take 2 tablets (30 mg total) by mouth at bedtime. (Patient not taking: Reported on 11/04/2019) 60 tablet 0   ondansetron  (ZOFRAN -ODT) 4 MG disintegrating tablet Take 1 tablet (4 mg total) by mouth every 8 (eight) hours as needed. 20 tablet 0   rosuvastatin (CRESTOR) 20 MG tablet Take by mouth.     sertraline (ZOLOFT) 50 MG tablet Take by mouth. (Patient not taking: No sig reported)     simvastatin  (ZOCOR ) 20 MG  tablet Take 1 tablet (20 mg total) by mouth daily at 6 PM. (Patient not taking: Reported on 07/07/2020) 30 tablet    simvastatin  (ZOCOR ) 40 MG tablet Take 40 mg by mouth at bedtime.     spironolactone (ALDACTONE) 25 MG tablet Take 1 tablet by mouth daily. (Patient not taking: Reported on 07/07/2020)     Turmeric (QC TUMERIC COMPLEX PO) Take by mouth.     vitamin B-12 1000 MCG tablet Take 1 tablet (1,000 mcg total) by mouth daily. (Patient  not taking: Reported on 11/04/2019)     No current facility-administered medications for this visit.   No results found.  Review of Systems:   A ROS was performed including pertinent positives and negatives as documented in the HPI.  Physical Exam :   Constitutional: NAD and appears stated age Neurological: Alert and oriented Psych: Appropriate affect and cooperative There were no vitals taken for this visit.   Comprehensive Musculoskeletal Exam:    Left shoulder with bruising and pain.  Range of motion deferred today.  Distal neurosensory exam is intact   Imaging:   Xray (3 views left shoulder, CT scan left shoulder): Part proximal humerus fracture     I personally reviewed and interpreted the radiographs.   Assessment and Plan:   58 y.o. female with left proximal humerus fracture.  Overall doing extremely well.  I did discuss that she does have quite good alignment of the fracture and overall I do believe she would do quite well with nonoperative management.  I did discuss that we would plan for additional 2 weeks of nonweightbearing in a sling.  Following that we will begin active and passive range of motion.  We will plan for home health physical therapy  -I will plan to see her back in 4 weeks for reassessment   I personally saw and evaluated the patient, and participated in the management and treatment plan.  Elspeth Parker, MD Attending Physician, Orthopedic Surgery  This document was dictated using Dragon voice recognition software.  A reasonable attempt at proof reading has been made to minimize errors.

## 2023-11-28 NOTE — Telephone Encounter (Signed)
 Redell with [facility] called. He would like verbal orders for PT 1x wk for 9wks. His cb# [phone]

## 2023-11-29 NOTE — Telephone Encounter (Signed)
LMOM for verbal orders

## 2023-11-30 ENCOUNTER — Telehealth: Payer: Self-pay

## 2023-11-30 ENCOUNTER — Other Ambulatory Visit (HOSPITAL_BASED_OUTPATIENT_CLINIC_OR_DEPARTMENT_OTHER): Payer: Self-pay | Admitting: Orthopaedic Surgery

## 2023-11-30 MED ORDER — TRAMADOL HCL 50 MG PO TABS: 50.0000 mg | ORAL_TABLET | Freq: Four times a day (QID) | ORAL | 0 refills | Status: AC | PRN

## 2023-11-30 NOTE — Telephone Encounter (Signed)
 Patient is asking for some pain medication she is in a lot of pain since she is taking therapy. It is affecting her sleep as well States that Tramadol  works better for her than Oxycodone  or Hydrocodone .  PATIENT USES [facility]

## 2023-12-12 ENCOUNTER — Ambulatory Visit (HOSPITAL_BASED_OUTPATIENT_CLINIC_OR_DEPARTMENT_OTHER)

## 2023-12-12 ENCOUNTER — Ambulatory Visit (HOSPITAL_BASED_OUTPATIENT_CLINIC_OR_DEPARTMENT_OTHER): Admitting: Orthopaedic Surgery

## 2023-12-12 DIAGNOSIS — M25512 Pain in left shoulder: Secondary | ICD-10-CM

## 2023-12-12 NOTE — Progress Notes (Signed)
 Chief Complaint: Left shoulder follow-up     History of Present Illness:    Annarose Ouellet is a 58 y.o. female presents today for follow-up of her left proximal humerus fracture.  Range of motion is coming along slowly but surely.  She is continue to get some pain about the elbow as well as some bursitis in the elbow    PMH/PSH/Family History/Social History/Meds/Allergies:    Past Medical History:  Diagnosis Date   Anxiety    Arthritis    Fibromyalgia    High cholesterol    Hx of heart valve insufficiency    Hypertension    Thyroid  disease    Urinary incontinence    Past Surgical History:  Procedure Laterality Date   ANTERIOR FUSION CERVICAL SPINE     C5, C6   BACK SURGERY     CHOLECYSTECTOMY     TONSILLECTOMY     TUBAL LIGATION     Social History   Socioeconomic History   Marital status: Divorced    Spouse name: Not on file   Number of children: Not on file   Years of education: Not on file   Highest education level: Not on file  Occupational History   Not on file  Tobacco Use   Smoking status: Every Day    Current packs/day: 1.00    Average packs/day: 1 pack/day for 25.0 years (25.0 ttl pk-yrs)    Types: Cigarettes   Smokeless tobacco: Never  Vaping Use   Vaping status: Never Used  Substance and Sexual Activity   Alcohol use: Yes    Comment: occ   Drug use: Not Currently    Types: Marijuana   Sexual activity: Never  Other Topics Concern   Not on file  Social History Narrative   Not on file   Social Drivers of Health   Financial Resource Strain: High Risk (12/19/2022)   Received from [facility]   Overall Financial Resource Strain (CARDIA)    Difficulty of Paying Living Expenses: Hard  Food Insecurity: Food Insecurity Present (12/19/2022)   Received from [facility]   Hunger Vital Sign    Within the past 12 months, you worried that your food would run out before you got the money to buy more.: Sometimes  true    Within the past 12 months, the food you bought just didn't last and you didn't have money to get more.: Never true  Transportation Needs: Unmet Transportation Needs (12/19/2022)   Received from [facility] - Transportation    In the past 12 months, has lack of transportation kept you from medical appointments or from getting medications?: Yes    Lack of Transportation (Non-Medical): No  Physical Activity: Not on file  Stress: Not on file  Social Connections: Not on file   Family History  Problem Relation Age of Onset   Anxiety disorder Mother    Anxiety disorder Brother    Allergies  Allergen Reactions   Ancef [Cefazolin] Hives   Ceclor [Cefaclor] Hives   Demerol [Meperidine] Other (See Comments)    insomnia   Erythromycin Hives   Keflex [Cephalexin] Rash   Current Outpatient Medications  Medication Sig Dispense Refill   traMADol  (ULTRAM ) 50 MG tablet Take 1 tablet (50 mg total) by mouth every 6 (six) hours as needed. 30 tablet 0   albuterol (PROVENTIL) (2.5 MG/3ML) 0.083% nebulizer solution Take 3 mLs by nebulization every 6 hours as needed for Wheezing  or Shortness of Breath     aspirin  81 MG chewable tablet Chew by mouth daily.     atorvastatin (LIPITOR) 20 MG tablet Take by mouth. (Patient not taking: Reported on 07/15/2020)     cetirizine (ZYRTEC) 10 MG tablet Take 1 tablet by mouth daily.     chlorpheniramine-HYDROcodone  (TUSSIONEX PENNKINETIC ER) 10-8 MG/5ML SUER Take 5 mLs by mouth 2 (two) times daily. (Patient not taking: Reported on 11/04/2019) 115 mL 0   ciprofloxacin  (CIPRO ) 500 MG tablet Take 1 tablet (500 mg total) by mouth every 12 (twelve) hours. 10 tablet 0   citalopram  (CELEXA ) 20 MG tablet Take 20 mg by mouth daily. (Patient not taking: Reported on 11/04/2019)     clindamycin  (CLEOCIN -T) 1 % lotion Apply to affected area  QD after shower. (Patient not taking: Reported on 07/07/2020) 60 mL 4   cyclobenzaprine (FLEXERIL) 10 MG tablet  Take 1 tablet by mouth 3 (three) times daily as needed.     divalproex  (DEPAKOTE  ER) 500 MG 24 hr tablet Take 3 tablets (1,500 mg total) by mouth at bedtime. (Patient not taking: Reported on 11/04/2019) 90 tablet 0   EUTHYROX  112 MCG tablet Take 112 mcg by mouth every morning. (Patient not taking: Reported on 07/07/2020)     fexofenadine -pseudoephedrine (ALLEGRA-D) 60-120 MG 12 hr tablet Take 1 tablet by mouth 2 (two) times daily. (Patient not taking: Reported on 04/28/2021) 20 tablet 0   HYDROcodone -acetaminophen  (NORCO/VICODIN) 5-325 MG tablet Take 1 tablet by mouth every 6 (six) hours as needed. 20 tablet 0   HYDROcodone -acetaminophen  (NORCO/VICODIN) 5-325 MG tablet Take 1 tablet by mouth every 6 (six) hours as needed for moderate pain (pain score 4-6). 10 tablet 0   levothyroxine  (SYNTHROID ) 112 MCG tablet Take by mouth. (Patient not taking: Reported on 04/28/2021)     levothyroxine  (SYNTHROID ) 112 MCG tablet Take by mouth.     levothyroxine  (SYNTHROID , LEVOTHROID) 125 MCG tablet Take 125 mcg by mouth daily before breakfast.     lisinopril  (PRINIVIL ,ZESTRIL ) 20 MG tablet Take 20 mg by mouth daily. (Patient not taking: Reported on 04/28/2021)     lisinopril  (ZESTRIL ) 2.5 MG tablet Take 5 mg by mouth daily.     lisinopril  (ZESTRIL ) 5 MG tablet Take by mouth.     naproxen  (NAPROSYN ) 500 MG tablet Take 1 tablet (500 mg total) by mouth 2 (two) times daily with a meal. 20 tablet 00   nitrofurantoin , macrocrystal-monohydrate, (MACROBID ) 100 MG capsule Take 1 capsule (100 mg total) by mouth 2 (two) times daily. 10 capsule 0   OLANZapine  (ZYPREXA ) 15 MG tablet Take 2 tablets (30 mg total) by mouth at bedtime. (Patient not taking: Reported on 11/04/2019) 60 tablet 0   ondansetron  (ZOFRAN -ODT) 4 MG disintegrating tablet Take 1 tablet (4 mg total) by mouth every 8 (eight) hours as needed. 20 tablet 0   rosuvastatin (CRESTOR) 20 MG tablet Take by mouth.     sertraline (ZOLOFT) 50 MG tablet Take by mouth. (Patient not  taking: No sig reported)     simvastatin  (ZOCOR ) 20 MG tablet Take 1 tablet (20 mg total) by mouth daily at 6 PM. (Patient not taking: Reported on 07/07/2020) 30 tablet    simvastatin  (ZOCOR ) 40 MG tablet Take 40 mg by mouth at bedtime.     spironolactone (ALDACTONE) 25 MG tablet Take 1 tablet by mouth daily. (Patient not taking: Reported on 07/07/2020)     Turmeric (QC TUMERIC COMPLEX PO) Take by mouth.     vitamin B-12 1000  MCG tablet Take 1 tablet (1,000 mcg total) by mouth daily. (Patient not taking: Reported on 11/04/2019)     No current facility-administered medications for this visit.   No results found.  Review of Systems:   A ROS was performed including pertinent positives and negatives as documented in the HPI.  Physical Exam :   Constitutional: NAD and appears stated age Neurological: Alert and oriented Psych: Appropriate affect and cooperative There were no vitals taken for this visit.   Comprehensive Musculoskeletal Exam:    Left shoulder with bruising and pain.  Range of motion deferred today.  Distal neurosensory exam is intact   Imaging:   Xray (3 views left shoulder, CT scan left shoulder): 3 Part proximal humerus fracture with increasing callus formation     I personally reviewed and interpreted the radiographs.   Assessment and Plan:   58 y.o. female with left proximal humerus fracture.  Overall doing extremely well.  Active range of motion is improved now with overhead motion to 90 degrees.  I will plan to see her back in 8 weeks for reassessment  -I will plan to see her back in 8 weeks for reassessment   I personally saw and evaluated the patient, and participated in the management and treatment plan.  Elspeth Parker, MD Attending Physician, Orthopedic Surgery  This document was dictated using Dragon voice recognition software. A reasonable attempt at proof reading has been made to minimize errors.

## 2023-12-19 ENCOUNTER — Telehealth (HOSPITAL_BASED_OUTPATIENT_CLINIC_OR_DEPARTMENT_OTHER): Payer: Self-pay | Admitting: Orthopaedic Surgery

## 2023-12-19 ENCOUNTER — Other Ambulatory Visit (HOSPITAL_BASED_OUTPATIENT_CLINIC_OR_DEPARTMENT_OTHER): Payer: Self-pay | Admitting: Orthopaedic Surgery

## 2023-12-19 MED ORDER — TRAMADOL HCL 50 MG PO TABS: 50.0000 mg | ORAL_TABLET | Freq: Four times a day (QID) | ORAL | 1 refills | Status: AC | PRN

## 2023-12-19 NOTE — Telephone Encounter (Signed)
 Patient would like a refill on tramadol  sent to [facility] pharmacy. Also patient stated that her Pt said that she need to go t out patient Pt that would help her better. I suggested that normally the pt would call the office. Patient best contact number [phone]

## 2023-12-20 ENCOUNTER — Ambulatory Visit (INDEPENDENT_AMBULATORY_CARE_PROVIDER_SITE_OTHER): Admitting: Physician Assistant

## 2023-12-20 ENCOUNTER — Encounter: Payer: Self-pay | Admitting: Physician Assistant

## 2023-12-20 VITALS — Ht 62.0 in | Wt 139.0 lb

## 2023-12-20 DIAGNOSIS — M810 Age-related osteoporosis without current pathological fracture: Secondary | ICD-10-CM | POA: Diagnosis not present

## 2023-12-20 NOTE — Progress Notes (Signed)
 Office Visit Note   Patient: Catherine Lester           Date of Birth: [birth year]           MRN: [id number] Visit Date: 12/20/2023              Requested by: Delfina Pao, MD [address] [place],  [place] [address] PCP: Delfina Pao, MD   Assessment & Plan: Visit Diagnoses:  1. Age-related osteoporosis without current pathological fracture     Plan: Catherine Lester is a pleasant 58 year old woman who comes in referral from Dr. Genelle for evaluation of osteoporosis.  She does have a history of humerus fracture.  Her x-rays have demonstrated osteoporosis.  She has never taken medication for osteoporosis in the past.  She has no history of heart attack or stroke.  She does have a history of coronary artery disease but has not had any complications.  She has a history of cervical cancer.  No history of kidney disease ulcers gastric bypass does not have severe reflux no history of seizures.  She was 58 years old when she went through menopause.  She admits she does not take any calcium she really does not like milk products.  She takes vitamin D in the winter.  She is a current smoker for 25 years 1 pack a day.  She occasionally has an alcoholic beverage and she walks a couple times a week.  She does have a family history of her mom having lumbar spine fractures.  No issues with dental work.  By definition I believe she has osteoporosis.  She is going to her primary care for her annual exam and is getting labs there I have given her prescription to get appropriate osteoporosis labs.  I would like to see her back in a couple weeks after those labs are complete.  We have also ordered a bone density scan.  Certainly her cigarette smoking has a factor and she understands this.  We discussed calcium intake and how nondairy foods can also be calcium rich.  Like her to try and track her calcium.  I given her information about this.  With regards to exercise we discussed doing some resistance training a couple  times a week.  I told her that general idea of the medication however until I see her labs that would be more indicative.  She is quite young and certainly an anabolic medication might be appropriate 45 minutes was spent reviewing her chart and talking with her and answering questions  Follow-Up Instructions: Return in about 2 weeks (around 01/03/2024).   Orders:  Orders Placed This Encounter  Procedures   DG BONE DENSITY (DXA)   No orders of the defined types were placed in this encounter.     Procedures: No procedures performed   Clinical Data: No additional findings.   Subjective: No chief complaint on file.   HPI Patient is a pleasant 58 year old woman that is referred by Dr. Genelle for evaluation of osteoporosis she is currently being treated for a humerus fracture Review of Systems  All other systems reviewed and are negative.    Objective: Vital Signs: Ht 5' 2 (1.575 m)   Wt 139 lb (63 kg)   BMI 25.42 kg/m   Physical Exam Constitutional:      Appearance: Normal appearance.  Pulmonary:     Effort: Pulmonary effort is normal.     Breath sounds: Normal breath sounds.  Skin:    General: Skin is warm  and dry.  Neurological:     General: No focal deficit present.     Mental Status: She is alert.  Psychiatric:        Mood and Affect: Mood normal.        Behavior: Behavior normal.       Specialty Comments:  No specialty comments available.  Imaging: No results found.   PMFS History: Patient Active Problem List   Diagnosis Date Noted   Age-related osteoporosis without current pathological fracture 12/20/2023   Raynaud's disease 04/28/2021   Familial hyperlipidemia 04/28/2021   Anxiety 04/28/2021   Substance induced mood disorder (HCC) 04/28/2021   History of schizoaffective disorder 04/28/2021   Cannabis use disorder, moderate, dependence (HCC) 04/28/2021   Leukocytosis 07/07/2020   Erythrocytosis 07/07/2020   Hx of cervical cancer  06/24/2020   Mixed stress and urge urinary incontinence 04/04/2018   Lumbosacral spondylosis without myelopathy 12/06/2016   Chronic bilateral low back pain 12/06/2016   Failed back syndrome, cervical 12/06/2016   RUQ abdominal pain 08/31/2016   Other chest pain 04/07/2016   Moderate aortic valve insufficiency 04/07/2016   Familial hypercholesterolemia 04/07/2016   Schizoaffective disorder, bipolar type (HCC) 02/07/2016   Alcohol use disorder, severe, dependence (HCC) 01/20/2016   Alcohol withdrawal (HCC) 01/20/2016   Cannabis use disorder, mild, abuse 01/20/2016   Tobacco use disorder 01/20/2016   HTN (hypertension) 01/20/2016   Hypothyroidism 01/20/2016   Opioid use disorder, moderate, dependence (HCC) 01/20/2016   Positive ANA (antinuclear antibody) 07/16/2014   Cough 07/16/2014   Chronic fatigue 07/16/2014   Arthralgia 07/16/2014   Past Medical History:  Diagnosis Date   Anxiety    Arthritis    Fibromyalgia    High cholesterol    Hx of heart valve insufficiency    Hypertension    Thyroid  disease    Urinary incontinence     Family History  Problem Relation Age of Onset   Anxiety disorder Mother    Anxiety disorder Brother     Past Surgical History:  Procedure Laterality Date   ANTERIOR FUSION CERVICAL SPINE     C5, C6   BACK SURGERY     CHOLECYSTECTOMY     TONSILLECTOMY     TUBAL LIGATION     Social History   Occupational History   Not on file  Tobacco Use   Smoking status: Every Day    Current packs/day: 1.00    Average packs/day: 1 pack/day for 25.0 years (25.0 ttl pk-yrs)    Types: Cigarettes   Smokeless tobacco: Never  Vaping Use   Vaping status: Never Used  Substance and Sexual Activity   Alcohol use: Yes    Comment: occ   Drug use: Not Currently    Types: Marijuana   Sexual activity: Never

## 2023-12-25 LAB — LAB REPORT - SCANNED
A1c: 5.4
EGFR: 74

## 2024-01-18 ENCOUNTER — Other Ambulatory Visit: Payer: Self-pay | Admitting: Pediatrics

## 2024-01-18 DIAGNOSIS — F17210 Nicotine dependence, cigarettes, uncomplicated: Secondary | ICD-10-CM

## 2024-01-18 DIAGNOSIS — Z122 Encounter for screening for malignant neoplasm of respiratory organs: Secondary | ICD-10-CM

## 2024-01-21 ENCOUNTER — Encounter: Payer: Self-pay | Admitting: Radiology

## 2024-02-01 ENCOUNTER — Ambulatory Visit
Admission: RE | Admit: 2024-02-01 | Discharge: 2024-02-01 | Disposition: A | Discharge status: Home / Self Care | Source: Ambulatory Visit | Attending: Pediatrics | Admitting: Pediatrics

## 2024-02-01 ENCOUNTER — Ambulatory Visit
Admission: RE | Admit: 2024-02-01 | Discharge: 2024-02-01 | Disposition: A | Discharge status: Home / Self Care | Source: Ambulatory Visit | Attending: Obstetrics and Gynecology | Admitting: Obstetrics and Gynecology

## 2024-02-01 DIAGNOSIS — Z122 Encounter for screening for malignant neoplasm of respiratory organs: Secondary | ICD-10-CM | POA: Diagnosis present

## 2024-02-01 DIAGNOSIS — F17210 Nicotine dependence, cigarettes, uncomplicated: Secondary | ICD-10-CM | POA: Insufficient documentation

## 2024-02-01 DIAGNOSIS — Z1231 Encounter for screening mammogram for malignant neoplasm of breast: Secondary | ICD-10-CM | POA: Diagnosis present

## 2024-02-05 ENCOUNTER — Other Ambulatory Visit: Payer: Self-pay | Admitting: *Deleted

## 2024-02-05 ENCOUNTER — Inpatient Hospital Stay
Admission: RE | Admit: 2024-02-05 | Discharge: 2024-02-05 | Disposition: A | Payer: Self-pay | Source: Ambulatory Visit | Attending: Pediatrics | Admitting: Pediatrics

## 2024-02-05 ENCOUNTER — Inpatient Hospital Stay
Admission: RE | Admit: 2024-02-05 | Discharge: 2024-02-05 | Disposition: A | Discharge status: Home / Self Care | Payer: Self-pay | Source: Ambulatory Visit | Attending: Pediatrics | Admitting: Pediatrics

## 2024-02-05 DIAGNOSIS — Z1231 Encounter for screening mammogram for malignant neoplasm of breast: Secondary | ICD-10-CM

## 2024-02-06 ENCOUNTER — Ambulatory Visit (INDEPENDENT_AMBULATORY_CARE_PROVIDER_SITE_OTHER): Admitting: Orthopaedic Surgery

## 2024-02-06 ENCOUNTER — Other Ambulatory Visit (HOSPITAL_BASED_OUTPATIENT_CLINIC_OR_DEPARTMENT_OTHER): Payer: Self-pay

## 2024-02-06 ENCOUNTER — Ambulatory Visit (HOSPITAL_BASED_OUTPATIENT_CLINIC_OR_DEPARTMENT_OTHER)

## 2024-02-06 DIAGNOSIS — M25512 Pain in left shoulder: Secondary | ICD-10-CM

## 2024-02-06 MED ORDER — LIDOCAINE HCL 1 % IJ SOLN
4.0000 mL | INTRAMUSCULAR | Status: AC | PRN
  Administered 2024-02-06: 4 mL

## 2024-02-06 MED ORDER — TRIAMCINOLONE ACETONIDE 40 MG/ML IJ SUSP
80.0000 mg | INTRAMUSCULAR | Status: AC | PRN
  Administered 2024-02-06: 80 mg via INTRA_ARTICULAR

## 2024-02-06 NOTE — Progress Notes (Signed)
 Chief Complaint: Left shoulder follow-up     History of Present Illness:    Catherine Lester is a 58 y.o. female presents today for follow-up of her left proximal humerus fracture.  Overall she does feel little bit stiff in the left shoulder although range of motion is coming along nicely    PMH/PSH/Family History/Social History/Meds/Allergies:    Past Medical History:  Diagnosis Date   Anxiety    Arthritis    Fibromyalgia    High cholesterol    Hx of heart valve insufficiency    Hypertension    Thyroid  disease    Urinary incontinence    Past Surgical History:  Procedure Laterality Date   ANTERIOR FUSION CERVICAL SPINE     C5, C6   BACK SURGERY     CHOLECYSTECTOMY     TONSILLECTOMY     TUBAL LIGATION     Social History   Socioeconomic History   Marital status: Divorced    Spouse name: Not on file   Number of children: Not on file   Years of education: Not on file   Highest education level: Not on file  Occupational History   Not on file  Tobacco Use   Smoking status: Every Day    Current packs/day: 1.00    Average packs/day: 1 pack/day for 25.0 years (25.0 ttl pk-yrs)    Types: Cigarettes   Smokeless tobacco: Never  Vaping Use   Vaping status: Never Used  Substance and Sexual Activity   Alcohol use: Yes    Comment: occ   Drug use: Not Currently    Types: Marijuana   Sexual activity: Never  Other Topics Concern   Not on file  Social History Narrative   Not on file   Social Drivers of Health   Financial Resource Strain: Medium Risk (12/22/2023)   Received from [facility]   Overall Financial Resource Strain (CARDIA)    Difficulty of Paying Living Expenses: Somewhat hard  Food Insecurity: Food Insecurity Present (12/22/2023)   Received from [facility]   Hunger Vital Sign    Within the past 12 months, you worried that your food would run out before you got the money to buy more.: Sometimes true    Within the  past 12 months, the food you bought just didn't last and you didn't have money to get more.: Sometimes true  Transportation Needs: No Transportation Needs (12/22/2023)   Received from [facility] - Transportation    In the past 12 months, has lack of transportation kept you from medical appointments or from getting medications?: No    Lack of Transportation (Non-Medical): No  Physical Activity: Insufficiently Active (01/02/2024)   Received from [facility]   Exercise Vital Sign    On average, how many minutes do you engage in exercise at this level?: 30 min    On average, how many days per week do you engage in moderate to strenuous exercise (like a brisk walk)?: 2 days  Stress: Stress Concern Present (01/02/2024)   Received from [facility] - Occupational Stress Questionnaire    Feeling of Stress : To some extent  Social Connections: Moderately Isolated (01/02/2024)   Received from [facility]   Social Connection and Isolation Panel    In a typical week, how many times do you talk on the phone with family, friends, or neighbors?:  More than three times a week    How often do you get together with friends or relatives?: Three times a week    How often do you attend church or religious services?: 1 to 4 times per year    Do you belong to any clubs or organizations such as church groups, unions, fraternal or athletic groups, or school groups?: No    How often do you attend meetings of the clubs or organizations you belong to?: Never    Are you married, widowed, divorced, separated, never married, or living with a partner?: Divorced   Family History  Problem Relation Age of Onset   Anxiety disorder Mother    Anxiety disorder Brother    Allergies  Allergen Reactions   Ancef [Cefazolin] Hives   Ceclor [Cefaclor] Hives   Demerol [Meperidine] Other (See Comments)     insomnia   Erythromycin Hives   Keflex [Cephalexin] Rash   Current Outpatient Medications  Medication Sig Dispense Refill   traMADol  (ULTRAM ) 50 MG tablet Take 1 tablet (50 mg total) by mouth every 6 (six) hours as needed. 30 tablet 0   traMADol  (ULTRAM ) 50 MG tablet Take 1 tablet (50 mg total) by mouth every 6 (six) hours as needed. 30 tablet 1   albuterol (PROVENTIL) (2.5 MG/3ML) 0.083% nebulizer solution Take 3 mLs by nebulization every 6 hours as needed for Wheezing or Shortness of Breath     aspirin  81 MG chewable tablet Chew by mouth daily.     atorvastatin (LIPITOR) 20 MG tablet Take by mouth. (Patient not taking: Reported on 07/15/2020)     cetirizine (ZYRTEC) 10 MG tablet Take 1 tablet by mouth daily.     chlorpheniramine-HYDROcodone  (TUSSIONEX PENNKINETIC ER) 10-8 MG/5ML SUER Take 5 mLs by mouth 2 (two) times daily. (Patient not taking: Reported on 11/04/2019) 115 mL 0   ciprofloxacin  (CIPRO ) 500 MG tablet Take 1 tablet (500 mg total) by mouth every 12 (twelve) hours. 10 tablet 0   citalopram  (CELEXA ) 20 MG tablet Take 20 mg by mouth daily. (Patient not taking: Reported on 11/04/2019)     clindamycin  (CLEOCIN -T) 1 % lotion Apply to affected area  QD after shower. (Patient not taking: Reported on 07/07/2020) 60 mL 4   divalproex  (DEPAKOTE  ER) 500 MG 24 hr tablet Take 3 tablets (1,500 mg total) by mouth at bedtime. (Patient not taking: Reported on 11/04/2019) 90 tablet 0   EUTHYROX  112 MCG tablet Take 112 mcg by mouth every morning. (Patient not taking: Reported on 07/07/2020)     fexofenadine -pseudoephedrine (ALLEGRA-D) 60-120 MG 12 hr tablet Take 1 tablet by mouth 2 (two) times daily. (Patient not taking: Reported on 04/28/2021) 20 tablet 0   HYDROcodone -acetaminophen  (NORCO/VICODIN) 5-325 MG tablet Take 1 tablet by mouth every 6 (six) hours as needed. 20 tablet 0   HYDROcodone -acetaminophen  (NORCO/VICODIN) 5-325 MG tablet Take 1 tablet by mouth every 6 (six) hours as needed for moderate pain  (pain score 4-6). 10 tablet 0   levothyroxine  (SYNTHROID ) 112 MCG tablet Take by mouth. (Patient not taking: Reported on 04/28/2021)     levothyroxine  (SYNTHROID ) 112 MCG tablet Take by mouth.     levothyroxine  (SYNTHROID , LEVOTHROID) 125 MCG tablet Take 125 mcg by mouth daily before breakfast.     lisinopril  (PRINIVIL ,ZESTRIL ) 20 MG tablet Take 20 mg by mouth daily. (Patient not taking: Reported on 04/28/2021)     lisinopril  (ZESTRIL ) 2.5 MG tablet Take 5 mg by mouth daily.     lisinopril  (ZESTRIL ) 5 MG  tablet Take by mouth.     naproxen  (NAPROSYN ) 500 MG tablet Take 1 tablet (500 mg total) by mouth 2 (two) times daily with a meal. 20 tablet 00   nitrofurantoin , macrocrystal-monohydrate, (MACROBID ) 100 MG capsule Take 1 capsule (100 mg total) by mouth 2 (two) times daily. 10 capsule 0   OLANZapine  (ZYPREXA ) 15 MG tablet Take 2 tablets (30 mg total) by mouth at bedtime. (Patient not taking: Reported on 11/04/2019) 60 tablet 0   ondansetron  (ZOFRAN -ODT) 4 MG disintegrating tablet Take 1 tablet (4 mg total) by mouth every 8 (eight) hours as needed. 20 tablet 0   rosuvastatin (CRESTOR) 20 MG tablet Take by mouth.     sertraline (ZOLOFT) 50 MG tablet Take by mouth. (Patient not taking: No sig reported)     simvastatin  (ZOCOR ) 20 MG tablet Take 1 tablet (20 mg total) by mouth daily at 6 PM. (Patient not taking: Reported on 07/07/2020) 30 tablet    simvastatin  (ZOCOR ) 40 MG tablet Take 40 mg by mouth at bedtime.     spironolactone (ALDACTONE) 25 MG tablet Take 1 tablet by mouth daily. (Patient not taking: Reported on 07/07/2020)     Turmeric (QC TUMERIC COMPLEX PO) Take by mouth.     vitamin B-12 1000 MCG tablet Take 1 tablet (1,000 mcg total) by mouth daily. (Patient not taking: Reported on 11/04/2019)     No current facility-administered medications for this visit.   MM Outside Films Mammo Result Date: 02/05/2024 This examination belongs to an outside facility and is stored here for comparison purposes  only.  Contact the originating outside institution for any associated report or interpretation.  MM Outside Films Mammo Result Date: 02/05/2024 This examination belongs to an outside facility and is stored here for comparison purposes only.  Contact the originating outside institution for any associated report or interpretation.   Review of Systems:   A ROS was performed including pertinent positives and negatives as documented in the HPI.  Physical Exam :   Constitutional: NAD and appears stated age Neurological: Alert and oriented Psych: Appropriate affect and cooperative There were no vitals taken for this visit.   Comprehensive Musculoskeletal Exam:    Left shoulder with bruising and pain.  Range of motion deferred today.  Distal neurosensory exam is intact   Imaging:   Xray (3 views left shoulder, CT scan left shoulder): 3 Part proximal humerus fracture with increasing callus formation     I personally reviewed and interpreted the radiographs.   Assessment and Plan:   58 y.o. female with left proximal humerus fracture.  Overall doing extremely well.  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  Will plan for an ultrasound-guided injection of the glenohumeral joint I will see her back as needed  - Left shoulder glenohumeral injection provided verbal consent obtained    Procedure Note  Patient: Emilynn Srinivasan             Date of Birth: 03/21/1965           MRN: [id number]             Visit Date: 02/06/2024  Procedures: Visit Diagnoses:  1. Acute pain of left shoulder     Large Joint Inj: L glenohumeral on 02/06/2024 12:44 PM Indications: pain Details: 22 G 1.5 in needle, ultrasound-guided anterior approach  Arthrogram: No  Medications: 4 mL lidocaine  1 %; 80 mg triamcinolone  acetonide 40 MG/ML Outcome:  tolerated well, no immediate complications Procedure, treatment  alternatives, risks and benefits explained, specific risks discussed. Consent was given by the patient. Immediately prior to procedure a time out was called to verify the correct patient, procedure, equipment, support staff and site/side marked as required. Patient was prepped and draped in the usual sterile fashion.         I personally saw and evaluated the patient, and participated in the management and treatment plan.  Elspeth Parker, MD Attending Physician, Orthopedic Surgery  This document was dictated using Dragon voice recognition software. A reasonable attempt at proof reading has been made to minimize errors.

## 2024-02-11 ENCOUNTER — Encounter (HOSPITAL_BASED_OUTPATIENT_CLINIC_OR_DEPARTMENT_OTHER): Payer: Self-pay | Admitting: Orthopaedic Surgery
